# Patient Record
Sex: Female | Born: 1950
Health system: Southern US, Community
[De-identification: ages and names within clinical notes are randomized; demographics above are authoritative.]

## PROBLEM LIST (undated history)

## (undated) DIAGNOSIS — B1910 Unspecified viral hepatitis B without hepatic coma: Secondary | ICD-10-CM

## (undated) DIAGNOSIS — R011 Cardiac murmur, unspecified: Secondary | ICD-10-CM

## (undated) DIAGNOSIS — R0989 Other specified symptoms and signs involving the circulatory and respiratory systems: Secondary | ICD-10-CM

## (undated) DIAGNOSIS — E785 Hyperlipidemia, unspecified: Secondary | ICD-10-CM

## (undated) DIAGNOSIS — Z5189 Encounter for other specified aftercare: Secondary | ICD-10-CM

## (undated) DIAGNOSIS — M1990 Unspecified osteoarthritis, unspecified site: Secondary | ICD-10-CM

## (undated) DIAGNOSIS — B019 Varicella without complication: Secondary | ICD-10-CM

## (undated) DIAGNOSIS — T7840XA Allergy, unspecified, initial encounter: Secondary | ICD-10-CM

## (undated) DIAGNOSIS — Z9889 Other specified postprocedural states: Secondary | ICD-10-CM

## (undated) DIAGNOSIS — S62121A Displaced fracture of lunate [semilunar], right wrist, initial encounter for closed fracture: Secondary | ICD-10-CM

## (undated) DIAGNOSIS — A6000 Herpesviral infection of urogenital system, unspecified: Secondary | ICD-10-CM

## (undated) DIAGNOSIS — F1910 Other psychoactive substance abuse, uncomplicated: Secondary | ICD-10-CM

## (undated) DIAGNOSIS — K219 Gastro-esophageal reflux disease without esophagitis: Secondary | ICD-10-CM

## (undated) DIAGNOSIS — K859 Acute pancreatitis without necrosis or infection, unspecified: Secondary | ICD-10-CM

## (undated) DIAGNOSIS — N200 Calculus of kidney: Secondary | ICD-10-CM

## (undated) DIAGNOSIS — E039 Hypothyroidism, unspecified: Secondary | ICD-10-CM

## (undated) DIAGNOSIS — H269 Unspecified cataract: Secondary | ICD-10-CM

## (undated) DIAGNOSIS — R112 Nausea with vomiting, unspecified: Secondary | ICD-10-CM

## (undated) HISTORY — DX: Calculus of kidney: N20.0

## (undated) HISTORY — DX: Gastro-esophageal reflux disease without esophagitis: K21.9

## (undated) HISTORY — DX: Hyperlipidemia, unspecified: E78.5

## (undated) HISTORY — PX: OVARIAN CYST SURGERY: SHX726

## (undated) HISTORY — PX: OTHER SURGICAL HISTORY: SHX169

## (undated) HISTORY — DX: Hypothyroidism, unspecified: E03.9

## (undated) HISTORY — DX: Herpesviral infection of urogenital system, unspecified: A60.00

## (undated) HISTORY — DX: Encounter for other specified aftercare: Z51.89

## (undated) HISTORY — DX: Unspecified osteoarthritis, unspecified site: M19.90

## (undated) HISTORY — DX: Acute pancreatitis without necrosis or infection, unspecified: K85.90

## (undated) HISTORY — PX: FRACTURE SURGERY: SHX138

## (undated) HISTORY — DX: Cardiac murmur, unspecified: R01.1

## (undated) HISTORY — PX: LAPAROSCOPY: SHX197

## (undated) HISTORY — DX: Unspecified viral hepatitis B without hepatic coma: B19.10

## (undated) HISTORY — DX: Unspecified cataract: H26.9

## (undated) HISTORY — DX: Other specified symptoms and signs involving the circulatory and respiratory systems: R09.89

## (undated) HISTORY — PX: EYE SURGERY: SHX253

## (undated) HISTORY — DX: Other psychoactive substance abuse, uncomplicated: F19.10

## (undated) HISTORY — PX: TONSILLECTOMY: SUR1361

## (undated) HISTORY — DX: Allergy, unspecified, initial encounter: T78.40XA

## (undated) HISTORY — DX: Varicella without complication: B01.9

---

## 1970-05-15 HISTORY — PX: APPENDECTOMY: SHX54

## 2015-05-16 DIAGNOSIS — K5730 Diverticulosis of large intestine without perforation or abscess without bleeding: Secondary | ICD-10-CM

## 2015-05-16 HISTORY — DX: Diverticulosis of large intestine without perforation or abscess without bleeding: K57.30

## 2015-05-24 DIAGNOSIS — R110 Nausea: Secondary | ICD-10-CM | POA: Diagnosis not present

## 2015-05-24 DIAGNOSIS — R1011 Right upper quadrant pain: Secondary | ICD-10-CM | POA: Diagnosis not present

## 2015-06-16 DIAGNOSIS — M25511 Pain in right shoulder: Secondary | ICD-10-CM | POA: Diagnosis not present

## 2015-06-16 DIAGNOSIS — G8929 Other chronic pain: Secondary | ICD-10-CM | POA: Diagnosis not present

## 2015-07-01 DIAGNOSIS — M25511 Pain in right shoulder: Secondary | ICD-10-CM | POA: Diagnosis not present

## 2015-07-07 DIAGNOSIS — M25511 Pain in right shoulder: Secondary | ICD-10-CM | POA: Diagnosis not present

## 2015-07-12 DIAGNOSIS — M25511 Pain in right shoulder: Secondary | ICD-10-CM | POA: Diagnosis not present

## 2015-07-22 DIAGNOSIS — G8929 Other chronic pain: Secondary | ICD-10-CM | POA: Diagnosis not present

## 2015-07-22 DIAGNOSIS — M25511 Pain in right shoulder: Secondary | ICD-10-CM | POA: Diagnosis not present

## 2015-07-23 DIAGNOSIS — N390 Urinary tract infection, site not specified: Secondary | ICD-10-CM | POA: Diagnosis not present

## 2015-07-28 DIAGNOSIS — G8929 Other chronic pain: Secondary | ICD-10-CM | POA: Diagnosis not present

## 2015-07-28 DIAGNOSIS — M25511 Pain in right shoulder: Secondary | ICD-10-CM | POA: Diagnosis not present

## 2015-08-04 DIAGNOSIS — M25511 Pain in right shoulder: Secondary | ICD-10-CM | POA: Diagnosis not present

## 2015-08-04 DIAGNOSIS — G8929 Other chronic pain: Secondary | ICD-10-CM | POA: Diagnosis not present

## 2015-08-06 DIAGNOSIS — R3129 Other microscopic hematuria: Secondary | ICD-10-CM | POA: Diagnosis not present

## 2015-08-06 DIAGNOSIS — H25813 Combined forms of age-related cataract, bilateral: Secondary | ICD-10-CM | POA: Diagnosis not present

## 2015-08-06 DIAGNOSIS — N390 Urinary tract infection, site not specified: Secondary | ICD-10-CM | POA: Diagnosis not present

## 2015-08-06 DIAGNOSIS — H3540 Unspecified peripheral retinal degeneration: Secondary | ICD-10-CM | POA: Diagnosis not present

## 2015-08-11 DIAGNOSIS — M25511 Pain in right shoulder: Secondary | ICD-10-CM | POA: Diagnosis not present

## 2015-08-11 DIAGNOSIS — G8929 Other chronic pain: Secondary | ICD-10-CM | POA: Diagnosis not present

## 2015-08-12 DIAGNOSIS — N200 Calculus of kidney: Secondary | ICD-10-CM | POA: Diagnosis not present

## 2015-08-12 DIAGNOSIS — K5730 Diverticulosis of large intestine without perforation or abscess without bleeding: Secondary | ICD-10-CM | POA: Diagnosis not present

## 2015-08-12 DIAGNOSIS — I7090 Unspecified atherosclerosis: Secondary | ICD-10-CM | POA: Diagnosis not present

## 2015-08-12 DIAGNOSIS — R918 Other nonspecific abnormal finding of lung field: Secondary | ICD-10-CM | POA: Diagnosis not present

## 2015-08-12 DIAGNOSIS — R319 Hematuria, unspecified: Secondary | ICD-10-CM | POA: Diagnosis not present

## 2015-08-16 DIAGNOSIS — H25813 Combined forms of age-related cataract, bilateral: Secondary | ICD-10-CM | POA: Diagnosis not present

## 2015-08-19 DIAGNOSIS — R3129 Other microscopic hematuria: Secondary | ICD-10-CM | POA: Diagnosis not present

## 2015-08-19 DIAGNOSIS — N390 Urinary tract infection, site not specified: Secondary | ICD-10-CM | POA: Diagnosis not present

## 2015-08-26 DIAGNOSIS — H259 Unspecified age-related cataract: Secondary | ICD-10-CM | POA: Diagnosis not present

## 2015-08-26 DIAGNOSIS — Z01812 Encounter for preprocedural laboratory examination: Secondary | ICD-10-CM | POA: Diagnosis not present

## 2015-08-26 DIAGNOSIS — Z01818 Encounter for other preprocedural examination: Secondary | ICD-10-CM | POA: Diagnosis not present

## 2015-08-26 DIAGNOSIS — H25813 Combined forms of age-related cataract, bilateral: Secondary | ICD-10-CM | POA: Diagnosis not present

## 2015-09-01 DIAGNOSIS — Z91030 Bee allergy status: Secondary | ICD-10-CM | POA: Diagnosis not present

## 2015-09-01 DIAGNOSIS — N200 Calculus of kidney: Secondary | ICD-10-CM | POA: Diagnosis not present

## 2015-09-01 DIAGNOSIS — Z91041 Radiographic dye allergy status: Secondary | ICD-10-CM | POA: Diagnosis not present

## 2015-09-01 DIAGNOSIS — Z8619 Personal history of other infectious and parasitic diseases: Secondary | ICD-10-CM | POA: Diagnosis not present

## 2015-09-01 DIAGNOSIS — E785 Hyperlipidemia, unspecified: Secondary | ICD-10-CM | POA: Diagnosis not present

## 2015-09-01 DIAGNOSIS — Z79899 Other long term (current) drug therapy: Secondary | ICD-10-CM | POA: Diagnosis not present

## 2015-09-01 DIAGNOSIS — K219 Gastro-esophageal reflux disease without esophagitis: Secondary | ICD-10-CM | POA: Diagnosis not present

## 2015-09-01 DIAGNOSIS — E039 Hypothyroidism, unspecified: Secondary | ICD-10-CM | POA: Diagnosis not present

## 2015-09-01 DIAGNOSIS — H269 Unspecified cataract: Secondary | ICD-10-CM | POA: Diagnosis not present

## 2015-09-01 DIAGNOSIS — H25813 Combined forms of age-related cataract, bilateral: Secondary | ICD-10-CM | POA: Diagnosis not present

## 2015-09-02 DIAGNOSIS — Z48810 Encounter for surgical aftercare following surgery on the sense organs: Secondary | ICD-10-CM | POA: Diagnosis not present

## 2015-09-08 DIAGNOSIS — Z79899 Other long term (current) drug therapy: Secondary | ICD-10-CM | POA: Diagnosis not present

## 2015-09-08 DIAGNOSIS — N200 Calculus of kidney: Secondary | ICD-10-CM | POA: Diagnosis not present

## 2015-09-08 DIAGNOSIS — Z780 Asymptomatic menopausal state: Secondary | ICD-10-CM | POA: Diagnosis not present

## 2015-09-08 DIAGNOSIS — Z91041 Radiographic dye allergy status: Secondary | ICD-10-CM | POA: Diagnosis not present

## 2015-09-08 DIAGNOSIS — K219 Gastro-esophageal reflux disease without esophagitis: Secondary | ICD-10-CM | POA: Diagnosis not present

## 2015-09-08 DIAGNOSIS — H269 Unspecified cataract: Secondary | ICD-10-CM | POA: Diagnosis not present

## 2015-09-08 DIAGNOSIS — E785 Hyperlipidemia, unspecified: Secondary | ICD-10-CM | POA: Diagnosis not present

## 2015-09-08 DIAGNOSIS — E039 Hypothyroidism, unspecified: Secondary | ICD-10-CM | POA: Diagnosis not present

## 2015-09-08 DIAGNOSIS — H25812 Combined forms of age-related cataract, left eye: Secondary | ICD-10-CM | POA: Diagnosis not present

## 2015-09-08 DIAGNOSIS — Z91030 Bee allergy status: Secondary | ICD-10-CM | POA: Diagnosis not present

## 2015-09-09 DIAGNOSIS — Z48810 Encounter for surgical aftercare following surgery on the sense organs: Secondary | ICD-10-CM | POA: Diagnosis not present

## 2015-09-16 DIAGNOSIS — Z48810 Encounter for surgical aftercare following surgery on the sense organs: Secondary | ICD-10-CM | POA: Diagnosis not present

## 2015-10-14 DIAGNOSIS — Z48810 Encounter for surgical aftercare following surgery on the sense organs: Secondary | ICD-10-CM | POA: Diagnosis not present

## 2015-10-25 DIAGNOSIS — Z1159 Encounter for screening for other viral diseases: Secondary | ICD-10-CM | POA: Diagnosis not present

## 2015-10-25 DIAGNOSIS — Z136 Encounter for screening for cardiovascular disorders: Secondary | ICD-10-CM | POA: Diagnosis not present

## 2015-10-25 DIAGNOSIS — Z0000 Encounter for general adult medical examination without abnormal findings: Secondary | ICD-10-CM | POA: Diagnosis not present

## 2015-11-01 DIAGNOSIS — Z1159 Encounter for screening for other viral diseases: Secondary | ICD-10-CM | POA: Diagnosis not present

## 2015-11-01 DIAGNOSIS — Z136 Encounter for screening for cardiovascular disorders: Secondary | ICD-10-CM | POA: Diagnosis not present

## 2015-11-01 DIAGNOSIS — Z1211 Encounter for screening for malignant neoplasm of colon: Secondary | ICD-10-CM | POA: Diagnosis not present

## 2015-11-01 DIAGNOSIS — E039 Hypothyroidism, unspecified: Secondary | ICD-10-CM | POA: Diagnosis not present

## 2015-11-24 DIAGNOSIS — M1990 Unspecified osteoarthritis, unspecified site: Secondary | ICD-10-CM | POA: Diagnosis not present

## 2015-11-24 DIAGNOSIS — E039 Hypothyroidism, unspecified: Secondary | ICD-10-CM | POA: Diagnosis not present

## 2015-11-24 DIAGNOSIS — E785 Hyperlipidemia, unspecified: Secondary | ICD-10-CM | POA: Diagnosis not present

## 2015-11-24 DIAGNOSIS — K219 Gastro-esophageal reflux disease without esophagitis: Secondary | ICD-10-CM | POA: Diagnosis not present

## 2016-02-02 DIAGNOSIS — Z23 Encounter for immunization: Secondary | ICD-10-CM | POA: Diagnosis not present

## 2016-05-23 LAB — CBC AND DIFFERENTIAL
HEMATOCRIT: 43 % (ref 36–46)
HEMOGLOBIN: 14.2 g/dL (ref 12.0–16.0)
WBC: 4.4 10*3/mL

## 2016-05-23 LAB — BASIC METABOLIC PANEL
BUN: 9 mg/dL (ref 4–21)
Creatinine: 0.6 mg/dL (ref 0.5–1.1)
Potassium: 4.2 mmol/L (ref 3.4–5.3)
Sodium: 140 mmol/L (ref 137–147)

## 2016-05-23 LAB — TSH: TSH: 1.18 u[IU]/mL (ref 0.41–5.90)

## 2016-05-23 LAB — LIPID PANEL
CHOLESTEROL: 215 mg/dL — AB (ref 0–200)
HDL: 72 mg/dL — AB (ref 35–70)
LDL Cholesterol: 106 mg/dL
Triglycerides: 162 mg/dL — AB (ref 40–160)

## 2016-05-24 ENCOUNTER — Telehealth: Payer: Self-pay | Admitting: Family Medicine

## 2016-05-24 NOTE — Telephone Encounter (Signed)
Rec'd from [facility] forward 28 pages to Dr.Copland

## 2016-06-11 ENCOUNTER — Telehealth: Payer: Self-pay | Admitting: Family Medicine

## 2016-06-11 DIAGNOSIS — B009 Herpesviral infection, unspecified: Secondary | ICD-10-CM | POA: Insufficient documentation

## 2016-06-11 DIAGNOSIS — M1990 Unspecified osteoarthritis, unspecified site: Secondary | ICD-10-CM

## 2016-06-11 DIAGNOSIS — E785 Hyperlipidemia, unspecified: Secondary | ICD-10-CM | POA: Insufficient documentation

## 2016-06-11 DIAGNOSIS — K219 Gastro-esophageal reflux disease without esophagitis: Secondary | ICD-10-CM | POA: Insufficient documentation

## 2016-06-11 DIAGNOSIS — E039 Hypothyroidism, unspecified: Secondary | ICD-10-CM

## 2016-06-11 HISTORY — DX: Hypothyroidism, unspecified: E03.9

## 2016-06-11 HISTORY — DX: Unspecified osteoarthritis, unspecified site: M19.90

## 2016-06-11 HISTORY — DX: Herpesviral infection, unspecified: B00.9

## 2016-06-11 NOTE — Telephone Encounter (Signed)
Received her past medical records from [facility] [place] [facility]

## 2016-06-13 ENCOUNTER — Encounter: Payer: Self-pay | Admitting: Family Medicine

## 2016-06-15 ENCOUNTER — Ambulatory Visit (INDEPENDENT_AMBULATORY_CARE_PROVIDER_SITE_OTHER): Payer: Medicare Other | Admitting: Family Medicine

## 2016-06-15 ENCOUNTER — Encounter: Payer: Self-pay | Admitting: Family Medicine

## 2016-06-15 ENCOUNTER — Other Ambulatory Visit: Payer: Self-pay | Admitting: Emergency Medicine

## 2016-06-15 VITALS — BP 124/82 | HR 82 | Temp 98.1°F | Ht 60.0 in | Wt 157.2 lb

## 2016-06-15 DIAGNOSIS — R300 Dysuria: Secondary | ICD-10-CM | POA: Diagnosis not present

## 2016-06-15 DIAGNOSIS — R52 Pain, unspecified: Secondary | ICD-10-CM

## 2016-06-15 DIAGNOSIS — Z130 Encounter for screening for diseases of the blood and blood-forming organs and certain disorders involving the immune mechanism: Secondary | ICD-10-CM

## 2016-06-15 DIAGNOSIS — N814 Uterovaginal prolapse, unspecified: Secondary | ICD-10-CM

## 2016-06-15 DIAGNOSIS — Z5181 Encounter for therapeutic drug level monitoring: Secondary | ICD-10-CM

## 2016-06-15 DIAGNOSIS — Z131 Encounter for screening for diabetes mellitus: Secondary | ICD-10-CM

## 2016-06-15 DIAGNOSIS — B009 Herpesviral infection, unspecified: Secondary | ICD-10-CM

## 2016-06-15 DIAGNOSIS — K219 Gastro-esophageal reflux disease without esophagitis: Secondary | ICD-10-CM

## 2016-06-15 DIAGNOSIS — E034 Atrophy of thyroid (acquired): Secondary | ICD-10-CM

## 2016-06-15 DIAGNOSIS — E1169 Type 2 diabetes mellitus with other specified complication: Secondary | ICD-10-CM

## 2016-06-15 DIAGNOSIS — E785 Hyperlipidemia, unspecified: Secondary | ICD-10-CM

## 2016-06-15 LAB — COMPREHENSIVE METABOLIC PANEL
ALBUMIN: 4 g/dL (ref 3.5–5.2)
ALT: 18 U/L (ref 0–35)
AST: 24 U/L (ref 0–37)
Alkaline Phosphatase: 63 U/L (ref 39–117)
BILIRUBIN TOTAL: 0.3 mg/dL (ref 0.2–1.2)
BUN: 9 mg/dL (ref 6–23)
CALCIUM: 9.5 mg/dL (ref 8.4–10.5)
CO2: 29 meq/L (ref 19–32)
CREATININE: 0.68 mg/dL (ref 0.40–1.20)
Chloride: 100 mEq/L (ref 96–112)
GFR: 91.51 mL/min (ref >60.00)
Glucose, Bld: 103 mg/dL — ABNORMAL HIGH (ref 70–99)
Potassium: 3.5 mEq/L (ref 3.5–5.1)
Sodium: 138 mEq/L (ref 135–145)
Total Protein: 6.9 g/dL (ref 6.0–8.3)

## 2016-06-15 LAB — TSH: TSH: 1.69 u[IU]/mL (ref 0.35–4.50)

## 2016-06-15 LAB — POCT URINALYSIS DIP (MANUAL ENTRY)
BILIRUBIN UA: NEGATIVE
Bilirubin, UA: NEGATIVE
Glucose, UA: NEGATIVE
Nitrite, UA: NEGATIVE
PH UA: 6
PROTEIN UA: NEGATIVE
SPEC GRAV UA: 1.02
UROBILINOGEN UA: NEGATIVE

## 2016-06-15 LAB — CBC
HCT: 36.6 % (ref 36.0–46.0)
HEMOGLOBIN: 12.6 g/dL (ref 12.0–15.0)
MCHC: 34.4 g/dL (ref 30.0–36.0)
MCV: 95.7 fl (ref 78.0–100.0)
PLATELETS: 424 10*3/uL — AB (ref 150.0–400.0)
RBC: 3.82 Mil/uL — AB (ref 3.87–5.11)
RDW: 11.7 % (ref 11.5–15.5)
WBC: 6 10*3/uL (ref 4.0–10.5)

## 2016-06-15 MED ORDER — ATORVASTATIN CALCIUM 40 MG PO TABS: 40.0000 mg | ORAL_TABLET | Freq: Every day | ORAL | 3 refills | Status: DC

## 2016-06-15 MED ORDER — LEVOTHYROXINE SODIUM 88 MCG PO CAPS: 1.0000 | ORAL_CAPSULE | Freq: Every day | ORAL | 3 refills | Status: DC

## 2016-06-15 MED ORDER — IBUPROFEN 400 MG PO TABS: 400.0000 mg | ORAL_TABLET | Freq: Four times a day (QID) | ORAL | 1 refills | Status: DC | PRN

## 2016-06-15 MED ORDER — CIPROFLOXACIN HCL 500 MG PO TABS: 500.0000 mg | ORAL_TABLET | Freq: Two times a day (BID) | ORAL | 1 refills | Status: DC

## 2016-06-15 MED ORDER — ACYCLOVIR 400 MG PO TABS: 400.0000 mg | ORAL_TABLET | Freq: Two times a day (BID) | ORAL | 3 refills | Status: DC

## 2016-06-15 MED ORDER — OMEPRAZOLE 40 MG PO CPDR: 40.0000 mg | DELAYED_RELEASE_CAPSULE | Freq: Every day | ORAL | 3 refills | Status: DC

## 2016-06-15 MED ORDER — LEVOTHYROXINE SODIUM 88 MCG PO TABS: 88.0000 ug | ORAL_TABLET | Freq: Every day | ORAL | 1 refills | Status: DC

## 2016-06-15 NOTE — Progress Notes (Signed)
Pre visit review using our clinic review tool, if applicable. No additional management support is needed unless otherwise documented below in the visit note. 

## 2016-06-15 NOTE — Progress Notes (Signed)
[facility] [address], [address] [place], [place] [address] [phone]  Date:  06/15/2016   Name:  [name]   DOB:  02-14-1951   MRN:  [id number]  PCP:  Abbe Amsterdam, MD    Chief Complaint: Establish Care (Pt here to est care. )   History of Present Illness:  [name] is a 66 y.o. very pleasant female patient who presents with the following:  Here today as a new patient- recently moved here from [place], [place] She moved to this area in August.  She quit a job and "moved down here in a day" because a friend's husband got very ill.  She has been in the hospital a lot helping out. The husband is now home- he is now a double amputee from severe DM.   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  She does have a prolapsed uterus- she does not want surgery for this but she would like to consider getting a pessary. She would like to see GYN for her prolapse to discuss non- surgical treatment She has noted strong smelling urine for 4-5 days. No blood noted in her urine  She did have a TSH close to a year ago-   She had a CT of her chest in late 2016 which did recommend follow-up for 2 small nodules. However she is declining this follow-up for the time being due to financial concerns. She will let me know if and when she wishes to have follow-up done No cough or SOB noted  Patient Active Problem List   Diagnosis Date Noted  . GERD (gastroesophageal reflux disease) 06/11/2016  . Osteoarthritis 06/11/2016  . Hypothyroidism (acquired) 06/11/2016  . Dyslipidemia 06/11/2016  . HSV-2 (herpes  simplex virus 2) infection 06/11/2016    Past Medical History:  Diagnosis Date  . Arthritis   . Chicken pox   . Dyslipidemia   . Genital herpes   . GERD (gastroesophageal reflux disease)   . Hepatitis B   . Hypothyroidism   . Kidney stone   . Pancreatitis     Past Surgical History:  Procedure Laterality Date  . APPENDECTOMY  1972  . LAPAROSCOPY    . OVARIAN CYST SURGERY    . TONSILLECTOMY      Social History  Substance Use Topics  . Smoking status: Former Games developer  . Smokeless tobacco: Never Used  . Alcohol use No    Family History  Problem Relation Age of Onset  . Alcoholism Mother   . Kidney disease Mother   . Kidney Stones Mother   . Liver cancer Mother   . Alcoholism Father   . Hyperlipidemia Father   . Heart disease Father   . Heart failure Father   . Alcoholism Maternal Grandfather     Allergies  Allergen Reactions  . Contrast Media [Iodinated Diagnostic Agents]     Medication list has been reviewed and updated.  No current outpatient prescriptions on file prior to visit.   No current facility-administered medications on file prior to visit.     Review of Systems:  As  per HPI- otherwise negative.   Physical Examination: Vitals:   06/15/16 1318  BP: 124/82  Pulse: 82  Temp: 98.1 F (36.7 C)   Vitals:   06/15/16 1318  Weight: 157 lb 3.2 oz (71.3 kg)  Height: 5' (1.524 m)   Body mass index is 30.7 kg/m. Ideal Body Weight: Weight in (lb) to have BMI = 25: 127.7  GEN: WDWN, NAD, Non-toxic, A & O x 3,overweight, looks well HEENT: Atraumatic, Normocephalic. Neck supple. No masses, No LAD.  Bilateral TM wnl, oropharynx normal.  PEERL,EOMI.   Ears and Nose: No external deformity. CV: RRR, No M/G/R. No JVD. No thrill. No extra heart sounds. PULM: CTA B, no wheezes, crackles, rhonchi. No retractions. No resp. distress. No accessory muscle use. ABD: S, NT, ND, No rebound. No HSM. EXTR: No c/c/e NEURO Normal gait.  PSYCH: Normally  interactive. Conversant. Not depressed or anxious appearing.  Calm demeanor.   Results for orders placed or performed in visit on 06/15/16  CBC  Result Value Ref Range   WBC 6.0 4.0 - 10.5 K/uL   RBC 3.82 (L) 3.87 - 5.11 Mil/uL   Platelets 424.0 (H) 150.0 - 400.0 K/uL   Hemoglobin 12.6 12.0 - 15.0 g/dL   HCT 16.1 09.6 - 04.5 %   MCV 95.7 78.0 - 100.0 fl   MCHC 34.4 30.0 - 36.0 g/dL   RDW 40.9 81.1 - 91.4 %  Comprehensive metabolic panel  Result Value Ref Range   Sodium 138 135 - 145 mEq/L   Potassium 3.5 3.5 - 5.1 mEq/L   Chloride 100 96 - 112 mEq/L   CO2 29 19 - 32 mEq/L   Glucose, Bld 103 (H) 70 - 99 mg/dL   BUN 9 6 - 23 mg/dL   Creatinine, Ser 7.82 0.40 - 1.20 mg/dL   Total Bilirubin 0.3 0.2 - 1.2 mg/dL   Alkaline Phosphatase 63 39 - 117 U/L   AST 24 0 - 37 U/L   ALT 18 0 - 35 U/L   Total Protein 6.9 6.0 - 8.3 g/dL   Albumin 4.0 3.5 - 5.2 g/dL   Calcium 9.5 8.4 - 95.6 mg/dL   GFR 21.30 >86.57 mL/min  TSH  Result Value Ref Range   TSH 1.69 0.35 - 4.50 uIU/mL  POCT urinalysis dipstick  Result Value Ref Range   Color, UA yellow yellow   Clarity, UA clear clear   Glucose, UA negative negative   Bilirubin, UA negative negative   Ketones, POC UA negative negative   Spec Grav, UA 1.020    Blood, UA small (A) negative   pH, UA 6.0    Protein Ur, POC negative negative   Urobilinogen, UA negative    Nitrite, UA Negative Negative   Leukocytes, UA large (3+) (A) Negative    Assessment and Plan: Hypothyroidism due to acquired atrophy of thyroid - Plan: TSH, DISCONTINUED: Levothyroxine Sodium 88 MCG CAPS  Dysuria - Plan: POCT urinalysis dipstick, Urine culture, ciprofloxacin (CIPRO) 500 MG tablet  Uterine prolapse - Plan: Ambulatory referral to Obstetrics / Gynecology  Hyperlipidemia associated with type 2 diabetes mellitus (HCC) - Plan: atorvastatin (LIPITOR) 40 MG tablet  Screening for deficiency anemia - Plan: Comprehensive metabolic panel  Screening for diabetes  mellitus - Plan: CBC  Medication monitoring encounter - Plan: CBC  Body aches - Plan: ibuprofen (ADVIL,MOTRIN) 400 MG tablet  HSV (herpes simplex virus) infection - Plan: acyclovir (ZOVIRAX) 400 MG tablet  Gastroesophageal reflux disease, esophagitis presence not specified - Plan:  omeprazole (PRILOSEC) 40 MG capsule  Here today as a new patient Referral to OBG for her uterine prolapse Her urine today does suggest a UTI.  Per pt she always uses cipro with success- will give her 5 day course today and await culture Refilled other meds as above Labs today except for lipids and she is not fasing and reports a heavy lunch today   Signed Abbe AmsterdamJessica Copland, MD

## 2016-06-15 NOTE — Patient Instructions (Addendum)
It was very nice to meet you today!   We will be in touch with your blood work and urine culture You can take the cipro for UTI- let me know if you are not getting better in this regard We will also get you in to see OBG about your uterine prolapse

## 2016-06-17 LAB — URINE CULTURE

## 2016-06-18 ENCOUNTER — Encounter: Payer: Self-pay | Admitting: Family Medicine

## 2016-07-21 ENCOUNTER — Encounter: Payer: Medicare Other | Admitting: Obstetrics & Gynecology

## 2016-07-27 ENCOUNTER — Encounter: Payer: Self-pay | Admitting: Obstetrics & Gynecology

## 2016-07-27 ENCOUNTER — Ambulatory Visit (INDEPENDENT_AMBULATORY_CARE_PROVIDER_SITE_OTHER): Payer: Medicare Other | Admitting: Obstetrics & Gynecology

## 2016-07-27 ENCOUNTER — Other Ambulatory Visit (HOSPITAL_COMMUNITY)
Admission: RE | Admit: 2016-07-27 | Discharge: 2016-07-27 | Disposition: A | Discharge status: Home / Self Care | Payer: Medicare Other | Source: Ambulatory Visit | Attending: Obstetrics & Gynecology | Admitting: Obstetrics & Gynecology

## 2016-07-27 VITALS — BP 130/72 | HR 80 | Ht 60.0 in | Wt 159.0 lb

## 2016-07-27 DIAGNOSIS — Z1151 Encounter for screening for human papillomavirus (HPV): Secondary | ICD-10-CM | POA: Insufficient documentation

## 2016-07-27 DIAGNOSIS — N814 Uterovaginal prolapse, unspecified: Secondary | ICD-10-CM | POA: Diagnosis not present

## 2016-07-27 DIAGNOSIS — Z124 Encounter for screening for malignant neoplasm of cervix: Secondary | ICD-10-CM | POA: Diagnosis not present

## 2016-07-27 DIAGNOSIS — Z01419 Encounter for gynecological examination (general) (routine) without abnormal findings: Secondary | ICD-10-CM | POA: Diagnosis not present

## 2016-07-27 NOTE — Progress Notes (Addendum)
History:  66 y.o. G2P1011 here today for eval of prolapse.Pt is s/p SVD x 1 and EAB x 1.   Pt had noted it 3 years prev. She reports that it has gotten worse and it gets dry and keesp her form having a BM or to void.  She feels that she is getting UTIs now as a results of getting exposure or touching the anus.  She does not want surgery. Pt works and does not have time for suret. Pt is interested in a pessary. Pt ios not sexually active.   She feels discomfort with it pulling down.  She does report incontinence. She does NOT wish to have surgery and would like to get fitted for a pessary.   The following portions of the patient's history were reviewed and updated as appropriate: allergies, current medications, past family history, past medical history, past social history, past surgical history and problem list.  Review of Systems:  Pertinent items are noted in HPI.   Objective:  Physical Exam Blood pressure 130/72, pulse 80, height 5' (1.524 m), weight 159 lb (72.1 kg). BP 130/72   Pulse 80   Ht 5' (1.524 m)   Wt 159 lb (72.1 kg)   BMI 31.05 kg/m  CONSTITUTIONAL: Well-developed, well-nourished female in no acute distress.  HENT:  Normocephalic, atraumatic EYES: Conjunctivae and EOM are normal. No scleral icterus.  NECK: Normal range of motion SKIN: Skin is warm and dry. No rash noted. Not diaphoretic.No pallor. NEUROLGIC: Alert and oriented to person, place, and time. Normal coordination.  Pelvic: Normal appearing external genitalia; normal appearing vaginal mucosa and cervix.  Normal discharge. Cervix benign. PAP obtained. The uterus is small and prolapsed grade III uterine prolapse.  There is leakage of urine with valsalva.  PESSARY FITTING AND PLACEMENT During pelvic exam, patient was examined. Her vaginal introitus size, vaginal length, and prolapse stage were used to guide selection of pessary type and size. After evaluation, a size 4 ring pessary with urethral support was placed and  patient walked around room with it in place.  No prolapse was noted in standing, or in lithotomy position even after Valsalva.  The fitter was removed.    Assessment & Plan:  Uterine prolapse  Pessary fitting  Pessary ordered We will contact pt once pessary arrives f/u PAP   Vernis L. Harraway-Smith, M.D., [facility]

## 2016-08-02 LAB — CYTOLOGY - PAP
ADEQUACY: ABSENT
DIAGNOSIS: NEGATIVE
HPV (WINDOPATH): NOT DETECTED

## 2016-08-14 ENCOUNTER — Encounter: Payer: Self-pay | Admitting: Obstetrics & Gynecology

## 2016-08-21 ENCOUNTER — Encounter: Payer: Self-pay | Admitting: Obstetrics & Gynecology

## 2016-08-21 ENCOUNTER — Ambulatory Visit (INDEPENDENT_AMBULATORY_CARE_PROVIDER_SITE_OTHER): Payer: Medicare Other | Admitting: Obstetrics & Gynecology

## 2016-08-21 VITALS — BP 118/57 | HR 78 | Ht 60.0 in | Wt 157.0 lb

## 2016-08-21 DIAGNOSIS — N814 Uterovaginal prolapse, unspecified: Secondary | ICD-10-CM | POA: Diagnosis not present

## 2016-08-21 NOTE — Patient Instructions (Signed)
Pelvic Organ Prolapse Pelvic organ prolapse is the stretching, bulging, or dropping of pelvic organs into an abnormal position. It happens when the muscles and tissues that surround and support pelvic structures are stretched or weak. Pelvic organ prolapse can involve:  Vagina (vaginal prolapse).  Uterus (uterine prolapse).  Bladder (cystocele).  Rectum (rectocele).  Intestines (enterocele). When organs other than the vagina are involved, they often bulge into the vagina or protrude from the vagina, depending on how severe the prolapse is. What are the causes? Causes of this condition include:  Pregnancy, labor, and childbirth.  Long-lasting (chronic) cough.  Chronic constipation.  Obesity.  Past pelvic surgery.  Aging. During and after menopause, a decreased production of the hormone estrogen can weaken pelvic ligaments and muscles.  Consistently lifting more than 50 lb (23 kg).  Buildup of fluid in the abdomen due to certain diseases and other conditions. What are the signs or symptoms? Symptoms of this condition include:  Loss of bladder control when you cough, sneeze, strain, and exercise (stress incontinence). This may be worse immediately following childbirth, and it may gradually improve over time.  Feeling pressure in your pelvis or vagina. This pressure may increase when you cough or when you are having a bowel movement.  A bulge that protrudes from the opening of your vagina or against your vaginal wall. If your uterus protrudes through the opening of your vagina and rubs against your clothing, you may also experience soreness, ulcers, infection, pain, and bleeding.  Increased effort to have a bowel movement or urinate.  Pain in your low back.  Pain, discomfort, or disinterest in sexual intercourse.  Repeated bladder infections (urinary tract infections).  Difficulty inserting or inability to insert a tampon or applicator. In some people, this condition does  not cause any symptoms. How is this diagnosed? Your health care provider may perform an internal and external vaginal and rectal exam. During the exam, you may be asked to cough and strain while you are lying down, sitting, and standing up. Your health care provider will determine if other tests are required, such as bladder function tests. How is this treated? In most cases, this condition needs to be treated only if it produces symptoms. No treatment is guaranteed to correct the prolapse or relieve the symptoms completely. Treatment may include:  Lifestyle changes, such as:  Avoiding drinking beverages that contain caffeine.  Increasing your intake of high-fiber foods. This can help to decrease constipation and straining during bowel movements.  Emptying your bladder at scheduled times (bladder training therapy). This can help to reduce or avoid urinary incontinence.  Losing weight if you are overweight or obese.  Estrogen. Estrogen may help mild prolapse by increasing the strength and tone of pelvic floor muscles.  Kegel exercises. These may help mild cases of prolapse by strengthening and tightening the muscles of the pelvic floor.  Pessary insertion. A pessary is a soft, flexible device that is placed into your vagina by your health care provider to help support the vaginal walls and keep pelvic organs in place.  Surgery. This is often the only form of treatment for severe prolapse. Different types of surgeries are available. Follow these instructions at home:  Wear a sanitary pad or absorbent product if you have urinary incontinence.  Avoid heavy lifting and straining with exercise and work. Do not hold your breath when you perform mild to moderate lifting and exercise activities. Limit your activities as directed by your health care provider.  Take medicines   only as directed by your health care provider.  Perform Kegel exercises as directed by your health care provider.  If  you have a pessary, take care of it as directed by your health care provider. Contact a health care provider if:  Your symptoms interfere with your daily activities or sex life.  You need medicine to help with the discomfort.  You notice bleeding from the vagina that is not related to your period.  You have a fever.  You have pain or bleeding when you urinate.  You have bleeding when you have a bowel movement.  You lose urine when you have sex.  You have chronic constipation.  You have a pessary that falls out.  You have vaginal discharge that has a bad smell.  You have low abdominal pain or cramping that is unusual for you. This information is not intended to replace advice given to you by your health care provider. Make sure you discuss any questions you have with your health care provider. Document Released: 11/26/2013 Document Revised: 10/07/2015 Document Reviewed: 07/14/2013 Elsevier Interactive Patient Education  2017 [employer].  

## 2016-08-21 NOTE — Progress Notes (Signed)
Pessary insert today

## 2016-08-22 NOTE — Progress Notes (Addendum)
History:  66 y.o. G2P1011 here today for insertion of pessary.  She denies new problems.   The following portions of the patient's history were reviewed and updated as appropriate: allergies, current medications, past family history, past medical history, past social history, past surgical history and problem list.  Review of Systems:  Pertinent items noted in HPI and remainder of comprehensive ROS otherwise negative.   Objective:  Physical Exam Blood pressure (!) 118/57, pulse 78, height 5' (1.524 m), weight 157 lb (71.2 kg). BP (!) 118/57   Pulse 78   Ht 5' (1.524 m)   Wt 157 lb (71.2 kg)   BMI 30.66 kg/m  CONSTITUTIONAL: Well-developed, well-nourished female in no acute distress.  HENT:  Normocephalic, atraumatic EYES: Conjunctivae and EOM are normal. No scleral icterus.  NECK: Normal range of motion SKIN: Skin is warm and dry. No rash noted. Not diaphoretic.No pallor. NEUROLGIC: Alert and oriented to person, place, and time. Normal coordination.  GYN : Pelvic:Pt has a Grade III cystocele. There is mild uterine prolapse. No rectocele noted. PESSARY PLACEMENT Patient was examined. Her vaginal introitus size, vaginal length, and prolapse stage were used to guide selection of pessary type and size. After evaluation, a size 4 ring pessary with urethral support was placed and patient walked around room with it in place.  No prolapse was noted in standing, or in lithotomy position even after Valsalva.     Assessment & Plan:  Uterine prolapse- s/p placement of pessary.  f/u in 1 month or sooner prn to check pessary.  Bert L. Harraway-Smith, M.D., [facility]

## 2016-08-24 ENCOUNTER — Encounter: Payer: Self-pay | Admitting: Obstetrics & Gynecology

## 2016-09-18 ENCOUNTER — Encounter: Payer: Self-pay | Admitting: Obstetrics & Gynecology

## 2016-09-18 ENCOUNTER — Ambulatory Visit: Payer: Medicare Other | Admitting: Obstetrics & Gynecology

## 2016-09-18 ENCOUNTER — Ambulatory Visit (INDEPENDENT_AMBULATORY_CARE_PROVIDER_SITE_OTHER): Payer: Medicare Other | Admitting: Obstetrics & Gynecology

## 2016-09-18 VITALS — BP 125/75 | HR 78 | Ht 60.0 in | Wt 157.0 lb

## 2016-09-18 DIAGNOSIS — N8111 Cystocele, midline: Secondary | ICD-10-CM

## 2016-09-18 NOTE — Progress Notes (Signed)
History:  66 y.o. G2P1011 here today for f/u of cystocele. Pt attempted use of the pessarty. She reports that it worked well for the prolapse but, caused her to have dribbling. She reports that if she had a M with it in it came out. She does like to use it for prolonged standing as she does not like the prolapse. She is not interested in trying a different type of pessary at present and does not have time for surgery due to her care taking responsibilities.but she does want to consider surgery in the future.  Pt reports a h/o abd scar tissue.   The following portions of the patient's history were reviewed and updated as appropriate: allergies, current medications, past family history, past medical history, past social history, past surgical history and problem list.  Review of Systems:  Pertinent items are noted in HPI.   Objective:  Physical Exam Blood pressure 125/75, pulse 78, height 5' (1.524 m), weight 157 lb (71.2 kg). Gen: NAD Abd: Soft, nontender and nondistended. Well healed vertical incision  Pelvic:Pt has a Grade III cystocele. There is mild uterine prolapse. No rectocele noted.   Assessment & Plan:  Pelvic organ prolapse- prolapse improved with pessary but, incontinence noted. Pt does not want ot try another type of pessary.  Wants surgery in the future. Discussed with pt LAVH with Ant repair and TVT. This would need to be done in conjunction with Dr. Marice Potterove or urology.  f/u in 6 months or sooner prn Pt would like to use pessary for days where she has prolonged standing and will wear a pad for incontinence on those days.  Total face-to-face time with patient was 20 min.  Greater than 50% was spent in counseling and coordination of care with the patient.    Wanisha L. Harraway-Smith, M.D., [facility]

## 2016-09-18 NOTE — Patient Instructions (Signed)
Cystocele Repair Cystocele repair is surgery to fix a cystocele, which is a bulging, drooping area (hernia) of the bladder that extends into the vagina. This bulging occurs on the top front (anterior) wall of the vagina. The condition may also be called an anterior prolapse or a prolapsed bladder. Tell a health care provider about:  Any allergies you have.  All medicines you are taking, including vitamins, herbs, eye drops, creams, and over-the-counter medicines.  Any problems you or family members have had with anesthetic medicines.  Any blood disorders you have.  Any surgeries you have had.  Any medical conditions you have.  Whether you are pregnant or may be pregnant. What are the risks? Generally, this is a safe procedure. However, problems may occur, including:  Infection.  Too much bleeding.  Allergic reactions to medicines.  Damage to other structures or organs.  Problems with the urinary drainage tube (catheter), such as blockage.  Return of the cystocele.  Problems with the vaginal mesh. What happens before the procedure? Staying hydrated  Follow instructions from your health care provider about hydration, which may include:  Up to 2 hours before the procedure - you may continue to drink clear liquids, such as water, clear fruit juice, black coffee, and plain tea. Eating and drinking restrictions  Follow instructions from your health care provider about eating and drinking, which may include:  8 hours before the procedure - stop eating heavy meals or foods such as meat, fried foods, or fatty foods.  6 hours before the procedure - stop eating light meals or foods, such as toast or cereal.  6 hours before the procedure - stop drinking milk or drinks that contain milk.  2 hours before the procedure - stop drinking clear liquids. Medicines   Ask your health care provider about:  Changing or stopping your regular medicines. This is especially important if you  are taking diabetes medicines or blood thinners.  Taking medicines such as aspirin and ibuprofen. These medicines can thin your blood. Do not take these medicines before your procedure if your health care provider instructs you not to.  You may be given antibiotic medicine to help prevent infection. General instructions   Do not use any products that contain nicotine or tobacco-such as cigarettes and e-cigarettes-for at least 2 weeks before the procedure. If you need help quitting, ask your health care provider.  Do not drink any alcohol for 3 days before the surgery.  Plan to have someone take you home from the hospital or clinic.  Ask your health care provider how your surgical site will be marked or identified. What happens during the procedure?  To reduce your risk of infection:  Your health care team will wash or sanitize their hands.  Your skin will be washed with soap.  You will be given one or more of the following:  A medicine to make you fall asleep (general anesthetic).  A medicine that is injected into your spine to numb the area below and slightly above the injection site (spinal anesthetic).  A medicine that is injected into an area of your body to numb everything below the injection site (regional anesthetic).  A thin, flexible tube (indwelling urinary catheter) will be placed in your bladder to drain urine during and after the surgery.  The surgery will be performed through the vagina. An incision will be made in the front wall of the vagina.  The muscle between the bladder and vagina will be pulled up to its  normal position. Stitches (sutures) or a piece of mesh will be used to support the muscle and hold it in place. This will remove the hernia so the top of the bladder does not fall into the opening of the vagina.  The incision on the front wall of the vagina will then be closed with stitches that will dissolve safely into your body and do not need to be  removed. The procedure may vary among health care providers and hospitals. What happens after the procedure?  Your blood pressure, heart rate, breathing rate, and blood oxygen level will be monitored until the medicines you were given have worn off.  You will have a catheter in place to drain your bladder. This will stay in place for 2-7 days or until your bladder is working well on its own.  You may have gauze packing in the vagina. This will be removed 1-2 days after the surgery.  You will be given pain medicine as needed.  You may be given antibiotics to fight infection. This information is not intended to replace advice given to you by your health care provider. Make sure you discuss any questions you have with your health care provider. Document Released: 04/28/2000 Document Revised: 12/03/2015 Document Reviewed: 07/22/2015 Elsevier Interactive Patient Education  2017 [employer] About Cystocele  Overview  The pelvic organs, including the bladder, are normally supported by pelvic floor muscles and ligaments.  When these muscles and ligaments are stretched, weakened or torn, the wall between the bladder and the vagina sags or herniates causing a prolapse, sometimes called a cystocele.  This condition may cause discomfort and problems with emptying the bladder.  It can be present in various stages.  Some people are not aware of the changes.  Others may notice changes at the vaginal opening or a feeling of the bladder dropping outside the body.  Causes of a Cystocele  A cystocele is usually caused by muscle straining or stretching during childbirth.  In addition, cystocele is more common after menopause, because the hormone estrogen helps keep the elastic tissues around the pelvic organs strong.  A cystocele is more likely to occur when levels of estrogen decrease.  Other causes include: heavy lifting, chronic coughing, previous pelvic surgery and obesity.  Symptoms  A bladder that  has dropped from its normal position may cause: unwanted urine leakage (stress incontinence), frequent urination or urge to urinate, incomplete emptying of the bladder (not feeling bladder relief after emptying), pain or discomfort in the vagina, pelvis, groin, lower back or lower abdomen and frequent urinary tract infections.  Mild cases may not cause any symptoms.  Treatment Options  Pelvic floor (Kegel) exercises:  Strength training the muscles in your genital area  Behavioral changes: Treating and preventing constipation, taking time to empty your bladder properly, learning to lift properly and/or avoid heavy lifting when possible, stopping smoking, avoiding weight gain and treating a chronic cough or bronchitis.  A pessary: A vaginal support device is sometimes used to help pelvic support caused by muscle and ligament changes.  Surgery: Surgical repair may be necessary if symptoms cannot be managed with exercise, behavioral changes and a pessary.  Surgery is usually considered for severe cases.   2007, [employer]

## 2016-09-18 NOTE — Progress Notes (Signed)
Patient took out pessary three days after insertion. Patient states she "dribbles with it in and cant poop". Armandina StammerJennifer Howard RNBSN

## 2017-01-20 DIAGNOSIS — Z23 Encounter for immunization: Secondary | ICD-10-CM | POA: Diagnosis not present

## 2017-06-06 NOTE — Progress Notes (Addendum)
[facility] [address], [address] [place], [place] [address] [phone] [phone]  Date:  06/07/2017   Name:  Kimberly LutesCarolyn Barnes   DOB:  1950-11-18   MRN:  [id number]  PCP:  Pearline Cablesopland, Desiray Orchard C, MD    Chief Complaint: Annual Exam (Pt here for CPE. ) and Medication Refill (Pt states that she needs refills on all meds. )   History of Present Illness:  Kimberly LutesCarolyn Barnes is a 67 y.o. very pleasant female patient who presents with the following:  Here today for a CPE History of GERD, Hypothyroidism, dyslipidemia I last saw her about a year ago:  Here today as a new patient- [place] She moved to this area in August.  She quit a job and "moved down here in a day" because a friend's husband got very ill.  She has been in the hospital a lot helping out. The husband is now home- he is now a double amputee from severe DM.   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  She does have a prolapsed uterus- she does not want surgery for this but she would like to consider getting a pessary. She would like to see GYN for her prolapse to discuss non- surgical treatment  She is working [facility] at [facility] center [facility] [employer]- her BP has been running a bit high there when she checks it Her BP is running 140s/ 80s and she was not sure if this was ok  She is living with the friend who she came here to help- her husband did pass away.  This is really working out well for them both  She is fasting today No CP or SOB She had a pap last March per [facility], and it was negative, her HPV  was also negative She is not SA and does not want to continue pap screening   She did get a pessary but it does not fit that well. She is thinking about a hysterectomy, but she is not sure if she is able to take this time out just yet   No family history of colon cancer -would like to do cologuard for screening   BP Readings from Last 3 Encounters:  06/07/17 (!) 144/84  09/18/16 125/75  08/21/16 (!) 118/57     Patient Active Problem List   Diagnosis Date Noted  . GERD (gastroesophageal reflux disease) 06/11/2016  . Osteoarthritis 06/11/2016  . Hypothyroidism (acquired) 06/11/2016  . Dyslipidemia 06/11/2016  . HSV-2 (herpes simplex virus 2) infection 06/11/2016    Past Medical History:  Diagnosis Date  . Arthritis   . Chicken pox   . Dyslipidemia   . Genital herpes   . GERD (gastroesophageal reflux disease)   . Hepatitis B   . Hypothyroidism   . Kidney stone   . Pancreatitis     Past Surgical History:  Procedure Laterality Date  . APPENDECTOMY  1972  . LAPAROSCOPY    . OVARIAN CYST SURGERY    . TONSILLECTOMY      Social History  Tobacco Use  . Smoking status: Former Games developer  . Smokeless tobacco: Never Used  Substance Use Topics  . Alcohol use: No  . Drug use: No    Family History  Problem Relation Age of Onset  . Alcoholism Mother   . Kidney disease Mother   . Kidney Stones Mother   . Liver cancer Mother   . Alcoholism Father   . Hyperlipidemia Father   . Heart disease Father   . Heart failure Father   . Alcoholism Maternal Grandfather     Allergies  Allergen Reactions  . Dye Fdc Blue  [Brilliant Blue Fcf] Anaphylaxis    Dye used for CAT scans  . Contrast Media [Iodinated Diagnostic Agents]     Medication list has been reviewed and updated.  Current Outpatient Medications on File Prior to Visit  Medication Sig Dispense Refill  . ciprofloxacin (CIPRO) 500 MG tablet Take 1 tablet (500 mg total) by mouth 2 (two) times daily. Take for 5 days as  needed for UTI 10 tablet 1  . docusate sodium (COLACE) 100 MG capsule Take 100 mg by mouth daily as needed for mild constipation.    Marland Kitchen ibuprofen (ADVIL,MOTRIN) 200 MG tablet Take 200 mg by mouth every 6 (six) hours as needed.     No current facility-administered medications on file prior to visit.     Review of Systems:  As per HPI- otherwise negative.   Physical Examination: Vitals:   06/07/17 1050  BP: (!) 144/84  Pulse: 72  Temp: 98.2 F (36.8 C)  SpO2: 98%   Vitals:   06/07/17 1050  Weight: 158 lb 12.8 oz (72 kg)  Height: 5' (1.524 m)   Body mass index is 31.01 kg/m. Ideal Body Weight: Weight in (lb) to have BMI = 25: 127.7  GEN: WDWN, NAD, Non-toxic, A & O x 3, overweight, looks well  HEENT: Atraumatic, Normocephalic. Neck supple. No masses, No LAD.  Bilateral TM wnl, oropharynx normal.  PEERL,EOMI.   Ears and Nose: No external deformity. CV: RRR, No M/G/R. No JVD. No thrill. No extra heart sounds. PULM: CTA B, no wheezes, crackles, rhonchi. No retractions. No resp. distress. No accessory muscle use. ABD: S, NT, ND, +BS. No rebound. No HSM. EXTR: No c/c/e NEURO Normal gait.  PSYCH: Normally interactive. Conversant. Not depressed or anxious appearing.  Calm demeanor.    Assessment and Plan: Hypothyroidism due to acquired atrophy of thyroid - Plan: TSH  Hyperlipidemia associated with type 2 diabetes mellitus (HCC) - Plan: Lipid panel, atorvastatin (LIPITOR) 40 MG tablet  Screening for deficiency anemia - Plan: CBC  Screening for diabetes mellitus - Plan: Comprehensive metabolic panel, Hemoglobin A1c  Hyperglycemia - Plan: Hemoglobin A1c  Screening for colon cancer  Borderline blood pressure  HSV (herpes simplex virus) infection - Plan: acyclovir (ZOVIRAX) 400 MG tablet  Gastroesophageal reflux disease, esophagitis presence not specified - Plan: omeprazole (PRILOSEC) 40 MG capsule  Labs pending as above She is exercising, encourged her to continue  this She declines a bone density or mammogram She has tried to come off omeprazole but sx forced her to continue this  Will plan further follow- up pending labs.   Signed Abbe Amsterdam, MD  Received her labs- message to pt  Blood count is normal Metabolic profile is normal Cholesterol is overall favorable Thyroid is normal Your A1c (average blood sugar over the previous 3 months) is just barely at the cut off for pre-diabetes.  This means you may be at higher risk  for developing diabetes later on.  Continue to be active and we will keep an eye on this.  Please see me in about one year Results for orders placed or performed in visit on 06/07/17  CBC  Result Value Ref Range   WBC 5.3 4.0 - 10.5 K/uL   RBC 4.20 3.87 - 5.11 Mil/uL   Platelets 342.0 150.0 - 400.0 K/uL   Hemoglobin 14.1 12.0 - 15.0 g/dL   HCT 78.2 95.6 - 21.3 %   MCV 98.4 78.0 - 100.0 fl   MCHC 34.2 30.0 - 36.0 g/dL   RDW 08.6 57.8 - 46.9 %  Comprehensive metabolic panel  Result Value Ref Range   Sodium 136 135 - 145 mEq/L   Potassium 4.2 3.5 - 5.1 mEq/L   Chloride 100 96 - 112 mEq/L   CO2 28 19 - 32 mEq/L   Glucose, Bld 94 70 - 99 mg/dL   BUN 12 6 - 23 mg/dL   Creatinine, Ser 6.29 0.40 - 1.20 mg/dL   Total Bilirubin 0.5 0.2 - 1.2 mg/dL   Alkaline Phosphatase 81 39 - 117 U/L   AST 23 0 - 37 U/L   ALT 20 0 - 35 U/L   Total Protein 7.3 6.0 - 8.3 g/dL   Albumin 4.2 3.5 - 5.2 g/dL   Calcium 9.4 8.4 - 52.8 mg/dL   GFR 41.32 >44.01 mL/min  Lipid panel  Result Value Ref Range   Cholesterol 193 0 - 200 mg/dL   Triglycerides 027.2 0.0 - 149.0 mg/dL   HDL 53.66 >44.03 mg/dL   VLDL 47.4 0.0 - 25.9 mg/dL   LDL Cholesterol 563 (H) 0 - 99 mg/dL   Total CHOL/HDL Ratio 3    NonHDL 132.39   TSH  Result Value Ref Range   TSH 1.37 0.35 - 4.50 uIU/mL  Hemoglobin A1c  Result Value Ref Range   Hgb A1c MFr Bld 5.8 4.6 - 6.5 %

## 2017-06-07 ENCOUNTER — Encounter: Payer: Self-pay | Admitting: Family Medicine

## 2017-06-07 ENCOUNTER — Ambulatory Visit (INDEPENDENT_AMBULATORY_CARE_PROVIDER_SITE_OTHER): Payer: Medicare Other | Admitting: Family Medicine

## 2017-06-07 VITALS — BP 144/84 | HR 72 | Temp 98.2°F | Ht 60.0 in | Wt 158.8 lb

## 2017-06-07 DIAGNOSIS — K219 Gastro-esophageal reflux disease without esophagitis: Secondary | ICD-10-CM

## 2017-06-07 DIAGNOSIS — R030 Elevated blood-pressure reading, without diagnosis of hypertension: Secondary | ICD-10-CM

## 2017-06-07 DIAGNOSIS — E1169 Type 2 diabetes mellitus with other specified complication: Secondary | ICD-10-CM

## 2017-06-07 DIAGNOSIS — R739 Hyperglycemia, unspecified: Secondary | ICD-10-CM

## 2017-06-07 DIAGNOSIS — E785 Hyperlipidemia, unspecified: Secondary | ICD-10-CM | POA: Diagnosis not present

## 2017-06-07 DIAGNOSIS — Z131 Encounter for screening for diabetes mellitus: Secondary | ICD-10-CM

## 2017-06-07 DIAGNOSIS — Z1211 Encounter for screening for malignant neoplasm of colon: Secondary | ICD-10-CM

## 2017-06-07 DIAGNOSIS — E034 Atrophy of thyroid (acquired): Secondary | ICD-10-CM | POA: Diagnosis not present

## 2017-06-07 DIAGNOSIS — Z130 Encounter for screening for diseases of the blood and blood-forming organs and certain disorders involving the immune mechanism: Secondary | ICD-10-CM | POA: Diagnosis not present

## 2017-06-07 DIAGNOSIS — B009 Herpesviral infection, unspecified: Secondary | ICD-10-CM

## 2017-06-07 LAB — COMPREHENSIVE METABOLIC PANEL
ALK PHOS: 81 U/L (ref 39–117)
ALT: 20 U/L (ref 0–35)
AST: 23 U/L (ref 0–37)
Albumin: 4.2 g/dL (ref 3.5–5.2)
BUN: 12 mg/dL (ref 6–23)
CALCIUM: 9.4 mg/dL (ref 8.4–10.5)
CO2: 28 mEq/L (ref 19–32)
Chloride: 100 mEq/L (ref 96–112)
Creatinine, Ser: 0.67 mg/dL (ref 0.40–1.20)
GFR: 92.82 mL/min (ref >60.00)
Glucose, Bld: 94 mg/dL (ref 70–99)
POTASSIUM: 4.2 meq/L (ref 3.5–5.1)
Sodium: 136 mEq/L (ref 135–145)
TOTAL PROTEIN: 7.3 g/dL (ref 6.0–8.3)
Total Bilirubin: 0.5 mg/dL (ref 0.2–1.2)

## 2017-06-07 LAB — CBC
HEMATOCRIT: 41.3 % (ref 36.0–46.0)
HEMOGLOBIN: 14.1 g/dL (ref 12.0–15.0)
MCHC: 34.2 g/dL (ref 30.0–36.0)
MCV: 98.4 fl (ref 78.0–100.0)
PLATELETS: 342 10*3/uL (ref 150.0–400.0)
RBC: 4.2 Mil/uL (ref 3.87–5.11)
RDW: 12.3 % (ref 11.5–15.5)
WBC: 5.3 10*3/uL (ref 4.0–10.5)

## 2017-06-07 LAB — LIPID PANEL
CHOL/HDL RATIO: 3
Cholesterol: 193 mg/dL (ref 0–200)
HDL: 60.3 mg/dL (ref >39.00)
LDL Cholesterol: 108 mg/dL — ABNORMAL HIGH (ref 0–99)
NONHDL: 132.39
Triglycerides: 123 mg/dL (ref 0.0–149.0)
VLDL: 24.6 mg/dL (ref 0.0–40.0)

## 2017-06-07 LAB — TSH: TSH: 1.37 u[IU]/mL (ref 0.35–4.50)

## 2017-06-07 LAB — HEMOGLOBIN A1C: HEMOGLOBIN A1C: 5.8 % (ref 4.6–6.5)

## 2017-06-07 MED ORDER — ATORVASTATIN CALCIUM 40 MG PO TABS: 40.0000 mg | ORAL_TABLET | Freq: Every day | ORAL | 3 refills | Status: DC

## 2017-06-07 MED ORDER — LEVOTHYROXINE SODIUM 88 MCG PO TABS: 88.0000 ug | ORAL_TABLET | Freq: Every day | ORAL | 3 refills | Status: DC

## 2017-06-07 MED ORDER — OMEPRAZOLE 40 MG PO CPDR: 40.0000 mg | DELAYED_RELEASE_CAPSULE | Freq: Every day | ORAL | 3 refills | Status: DC

## 2017-06-07 MED ORDER — ACYCLOVIR 400 MG PO TABS: 400.0000 mg | ORAL_TABLET | Freq: Two times a day (BID) | ORAL | 3 refills | Status: DC

## 2017-06-07 NOTE — Patient Instructions (Signed)
It was good to see you today- take care and I will be in touch with your labs asap Continue your good work with exercise, and let me know if your blood pressure if going higher You should receive the Cologuard test at home- let me know if you do not    Health Maintenance for Postmenopausal Women Menopause is a normal process in which your reproductive ability comes to an end. This process happens gradually over a span of months to years, usually between the ages of 82 and 23. Menopause is complete when you have missed 12 consecutive menstrual periods. It is important to talk with your health care provider about some of the most common conditions that affect postmenopausal women, such as heart disease, cancer, and bone loss (osteoporosis). Adopting a healthy lifestyle and getting preventive care can help to promote your health and wellness. Those actions can also lower your chances of developing some of these common conditions. What should I know about menopause? During menopause, you may experience a number of symptoms, such as:  Moderate-to-severe hot flashes.  Night sweats.  Decrease in sex drive.  Mood swings.  Headaches.  Tiredness.  Irritability.  Memory problems.  Insomnia.  Choosing to treat or not to treat menopausal changes is an individual decision that you make with your health care provider. What should I know about hormone replacement therapy and supplements? Hormone therapy products are effective for treating symptoms that are associated with menopause, such as hot flashes and night sweats. Hormone replacement carries certain risks, especially as you become older. If you are thinking about using estrogen or estrogen with progestin treatments, discuss the benefits and risks with your health care provider. What should I know about heart disease and stroke? Heart disease, heart attack, and stroke become more likely as you age. This may be due, in part, to the hormonal  changes that your body experiences during menopause. These can affect how your body processes dietary fats, triglycerides, and cholesterol. Heart attack and stroke are both medical emergencies. There are many things that you can do to help prevent heart disease and stroke:  Have your blood pressure checked at least every 1-2 years. High blood pressure causes heart disease and increases the risk of stroke.  If you are 54-68 years old, ask your health care provider if you should take aspirin to prevent a heart attack or a stroke.  Do not use any tobacco products, including cigarettes, chewing tobacco, or electronic cigarettes. If you need help quitting, ask your health care provider.  It is important to eat a healthy diet and maintain a healthy weight. ? Be sure to include plenty of vegetables, fruits, low-fat dairy products, and lean protein. ? Avoid eating foods that are high in solid fats, added sugars, or salt (sodium).  Get regular exercise. This is one of the most important things that you can do for your health. ? Try to exercise for at least 150 minutes each week. The type of exercise that you do should increase your heart rate and make you sweat. This is known as moderate-intensity exercise. ? Try to do strengthening exercises at least twice each week. Do these in addition to the moderate-intensity exercise.  Know your numbers.Ask your health care provider to check your cholesterol and your blood glucose. Continue to have your blood tested as directed by your health care provider.  What should I know about cancer screening? There are several types of cancer. Take the following steps to reduce your  risk and to catch any cancer development as early as possible. Breast Cancer  Practice breast self-awareness. ? This means understanding how your breasts normally appear and feel. ? It also means doing regular breast self-exams. Let your health care provider know about any changes, no  matter how small.  If you are 74 or older, have a clinician do a breast exam (clinical breast exam or CBE) every year. Depending on your age, family history, and medical history, it may be recommended that you also have a yearly breast X-ray (mammogram).  If you have a family history of breast cancer, talk with your health care provider about genetic screening.  If you are at high risk for breast cancer, talk with your health care provider about having an MRI and a mammogram every year.  Breast cancer (BRCA) gene test is recommended for women who have family members with BRCA-related cancers. Results of the assessment will determine the need for genetic counseling and BRCA1 and for BRCA2 testing. BRCA-related cancers include these types: ? Breast. This occurs in males or females. ? Ovarian. ? Tubal. This may also be called fallopian tube cancer. ? Cancer of the abdominal or pelvic lining (peritoneal cancer). ? Prostate. ? Pancreatic.  Cervical, Uterine, and Ovarian Cancer Your health care provider may recommend that you be screened regularly for cancer of the pelvic organs. These include your ovaries, uterus, and vagina. This screening involves a pelvic exam, which includes checking for microscopic changes to the surface of your cervix (Pap test).  For women ages 21-65, health care providers may recommend a pelvic exam and a Pap test every three years. For women ages 75-65, they may recommend the Pap test and pelvic exam, combined with testing for human papilloma virus (HPV), every five years. Some types of HPV increase your risk of cervical cancer. Testing for HPV may also be done on women of any age who have unclear Pap test results.  Other health care providers may not recommend any screening for nonpregnant women who are considered low risk for pelvic cancer and have no symptoms. Ask your health care provider if a screening pelvic exam is right for you.  If you have had past treatment for  cervical cancer or a condition that could lead to cancer, you need Pap tests and screening for cancer for at least 20 years after your treatment. If Pap tests have been discontinued for you, your risk factors (such as having a new sexual partner) need to be reassessed to determine if you should start having screenings again. Some women have medical problems that increase the chance of getting cervical cancer. In these cases, your health care provider may recommend that you have screening and Pap tests more often.  If you have a family history of uterine cancer or ovarian cancer, talk with your health care provider about genetic screening.  If you have vaginal bleeding after reaching menopause, tell your health care provider.  There are currently no reliable tests available to screen for ovarian cancer.  Lung Cancer Lung cancer screening is recommended for adults 66-44 years old who are at high risk for lung cancer because of a history of smoking. A yearly low-dose CT scan of the lungs is recommended if you:  Currently smoke.  Have a history of at least 30 pack-years of smoking and you currently smoke or have quit within the past 15 years. A pack-year is smoking an average of one pack of cigarettes per day for one year.  Yearly  screening should:  Continue until it has been 15 years since you quit.  Stop if you develop a health problem that would prevent you from having lung cancer treatment.  Colorectal Cancer  This type of cancer can be detected and can often be prevented.  Routine colorectal cancer screening usually begins at age 4 and continues through age 33.  If you have risk factors for colon cancer, your health care provider may recommend that you be screened at an earlier age.  If you have a family history of colorectal cancer, talk with your health care provider about genetic screening.  Your health care provider may also recommend using home test kits to check for hidden  blood in your stool.  A small camera at the end of a tube can be used to examine your colon directly (sigmoidoscopy or colonoscopy). This is done to check for the earliest forms of colorectal cancer.  Direct examination of the colon should be repeated every 5-10 years until age 42. However, if early forms of precancerous polyps or small growths are found or if you have a family history or genetic risk for colorectal cancer, you may need to be screened more often.  Skin Cancer  Check your skin from head to toe regularly.  Monitor any moles. Be sure to tell your health care provider: ? About any new moles or changes in moles, especially if there is a change in a mole's shape or color. ? If you have a mole that is larger than the size of a pencil eraser.  If any of your family members has a history of skin cancer, especially at a young age, talk with your health care provider about genetic screening.  Always use sunscreen. Apply sunscreen liberally and repeatedly throughout the day.  Whenever you are outside, protect yourself by wearing long sleeves, pants, a wide-brimmed hat, and sunglasses.  What should I know about osteoporosis? Osteoporosis is a condition in which bone destruction happens more quickly than new bone creation. After menopause, you may be at an increased risk for osteoporosis. To help prevent osteoporosis or the bone fractures that can happen because of osteoporosis, the following is recommended:  If you are 71-49 years old, get at least 1,000 mg of calcium and at least 600 mg of vitamin D per day.  If you are older than age 39 but younger than age 56, get at least 1,200 mg of calcium and at least 600 mg of vitamin D per day.  If you are older than age 57, get at least 1,200 mg of calcium and at least 800 mg of vitamin D per day.  Smoking and excessive alcohol intake increase the risk of osteoporosis. Eat foods that are rich in calcium and vitamin D, and do weight-bearing  exercises several times each week as directed by your health care provider. What should I know about how menopause affects my mental health? Depression may occur at any age, but it is more common as you become older. Common symptoms of depression include:  Low or sad mood.  Changes in sleep patterns.  Changes in appetite or eating patterns.  Feeling an overall lack of motivation or enjoyment of activities that you previously enjoyed.  Frequent crying spells.  Talk with your health care provider if you think that you are experiencing depression. What should I know about immunizations? It is important that you get and maintain your immunizations. These include:  Tetanus, diphtheria, and pertussis (Tdap) booster vaccine.  Influenza every  year before the flu season begins.  Pneumonia vaccine.  Shingles vaccine.  Your health care provider may also recommend other immunizations. This information is not intended to replace advice given to you by your health care provider. Make sure you discuss any questions you have with your health care provider. Document Released: 06/23/2005 Document Revised: 11/19/2015 Document Reviewed: 02/02/2015 Elsevier Interactive Patient Education  2018 [employer].

## 2017-06-20 DIAGNOSIS — Z1212 Encounter for screening for malignant neoplasm of rectum: Secondary | ICD-10-CM | POA: Diagnosis not present

## 2017-06-20 DIAGNOSIS — Z1211 Encounter for screening for malignant neoplasm of colon: Secondary | ICD-10-CM | POA: Diagnosis not present

## 2017-06-20 LAB — COLOGUARD

## 2017-07-02 ENCOUNTER — Encounter: Payer: Self-pay | Admitting: Family Medicine

## 2017-07-16 ENCOUNTER — Other Ambulatory Visit: Payer: Self-pay | Admitting: Family Medicine

## 2017-07-25 ENCOUNTER — Encounter: Payer: Medicare Other | Admitting: Obstetrics & Gynecology

## 2017-07-31 ENCOUNTER — Encounter: Payer: Medicare Other | Admitting: Obstetrics & Gynecology

## 2017-09-21 ENCOUNTER — Ambulatory Visit: Payer: Self-pay

## 2017-09-21 NOTE — Telephone Encounter (Signed)
Patient called in with c/o "loss of vision." She says "on Wednesday I noticed some changes in my vision to my left eye. I was seeing floaters. Then I lost vision to the bottom of the eye. When I looked straight ahead, I couldn't see the bottom portion of the visual field, then everything went blurry. I never had a total loss of vision. Today, my eye is blurry and I can barely see out of it. My right eye is clear, but I can't read out of that eye due to some past surgery. I use the left eye to see up close. I can still drive, because I can use my right eye." I asked was there injury or pain to the eye, she says "no. This just happened and I can't say if it was gradual or sudden." According to protocol, go to ED. I advised the patient that [facility] is the best option because they have an ophthalmology physician, unless she has an opthamologist that she sees. She says "I don't have one because I just moved here not long ago. Do they have one at the ER at near the office?" I called the [facility] and was told there is not one there. I advised the patient and she says she will go to [facility]. Care advice given, patient verbalized understanding.  Reason for Disposition . [1] Blurred vision or visual changes AND [2] present now AND [3] sudden onset or new (e.g., minutes, hours, days)  (Exception: seeing floaters / black specks OR previously diagnosed migraine headaches with same symptoms)  Answer Assessment - Initial Assessment Questions 1. DESCRIPTION: "What is the vision loss like? Describe it for me." (e.g., complete vision loss, blurred vision, double vision, floaters, etc.)     Loss of vision at the bottom of the left eye, blurry 2. LOCATION: "One or both eyes?" If one, ask: "Which eye?"     Left eye 3. SEVERITY: "Can you see anything?" If so, ask: "What can you see?" (e.g., fine print)     Yes most things, but blurry and spots darker than others 4. ONSET: "When did this begin?" "Did it start suddenly or  has this been gradual?"     2 days ago; unsure if it was sudden or gradual 5. PATTERN: "Does this come and go, or has it been constant since it started?"     Contstant 6. PAIN: "Is there any pain in your eye(s)?"  (Scale 1-10; or mild, moderate, severe)     No 7. CONTACTS-GLASSES: "Do you wear contacts or glasses?"     Glasses for reading 8. CAUSE: "What do you think is causing this visual problem?"     I don't know 9. OTHER SYMPTOMS: "Do you have any other symptoms?" (e.g., confusion, headache, arm or leg weakness, speech problems)     No 10. PREGNANCY: "Is there any chance you are pregnant?" "When was your last menstrual period?"       No  Protocols used: VISION LOSS OR CHANGE-A-AH

## 2017-09-21 NOTE — Telephone Encounter (Signed)
Agreed with triage nurse to send patient to ED. pcp out of office today.

## 2017-09-24 DIAGNOSIS — Z961 Presence of intraocular lens: Secondary | ICD-10-CM | POA: Diagnosis not present

## 2017-09-24 DIAGNOSIS — H538 Other visual disturbances: Secondary | ICD-10-CM | POA: Diagnosis not present

## 2017-09-24 DIAGNOSIS — H348322 Tributary (branch) retinal vein occlusion, left eye, stable: Secondary | ICD-10-CM | POA: Diagnosis not present

## 2017-10-09 DIAGNOSIS — H35352 Cystoid macular degeneration, left eye: Secondary | ICD-10-CM | POA: Diagnosis not present

## 2017-10-09 DIAGNOSIS — H348322 Tributary (branch) retinal vein occlusion, left eye, stable: Secondary | ICD-10-CM | POA: Diagnosis not present

## 2017-10-11 DIAGNOSIS — H02833 Dermatochalasis of right eye, unspecified eyelid: Secondary | ICD-10-CM | POA: Diagnosis not present

## 2017-10-11 DIAGNOSIS — H02836 Dermatochalasis of left eye, unspecified eyelid: Secondary | ICD-10-CM | POA: Diagnosis not present

## 2017-10-11 DIAGNOSIS — H35352 Cystoid macular degeneration, left eye: Secondary | ICD-10-CM | POA: Diagnosis not present

## 2017-10-11 DIAGNOSIS — Z961 Presence of intraocular lens: Secondary | ICD-10-CM | POA: Diagnosis not present

## 2017-10-11 DIAGNOSIS — H348322 Tributary (branch) retinal vein occlusion, left eye, stable: Secondary | ICD-10-CM | POA: Diagnosis not present

## 2017-10-11 DIAGNOSIS — H43813 Vitreous degeneration, bilateral: Secondary | ICD-10-CM | POA: Diagnosis not present

## 2017-10-19 ENCOUNTER — Encounter: Payer: Self-pay | Admitting: Family Medicine

## 2017-10-19 ENCOUNTER — Other Ambulatory Visit: Payer: Self-pay | Admitting: Family Medicine

## 2017-10-19 MED ORDER — FLUCONAZOLE 150 MG PO TABS: 150.0000 mg | ORAL_TABLET | Freq: Once | ORAL | 0 refills | Status: AC

## 2017-10-19 NOTE — Progress Notes (Signed)
Replied to pt and will rx diflucan

## 2017-10-19 NOTE — Telephone Encounter (Signed)
Please send in if appropriate. If patient needs appointment I will call to schedule.

## 2017-11-08 DIAGNOSIS — Z961 Presence of intraocular lens: Secondary | ICD-10-CM | POA: Diagnosis not present

## 2017-11-08 DIAGNOSIS — H43813 Vitreous degeneration, bilateral: Secondary | ICD-10-CM | POA: Diagnosis not present

## 2017-11-08 DIAGNOSIS — H348322 Tributary (branch) retinal vein occlusion, left eye, stable: Secondary | ICD-10-CM | POA: Diagnosis not present

## 2017-11-08 DIAGNOSIS — H35352 Cystoid macular degeneration, left eye: Secondary | ICD-10-CM | POA: Diagnosis not present

## 2017-11-23 ENCOUNTER — Encounter: Payer: Self-pay | Admitting: Family Medicine

## 2017-11-23 DIAGNOSIS — E2839 Other primary ovarian failure: Secondary | ICD-10-CM

## 2017-11-23 DIAGNOSIS — Z13820 Encounter for screening for osteoporosis: Secondary | ICD-10-CM

## 2017-11-28 DIAGNOSIS — H43813 Vitreous degeneration, bilateral: Secondary | ICD-10-CM | POA: Diagnosis not present

## 2017-11-28 DIAGNOSIS — Z961 Presence of intraocular lens: Secondary | ICD-10-CM | POA: Diagnosis not present

## 2017-11-28 DIAGNOSIS — H348322 Tributary (branch) retinal vein occlusion, left eye, stable: Secondary | ICD-10-CM | POA: Diagnosis not present

## 2017-11-29 ENCOUNTER — Ambulatory Visit (HOSPITAL_BASED_OUTPATIENT_CLINIC_OR_DEPARTMENT_OTHER)
Admission: RE | Admit: 2017-11-29 | Discharge: 2017-11-29 | Disposition: A | Discharge status: Home / Self Care | Payer: Medicare Other | Source: Ambulatory Visit | Attending: Family Medicine | Admitting: Family Medicine

## 2017-11-29 DIAGNOSIS — E2839 Other primary ovarian failure: Secondary | ICD-10-CM | POA: Diagnosis not present

## 2017-11-29 DIAGNOSIS — Z780 Asymptomatic menopausal state: Secondary | ICD-10-CM | POA: Diagnosis not present

## 2017-11-29 DIAGNOSIS — Z13820 Encounter for screening for osteoporosis: Secondary | ICD-10-CM | POA: Diagnosis not present

## 2017-11-29 DIAGNOSIS — M8589 Other specified disorders of bone density and structure, multiple sites: Secondary | ICD-10-CM | POA: Insufficient documentation

## 2017-11-30 ENCOUNTER — Encounter: Payer: Self-pay | Admitting: Family Medicine

## 2017-11-30 DIAGNOSIS — M8580 Other specified disorders of bone density and structure, unspecified site: Secondary | ICD-10-CM | POA: Insufficient documentation

## 2017-11-30 HISTORY — DX: Other specified disorders of bone density and structure, unspecified site: M85.80

## 2018-01-09 DIAGNOSIS — H43813 Vitreous degeneration, bilateral: Secondary | ICD-10-CM | POA: Diagnosis not present

## 2018-01-09 DIAGNOSIS — Z961 Presence of intraocular lens: Secondary | ICD-10-CM | POA: Diagnosis not present

## 2018-01-09 DIAGNOSIS — H34832 Tributary (branch) retinal vein occlusion, left eye, with macular edema: Secondary | ICD-10-CM

## 2018-01-09 HISTORY — DX: Tributary (branch) retinal vein occlusion, left eye, with macular edema: H34.8320

## 2018-01-24 DIAGNOSIS — Z23 Encounter for immunization: Secondary | ICD-10-CM | POA: Diagnosis not present

## 2018-02-18 DIAGNOSIS — H43813 Vitreous degeneration, bilateral: Secondary | ICD-10-CM

## 2018-02-18 DIAGNOSIS — Z961 Presence of intraocular lens: Secondary | ICD-10-CM

## 2018-02-18 HISTORY — DX: Presence of intraocular lens: Z96.1

## 2018-02-18 HISTORY — DX: Vitreous degeneration, bilateral: H43.813

## 2018-02-20 DIAGNOSIS — Z961 Presence of intraocular lens: Secondary | ICD-10-CM | POA: Diagnosis not present

## 2018-02-20 DIAGNOSIS — H43813 Vitreous degeneration, bilateral: Secondary | ICD-10-CM | POA: Diagnosis not present

## 2018-02-20 DIAGNOSIS — H34832 Tributary (branch) retinal vein occlusion, left eye, with macular edema: Secondary | ICD-10-CM | POA: Diagnosis not present

## 2018-02-26 ENCOUNTER — Other Ambulatory Visit: Payer: Self-pay | Admitting: Family Medicine

## 2018-02-26 DIAGNOSIS — R300 Dysuria: Secondary | ICD-10-CM

## 2018-02-27 ENCOUNTER — Encounter: Payer: Self-pay | Admitting: Family Medicine

## 2018-02-27 DIAGNOSIS — R300 Dysuria: Secondary | ICD-10-CM

## 2018-02-28 MED ORDER — CIPROFLOXACIN HCL 500 MG PO TABS: 500.0000 mg | ORAL_TABLET | Freq: Two times a day (BID) | ORAL | 0 refills | Status: DC

## 2018-03-08 ENCOUNTER — Encounter: Payer: Self-pay | Admitting: Family Medicine

## 2018-03-12 NOTE — Telephone Encounter (Signed)
No answer

## 2018-03-27 DIAGNOSIS — H43813 Vitreous degeneration, bilateral: Secondary | ICD-10-CM | POA: Diagnosis not present

## 2018-03-27 DIAGNOSIS — H34832 Tributary (branch) retinal vein occlusion, left eye, with macular edema: Secondary | ICD-10-CM | POA: Diagnosis not present

## 2018-03-27 DIAGNOSIS — Z961 Presence of intraocular lens: Secondary | ICD-10-CM | POA: Diagnosis not present

## 2018-04-11 ENCOUNTER — Encounter: Payer: Self-pay | Admitting: Family Medicine

## 2018-04-11 DIAGNOSIS — E034 Atrophy of thyroid (acquired): Secondary | ICD-10-CM

## 2018-04-12 NOTE — Addendum Note (Signed)
Addended by: Abbe AmsterdamOPLAND,  C on: 04/12/2018 04:43 PM   Modules accepted: Orders

## 2018-04-15 ENCOUNTER — Other Ambulatory Visit: Payer: Self-pay

## 2018-04-15 ENCOUNTER — Ambulatory Visit (INDEPENDENT_AMBULATORY_CARE_PROVIDER_SITE_OTHER): Payer: Medicare Other | Admitting: Family Medicine

## 2018-04-15 ENCOUNTER — Telehealth: Payer: Self-pay | Admitting: Emergency Medicine

## 2018-04-15 ENCOUNTER — Ambulatory Visit: Payer: Self-pay

## 2018-04-15 ENCOUNTER — Encounter: Payer: Self-pay | Admitting: Family Medicine

## 2018-04-15 ENCOUNTER — Encounter (HOSPITAL_COMMUNITY): Payer: Self-pay | Admitting: Emergency Medicine

## 2018-04-15 ENCOUNTER — Inpatient Hospital Stay (HOSPITAL_COMMUNITY)
Admission: EM | Admit: 2018-04-15 | Discharge: 2018-04-23 | DRG: 234 | Disposition: A | Discharge status: Home / Self Care | Payer: Medicare Other | Attending: Cardiothoracic Surgery | Admitting: Cardiothoracic Surgery

## 2018-04-15 ENCOUNTER — Ambulatory Visit (HOSPITAL_BASED_OUTPATIENT_CLINIC_OR_DEPARTMENT_OTHER)
Admission: RE | Admit: 2018-04-15 | Discharge: 2018-04-15 | Disposition: A | Discharge status: Home / Self Care | Payer: Medicare Other | Source: Ambulatory Visit | Attending: Family Medicine | Admitting: Family Medicine

## 2018-04-15 VITALS — BP 120/82 | HR 86 | Temp 97.6°F | Resp 16 | Ht 60.0 in | Wt 162.0 lb

## 2018-04-15 DIAGNOSIS — I2510 Atherosclerotic heart disease of native coronary artery without angina pectoris: Secondary | ICD-10-CM

## 2018-04-15 DIAGNOSIS — Z87891 Personal history of nicotine dependence: Secondary | ICD-10-CM

## 2018-04-15 DIAGNOSIS — R0789 Other chest pain: Secondary | ICD-10-CM

## 2018-04-15 DIAGNOSIS — R079 Chest pain, unspecified: Secondary | ICD-10-CM | POA: Diagnosis not present

## 2018-04-15 DIAGNOSIS — R1011 Right upper quadrant pain: Secondary | ICD-10-CM | POA: Diagnosis not present

## 2018-04-15 DIAGNOSIS — E7800 Pure hypercholesterolemia, unspecified: Secondary | ICD-10-CM | POA: Diagnosis present

## 2018-04-15 DIAGNOSIS — J9811 Atelectasis: Secondary | ICD-10-CM | POA: Diagnosis not present

## 2018-04-15 DIAGNOSIS — Z4682 Encounter for fitting and adjustment of non-vascular catheter: Secondary | ICD-10-CM | POA: Diagnosis not present

## 2018-04-15 DIAGNOSIS — E034 Atrophy of thyroid (acquired): Secondary | ICD-10-CM

## 2018-04-15 DIAGNOSIS — Z09 Encounter for follow-up examination after completed treatment for conditions other than malignant neoplasm: Secondary | ICD-10-CM

## 2018-04-15 DIAGNOSIS — I214 Non-ST elevation (NSTEMI) myocardial infarction: Secondary | ICD-10-CM | POA: Diagnosis not present

## 2018-04-15 DIAGNOSIS — I350 Nonrheumatic aortic (valve) stenosis: Secondary | ICD-10-CM | POA: Diagnosis present

## 2018-04-15 DIAGNOSIS — Z951 Presence of aortocoronary bypass graft: Secondary | ICD-10-CM

## 2018-04-15 DIAGNOSIS — Z91041 Radiographic dye allergy status: Secondary | ICD-10-CM | POA: Diagnosis not present

## 2018-04-15 DIAGNOSIS — I25110 Atherosclerotic heart disease of native coronary artery with unstable angina pectoris: Secondary | ICD-10-CM | POA: Diagnosis not present

## 2018-04-15 DIAGNOSIS — K219 Gastro-esophageal reflux disease without esophagitis: Secondary | ICD-10-CM | POA: Diagnosis present

## 2018-04-15 DIAGNOSIS — Z01810 Encounter for preprocedural cardiovascular examination: Secondary | ICD-10-CM | POA: Diagnosis not present

## 2018-04-15 DIAGNOSIS — D62 Acute posthemorrhagic anemia: Secondary | ICD-10-CM | POA: Diagnosis not present

## 2018-04-15 DIAGNOSIS — Z6831 Body mass index (BMI) 31.0-31.9, adult: Secondary | ICD-10-CM

## 2018-04-15 DIAGNOSIS — Z79899 Other long term (current) drug therapy: Secondary | ICD-10-CM

## 2018-04-15 DIAGNOSIS — E669 Obesity, unspecified: Secondary | ICD-10-CM | POA: Diagnosis present

## 2018-04-15 DIAGNOSIS — Z9689 Presence of other specified functional implants: Secondary | ICD-10-CM

## 2018-04-15 DIAGNOSIS — R0989 Other specified symptoms and signs involving the circulatory and respiratory systems: Secondary | ICD-10-CM | POA: Diagnosis not present

## 2018-04-15 DIAGNOSIS — B1910 Unspecified viral hepatitis B without hepatic coma: Secondary | ICD-10-CM | POA: Diagnosis present

## 2018-04-15 DIAGNOSIS — F1021 Alcohol dependence, in remission: Secondary | ICD-10-CM | POA: Diagnosis present

## 2018-04-15 DIAGNOSIS — J90 Pleural effusion, not elsewhere classified: Secondary | ICD-10-CM | POA: Diagnosis not present

## 2018-04-15 DIAGNOSIS — E876 Hypokalemia: Secondary | ICD-10-CM | POA: Diagnosis not present

## 2018-04-15 DIAGNOSIS — I25810 Atherosclerosis of coronary artery bypass graft(s) without angina pectoris: Secondary | ICD-10-CM | POA: Diagnosis not present

## 2018-04-15 DIAGNOSIS — M8580 Other specified disorders of bone density and structure, unspecified site: Secondary | ICD-10-CM | POA: Diagnosis not present

## 2018-04-15 DIAGNOSIS — E039 Hypothyroidism, unspecified: Secondary | ICD-10-CM | POA: Diagnosis present

## 2018-04-15 DIAGNOSIS — E782 Mixed hyperlipidemia: Secondary | ICD-10-CM | POA: Diagnosis not present

## 2018-04-15 DIAGNOSIS — R011 Cardiac murmur, unspecified: Secondary | ICD-10-CM | POA: Diagnosis not present

## 2018-04-15 DIAGNOSIS — I340 Nonrheumatic mitral (valve) insufficiency: Secondary | ICD-10-CM | POA: Diagnosis not present

## 2018-04-15 HISTORY — DX: Non-ST elevation (NSTEMI) myocardial infarction: I21.4

## 2018-04-15 LAB — COMPREHENSIVE METABOLIC PANEL
ALT: 15 U/L (ref 0–35)
AST: 21 U/L (ref 0–37)
Albumin: 4.5 g/dL (ref 3.5–5.2)
Alkaline Phosphatase: 61 U/L (ref 39–117)
BUN: 8 mg/dL (ref 6–23)
CHLORIDE: 101 meq/L (ref 96–112)
CO2: 28 meq/L (ref 19–32)
CREATININE: 0.73 mg/dL (ref 0.40–1.20)
Calcium: 9.8 mg/dL (ref 8.4–10.5)
GFR: 83.86 mL/min (ref >60.00)
Glucose, Bld: 85 mg/dL (ref 70–99)
POTASSIUM: 4.3 meq/L (ref 3.5–5.1)
Sodium: 138 mEq/L (ref 135–145)
TOTAL PROTEIN: 7.4 g/dL (ref 6.0–8.3)
Total Bilirubin: 0.5 mg/dL (ref 0.2–1.2)

## 2018-04-15 LAB — CBC
HCT: 41 % (ref 36.0–46.0)
HEMATOCRIT: 41.7 % (ref 36.0–46.0)
Hemoglobin: 14.1 g/dL (ref 12.0–15.0)
Hemoglobin: 13.8 g/dL (ref 12.0–15.0)
MCH: 32.4 pg (ref 26.0–34.0)
MCHC: 33.8 g/dL (ref 30.0–36.0)
MCHC: 33.7 g/dL (ref 30.0–36.0)
MCV: 98 fl (ref 78.0–100.0)
MCV: 96.2 fL (ref 80.0–100.0)
Platelets: 319 10*3/uL (ref 150.0–400.0)
Platelets: 280 10*3/uL (ref 150–400)
RBC: 4.25 Mil/uL (ref 3.87–5.11)
RBC: 4.26 MIL/uL (ref 3.87–5.11)
RDW: 12 % (ref 11.5–15.5)
RDW: 11.7 % (ref 11.5–15.5)
WBC: 6 10*3/uL (ref 4.0–10.5)
WBC: 6.5 10*3/uL (ref 4.0–10.5)
nRBC: 0 % (ref 0.0–0.2)

## 2018-04-15 LAB — I-STAT TROPONIN, ED
TROPONIN I, POC: 0.48 ng/mL — AB (ref 0.00–0.08)
Troponin i, poc: 0.46 ng/mL (ref 0.00–0.08)

## 2018-04-15 LAB — BASIC METABOLIC PANEL
Anion gap: 13 (ref 5–15)
BUN: <5 mg/dL — ABNORMAL LOW (ref 8–23)
CALCIUM: 9.5 mg/dL (ref 8.9–10.3)
CO2: 25 mmol/L (ref 22–32)
Chloride: 98 mmol/L (ref 98–111)
Creatinine, Ser: 0.77 mg/dL (ref 0.44–1.00)
GFR calc Af Amer: >60 mL/min (ref >60)
GFR calc non Af Amer: >60 mL/min (ref >60)
Glucose, Bld: 88 mg/dL (ref 70–99)
Potassium: 3.6 mmol/L (ref 3.5–5.1)
Sodium: 136 mmol/L (ref 135–145)

## 2018-04-15 LAB — TSH: TSH: 1.43 u[IU]/mL (ref 0.35–4.50)

## 2018-04-15 LAB — TROPONIN I
TNIDX: 0.54 ug/l — ABNORMAL HIGH (ref 0.00–0.06)
Troponin I: 0.58 ng/mL (ref <0.03)

## 2018-04-15 MED ORDER — ASPIRIN 81 MG PO CHEW
324.0000 mg | CHEWABLE_TABLET | Freq: Once | ORAL | Status: AC
  Administered 2018-04-15: 324 mg via ORAL
  Filled 2018-04-15: qty 4

## 2018-04-15 MED ORDER — SODIUM CHLORIDE 0.9 % IV SOLN: 250.0000 mL | INTRAVENOUS | Status: DC | PRN

## 2018-04-15 MED ORDER — DIPHENHYDRAMINE HCL 25 MG PO CAPS: 50.0000 mg | ORAL_CAPSULE | Freq: Once | ORAL | Status: AC

## 2018-04-15 MED ORDER — ACYCLOVIR 400 MG PO TABS
400.0000 mg | ORAL_TABLET | Freq: Two times a day (BID) | ORAL | Status: DC
  Administered 2018-04-16 – 2018-04-23 (×14): 400 mg via ORAL
  Filled 2018-04-15 (×16): qty 1

## 2018-04-15 MED ORDER — SODIUM CHLORIDE 0.9 % WEIGHT BASED INFUSION
3.0000 mL/kg/h | INTRAVENOUS | Status: DC
  Administered 2018-04-16: 3 mL/kg/h via INTRAVENOUS

## 2018-04-15 MED ORDER — NITROGLYCERIN 0.4 MG SL SUBL: 0.4000 mg | SUBLINGUAL_TABLET | SUBLINGUAL | Status: DC | PRN

## 2018-04-15 MED ORDER — SODIUM CHLORIDE 0.9 % WEIGHT BASED INFUSION: 1.0000 mL/kg/h | INTRAVENOUS | Status: DC

## 2018-04-15 MED ORDER — ASPIRIN 81 MG PO CHEW
81.0000 mg | CHEWABLE_TABLET | ORAL | Status: AC
  Administered 2018-04-16: 81 mg via ORAL
  Filled 2018-04-15: qty 1

## 2018-04-15 MED ORDER — ONDANSETRON HCL 4 MG/2ML IJ SOLN: 4.0000 mg | Freq: Four times a day (QID) | INTRAMUSCULAR | Status: DC | PRN

## 2018-04-15 MED ORDER — HEPARIN (PORCINE) 25000 UT/250ML-% IV SOLN
750.0000 [IU]/h | INTRAVENOUS | Status: DC
  Administered 2018-04-15: 750 [IU]/h via INTRAVENOUS
  Filled 2018-04-15: qty 250

## 2018-04-15 MED ORDER — NITROGLYCERIN 0.4 MG SL SUBL
0.4000 mg | SUBLINGUAL_TABLET | SUBLINGUAL | Status: DC | PRN
  Administered 2018-04-16 – 2018-04-17 (×2): 0.4 mg via SUBLINGUAL
  Filled 2018-04-15 (×2): qty 1

## 2018-04-15 MED ORDER — ACETAMINOPHEN 325 MG PO TABS
650.0000 mg | ORAL_TABLET | ORAL | Status: DC | PRN
  Administered 2018-04-16 (×3): 650 mg via ORAL
  Filled 2018-04-15 (×3): qty 2

## 2018-04-15 MED ORDER — PREDNISONE 50 MG PO TABS
50.0000 mg | ORAL_TABLET | Freq: Four times a day (QID) | ORAL | Status: AC
  Administered 2018-04-15 – 2018-04-16 (×3): 50 mg via ORAL
  Filled 2018-04-15 (×3): qty 1

## 2018-04-15 MED ORDER — SODIUM CHLORIDE 0.9% FLUSH: 3.0000 mL | INTRAVENOUS | Status: DC | PRN

## 2018-04-15 MED ORDER — HEPARIN BOLUS VIA INFUSION
3500.0000 [IU] | Freq: Once | INTRAVENOUS | Status: AC
  Administered 2018-04-15: 3500 [IU] via INTRAVENOUS
  Filled 2018-04-15: qty 3500

## 2018-04-15 MED ORDER — ASPIRIN EC 81 MG PO TBEC
81.0000 mg | DELAYED_RELEASE_TABLET | Freq: Every day | ORAL | Status: DC
  Administered 2018-04-16: 81 mg via ORAL
  Filled 2018-04-15: qty 1

## 2018-04-15 MED ORDER — PANTOPRAZOLE SODIUM 40 MG PO TBEC
40.0000 mg | DELAYED_RELEASE_TABLET | Freq: Every day | ORAL | Status: DC
  Administered 2018-04-16: 40 mg via ORAL
  Filled 2018-04-15: qty 1

## 2018-04-15 MED ORDER — LEVOTHYROXINE SODIUM 88 MCG PO TABS
88.0000 ug | ORAL_TABLET | Freq: Every day | ORAL | Status: DC
  Administered 2018-04-16 – 2018-04-23 (×8): 88 ug via ORAL
  Filled 2018-04-15 (×8): qty 1

## 2018-04-15 MED ORDER — SODIUM CHLORIDE 0.9% FLUSH
3.0000 mL | Freq: Two times a day (BID) | INTRAVENOUS | Status: DC
  Administered 2018-04-15: 3 mL via INTRAVENOUS

## 2018-04-15 MED ORDER — DIPHENHYDRAMINE HCL 50 MG/ML IJ SOLN
50.0000 mg | Freq: Once | INTRAMUSCULAR | Status: AC
  Administered 2018-04-16: 50 mg via INTRAVENOUS
  Filled 2018-04-15: qty 1

## 2018-04-15 MED ORDER — METOPROLOL TARTRATE 12.5 MG HALF TABLET
12.5000 mg | ORAL_TABLET | Freq: Two times a day (BID) | ORAL | Status: DC
  Administered 2018-04-15 – 2018-04-16 (×3): 12.5 mg via ORAL
  Filled 2018-04-15 (×3): qty 1

## 2018-04-15 MED ORDER — ATORVASTATIN CALCIUM 80 MG PO TABS
80.0000 mg | ORAL_TABLET | Freq: Every day | ORAL | Status: DC
  Administered 2018-04-16 – 2018-04-22 (×6): 80 mg via ORAL
  Filled 2018-04-15 (×6): qty 1

## 2018-04-15 NOTE — Patient Instructions (Signed)
We are going to check on your heart with a troponin,and also get an ultrasound of your gallbladder asap Please go to lab and then go to the imaging dept on the ground floor to have your chest film and schedule your ultrasound.

## 2018-04-15 NOTE — Progress Notes (Signed)
Patient admitted to the unit on admission gives verbal consent to discuss medical information with her friend Cindi CarbonClaire McDougal and gives permission for Alan RipperClaire to make medical decision if the patient is unable to do so for herself.

## 2018-04-15 NOTE — Progress Notes (Signed)
ANTICOAGULATION CONSULT NOTE - Initial Consult  Pharmacy Consult for heparin Indication: chest pain/ACS  Allergies  Allergen Reactions  . Dye Fdc Blue  [Brilliant Blue Fcf] Anaphylaxis    Dye used for CAT scans  . Contrast Media [Iodinated Diagnostic Agents]     Patient Measurements: Height: 5' (152.4 cm) Weight: 162 lb (73.5 kg) IBW/kg (Calculated) : 45.5 Heparin Dosing Weight: 62 kg  Vital Signs: Temp: 98.4 F (36.9 C) (12/02 1626) Temp Source: Oral (12/02 1626) BP: 139/95 (12/02 1626) Pulse Rate: 101 (12/02 1626)  Labs: Recent Labs    04/15/18 1401 04/15/18 1625  HGB 14.1 13.8  HCT 41.7 41.0  PLT 319.0 280  CREATININE 0.73 0.77    Estimated Creatinine Clearance: 59.4 mL/min (by C-G formula based on SCr of 0.77 mg/dL).   Medical History: Past Medical History:  Diagnosis Date  . Arthritis   . Chicken pox   . Dyslipidemia   . Genital herpes   . GERD (gastroesophageal reflux disease)   . Hepatitis B   . Hypothyroidism   . Kidney stone   . Pancreatitis     Medications:   (Not in a hospital admission)  Assessment: [age] YOF who presented with chest pain. Initial troponin is elevated. Pharmacy consulted to start IV heparin for ACS. H/H and Plt wnl. Patient states she is not on any anticoagulants at home.   Goal of Therapy:  Heparin level 0.3-0.7 units/ml Monitor platelets by anticoagulation protocol: Yes   Plan:  -Heparin 3500 units IV bolus, then start IV heparin infusion at 750 units/hr -F/u 6 hr HL -Monitor daily HL, CBC and s/s of bleeding  Vinnie LevelBenjamin Sloka Volante, PharmD., BCPS Clinical Pharmacist Clinical phone for 04/15/18 until 11pm: [phone]: [phone] If after 3:30pm, please refer to [facility] for unit-specific pharmacist

## 2018-04-15 NOTE — Telephone Encounter (Signed)
FYI

## 2018-04-15 NOTE — Progress Notes (Addendum)
[facility] [address] [place], [place] [address] [phone]  Date:  04/15/2018   Name:  [name]   DOB:  1950/11/25   MRN:  [id number]  PCP:  Pearline Cables, MD    Chief Complaint: Chest Pain (off an dont 6 weeks, thinks its gallbladder, pain in back radiating right arm, feels like there is a lot of pressure on chest, no other symptoms)   History of Present Illness:  [name] is a 67 y.o. very pleasant female patient who presents with the following:  History of hypothyroidism and dyslipidemia Last seen here in January, TSH normal at that time  Lab Results  Component Value Date   TSH 1.37 06/07/2017    Here today with concern of shortness of breath and needing her TSH checked She had sent me a mychart message recently: I have cold hands, dry hair and am more short of breath than usual. Could I need to check the TSH again?? Or do I need to see you? I am still going to the gym 5 days/week and doing well otherwise than the annoying plantar fasciitis that still plagues me when I walk or stand too much. Hopefully I will get new sneakers for Christmas and that should help!   Today she has complaint of chest pain/ pressure for about a month She is getting the CP every 2-3 days Most often occurs at night when supine Did occur once when she was doing her aerobics Last night it lasted for about 2 hours; however she had just eaten a fatty meal, which made her think about her gallbladder She has noted more burping and indigestion which is worse at night  Taking an additional prilosec does seem to maybe help  She is on 40 mg of prilosec  No SOB noted No sweating or nausea associated.    She has hyperlipidemia- this is treated with lipitor Her dad did die with CHF but he was in his 90s  She did have pancreatitis and hepatitis in her younger years due to alcohol, but she has been abstinent since 1991  No  bowel changes noted   No chest pain or pressure now   BP Readings from Last 3 Encounters:  04/15/18 120/82  06/07/17 (!) 144/84  09/18/16 125/75   Pulse Readings from Last 3 Encounters:  04/15/18 86  06/07/17 72  09/18/16 78    Patient Active Problem List   Diagnosis Date Noted  . Osteopenia 11/30/2017  . GERD (gastroesophageal reflux disease) 06/11/2016  . Osteoarthritis 06/11/2016  . Hypothyroidism (acquired) 06/11/2016  . Dyslipidemia 06/11/2016  . HSV-2 (herpes simplex virus 2) infection 06/11/2016    Past Medical History:  Diagnosis Date  . Arthritis   . Chicken pox   . Dyslipidemia   . Genital herpes   . GERD (gastroesophageal reflux disease)   . Hepatitis B   . Hypothyroidism   . Kidney stone   . Pancreatitis     Past Surgical History:  Procedure Laterality Date  . APPENDECTOMY  1972  . LAPAROSCOPY    . OVARIAN CYST SURGERY    . TONSILLECTOMY      Social History   Tobacco Use  . Smoking status: Former Games developer  . Smokeless tobacco: Never Used  Substance Use Topics  . Alcohol use: No  . Drug use: No    Family History  Problem Relation Age of Onset  . Alcoholism Mother   .  Kidney disease Mother   . Kidney Stones Mother   . Liver cancer Mother   . Alcoholism Father   . Hyperlipidemia Father   . Heart disease Father   . Heart failure Father   . Alcoholism Maternal Grandfather     Allergies  Allergen Reactions  . Dye Fdc Blue  [Brilliant Blue Fcf] Anaphylaxis    Dye used for CAT scans  . Contrast Media [Iodinated Diagnostic Agents]     Medication list has been reviewed and updated.  Current Outpatient Medications on File Prior to Visit  Medication Sig Dispense Refill  . acyclovir (ZOVIRAX) 400 MG tablet Take 1 tablet (400 mg total) by mouth 2 (two) times daily. 180 tablet 3  . atorvastatin (LIPITOR) 40 MG tablet Take 1 tablet (40 mg total) by mouth daily. 90 tablet 3  . docusate sodium (COLACE) 100 MG capsule Take 100 mg by mouth daily  as needed for mild constipation.    Marland Kitchen. ibuprofen (ADVIL,MOTRIN) 200 MG tablet Take 200 mg by mouth every 6 (six) hours as needed.    Marland Kitchen. levothyroxine (SYNTHROID) 88 MCG tablet Take 1 tablet (88 mcg total) by mouth daily before breakfast. 90 tablet 3  . omeprazole (PRILOSEC) 40 MG capsule Take 1 capsule (40 mg total) by mouth daily. 90 capsule 3   No current facility-administered medications on file prior to visit.     Review of Systems:  As per HPI- otherwise negative.   Physical Examination: Vitals:   04/15/18 1321  BP: 120/82  Pulse: 86  Resp: 16  Temp: 97.6 F (36.4 C)  SpO2: 98%   Vitals:   04/15/18 1321  Weight: 162 lb (73.5 kg)  Height: 5' (1.524 m)   Body mass index is 31.64 kg/m. Ideal Body Weight: Weight in (lb) to have BMI = 25: 127.7  GEN: WDWN, NAD, Non-toxic, A & O x 3, overweight, looks well  HEENT: Atraumatic, Normocephalic. Neck supple. No masses, No LAD. Ears and Nose: No external deformity. CV: RRR, No M/G/R. No JVD. No thrill. No extra heart sounds. PULM: CTA B, no wheezes, crackles, rhonchi. No retractions. No resp. distress. No accessory muscle use. ABD: S, ND, +BS. No rebound. No HSM. She has mild positive murphys' sign  EXTR: No c/c/e NEURO Normal gait.  PSYCH: Normally interactive. Conversant. Not depressed or anxious appearing.  Calm demeanor.   EKG:  NSR Compared with tracing from 2017- no change noted    Assessment and Plan: Chest discomfort - Plan: EKG 12-Lead, Troponin I -, DG Chest 2 View  RUQ pain - Plan: US Abdomen Limited RUQ, CBC, Comprehensive metabolic panel  Hypothyroidism due to acquired atrophy of thyroid - Plan: TSH  Here today with intermittent chest discomfort -atypical,suspect that this may be gallbladder Had an episode of pain last night. Now better   Will do a CXR and also run a troponin,labs. Will get a RUQ US for her asap   Signed Abbe AmsterdamJessica , MD  Received her troponin at 3:13 -it is high [facility]  [facility] please [facility] to [facility] ER Do not drive- have a friend take her or call 911 Called back and reached her- she is with her friend Alan RipperClaire who is driving They are going to [place] [facility] is [facility] to call and alert them    Results for orders placed or performed in visit on 04/15/18  Troponin I -  Result Value Ref Range   TNIDX 0.54 (H) 0.00 - 0.06  ug/l   Received her other labs - she is at the ER.  These labs look ok, message sent to pt  Results for orders placed or performed in visit on 04/15/18  TSH  Result Value Ref Range   TSH 1.43 0.35 - 4.50 uIU/mL  CBC  Result Value Ref Range   WBC 6.0 4.0 - 10.5 K/uL   RBC 4.25 3.87 - 5.11 Mil/uL   Platelets 319.0 150.0 - 400.0 K/uL   Hemoglobin 14.1 12.0 - 15.0 g/dL   HCT 16.1 09.6 - 04.5 %   MCV 98.0 78.0 - 100.0 fl   MCHC 33.8 30.0 - 36.0 g/dL   RDW 40.9 81.1 - 91.4 %  Comprehensive metabolic panel  Result Value Ref Range   Sodium 138 135 - 145 mEq/L   Potassium 4.3 3.5 - 5.1 mEq/L   Chloride 101 96 - 112 mEq/L   CO2 28 19 - 32 mEq/L   Glucose, Bld 85 70 - 99 mg/dL   BUN 8 6 - 23 mg/dL   Creatinine, Ser 7.82 0.40 - 1.20 mg/dL   Total Bilirubin 0.5 0.2 - 1.2 mg/dL   Alkaline Phosphatase 61 39 - 117 U/L   AST 21 0 - 37 U/L   ALT 15 0 - 35 U/L   Total Protein 7.4 6.0 - 8.3 g/dL   Albumin 4.5 3.5 - 5.2 g/dL   Calcium 9.8 8.4 - 95.6 mg/dL   GFR 21.30 >86.57 mL/min  Troponin I -  Result Value Ref Range   TNIDX 0.54 (H) 0.00 - 0.06 ug/l

## 2018-04-15 NOTE — Telephone Encounter (Signed)
"  CRITICAL VALUE STICKER  CRITICAL VALUE:Trop 0.54  RECEIVER (on-site recipient of call)Kristy P. DATE & TIME NOTIFIED: 04/15/18  MESSENGER (representative from lab):[email or web address]  MD NOTIFIED: Copland  TIME OF NOTIFICATION:1512  RESPONSE: Gave to Fredric MareBailey she will notify Dr. Patsy Lageropland

## 2018-04-15 NOTE — ED Notes (Signed)
Triage RN and Charge RN notified of I-stat Troponin result reading .46

## 2018-04-15 NOTE — Telephone Encounter (Signed)
Pt. Reports she started having chest pain 6 weeks ago. Hurts in the middle of chest and pain goes down right arm. Comes and goes and can last up to 45 minutes. No nausea,shortness of breath, sweating with theses episodes. States if she takes a Pepcid, the pain stops in 30 minutes.No heart history or family history.Has noticed increased shortness of breath with exertion and at exercise class. Checked BP last week - 132/68. Appointment made for today. Instructed if chest pain returns with above symptoms, to go to ED. Verbalizes understanding.   Reason for Disposition . [1] Chest pain lasts > 5 minutes AND [2] occurred > 3 days ago (72 hours) AND [3] NO chest pain or cardiac symptoms now  Answer Assessment - Initial Assessment Questions 1. LOCATION: "Where does it hurt?"        Center and goes down right arm 2. RADIATION: "Does the pain go anywhere else?" (e.g., into neck, jaw, arms, back)     Right arm 3. ONSET: "When did the chest pain begin?" (Minutes, hours or days)      6 weeks ago 4. PATTERN "Does the pain come and go, or has it been constant since it started?"  "Does it get worse with exertion?"      Comes and goes 5. DURATION: "How long does it last" (e.g., seconds, minutes, hours)     Sometimes 45 minutes 6. SEVERITY: "How bad is the pain?"  (e.g., Scale 1-10; mild, moderate, or severe)    - MILD (1-3): doesn't interfere with normal activities     - MODERATE (4-7): interferes with normal activities or awakens from sleep    - SEVERE (8-10): excruciating pain, unable to do any normal activities       9 last night 7. CARDIAC RISK FACTORS: "Do you have any history of heart problems or risk factors for heart disease?" (e.g., prior heart attack, angina; high blood pressure, diabetes, being overweight, high cholesterol, smoking, or strong family history of heart disease)     Father had CHF. 8. PULMONARY RISK FACTORS: "Do you have any history of lung disease?"  (e.g., blood clots in lung, asthma,  emphysema, birth control pills)     No 9. CAUSE: "What do you think is causing the chest pain?"     Maybe reflux 10. OTHER SYMPTOMS: "Do you have any other symptoms?" (e.g., dizziness, nausea, vomiting, sweating, fever, difficulty breathing, cough)       Shortness of breath with exertion 11. PREGNANCY: "Is there any chance you are pregnant?" "When was your last menstrual period?"       n/a  Protocols used: CHEST PAIN-A-AH

## 2018-04-15 NOTE — ED Provider Notes (Signed)
I saw and evaluated the patient, reviewed the resident's note and I agree with the findings and plan.  EKG: EKG Interpretation  Date/Time:  Monday April 15 2018 18:32:07 EST Ventricular Rate:  77 PR Interval:    QRS Duration: 88 QT Interval:  416 QTC Calculation: 471 R Axis:   7 Text Interpretation:  Sinus rhythm Left ventricular hypertrophy Confirmed by Lorre NickAllen, [place]endale Youngblood ([id number]) on 04/15/2018 7:[id number] PM   67 year old female presents with intermittent chest pressure times several days.  Went saw her doctor and outpatient blood work shows elevated troponin.  Confirmed here.  Patient has an NSTEMI and will be admitted to the cardiology service   Lorre NickAllen, Verniece Encarnacion, MD 04/15/18 1949

## 2018-04-15 NOTE — H&P (Signed)
Cardiology Admission History and Physical:   Patient ID: [name] MRN: [id number]; DOB: [birth year]   Admission date: 04/15/2018  Primary Care Provider: Pearline Cables, MD Primary Cardiologist: Chilton Si, MD (new)   Chief Complaint:  Chest pain  Patient Profile:   [name] is a 67 y.o. female with hypothyroidism, hyperlipidemia and alcoholism here with NSTEMI.  History of Present Illness:   [name] reports intermittent chest pain.  She typically works out at [employer]  She has had 2 episodes of exertional chest pressure that caused her to stop exercising.  It lasted for approximately 30 minutes.  There is no associated shortness of breath, nausea, or diaphoresis.  The discomfort radiated into her right arm.  She also felt pressure in between her shoulder blades.  Since that time she is also noted that she gets more short of breath.  Her most severe episodes have occurred when lying in bed last night she had an episode that lasted 2 hours and was 9 out of 10 in severity.  Prior to this episode she was never experienced chest pain.  She presented to her PCP, Dr. Patsy Lager today and reported the symptoms.  There was initially concerned that it may be gallbladder related.  However troponin was checked and was elevated to 0.54.  She was called and referred to the emergency department.  In the ED her i-STAT troponin was also elevated to 0.48.  EKG revealed sinus rhythm and no ischemia.  She currently reports very mild chest discomfort..  She has not experienced any lower extremity edema, orthopnea, or PND.  Past Medical History:  Diagnosis Date  . Arthritis   . Chicken pox   . Dyslipidemia   . Genital herpes   . GERD (gastroesophageal reflux disease)   . Hepatitis B   . Hypothyroidism   . Kidney stone   . Pancreatitis     Past Surgical History:  Procedure Laterality Date  . APPENDECTOMY  1972  . LAPAROSCOPY    . OVARIAN CYST SURGERY    . TONSILLECTOMY        Medications Prior to Admission: Prior to Admission medications   Medication Sig Start Date End Date Taking? Authorizing Provider  acyclovir (ZOVIRAX) 400 MG tablet Take 1 tablet (400 mg total) by mouth 2 (two) times daily. 06/07/17   Copland, Gwenlyn Found, MD  atorvastatin (LIPITOR) 40 MG tablet Take 1 tablet (40 mg total) by mouth daily. 06/07/17   Copland, Gwenlyn Found, MD  docusate sodium (COLACE) 100 MG capsule Take 100 mg by mouth daily as needed for mild constipation.    [provider]  ibuprofen (ADVIL,MOTRIN) 200 MG tablet Take 200 mg by mouth every 6 (six) hours as needed.    [provider]  levothyroxine (SYNTHROID) 88 MCG tablet Take 1 tablet (88 mcg total) by mouth daily before breakfast. 06/07/17   Copland, Gwenlyn Found, MD  omeprazole (PRILOSEC) 40 MG capsule Take 1 capsule (40 mg total) by mouth daily. 06/07/17   Copland, Gwenlyn Found, MD     Allergies:    Allergies  Allergen Reactions  . Dye Fdc Blue  [Brilliant Blue Fcf] Anaphylaxis    Dye used for CAT scans  . Contrast Media [Iodinated Diagnostic Agents]     Social History:   Social History   Socioeconomic History  . Marital status: Unknown    Spouse name: Not on file  . Number of children: Not on file  . Years of education: Not on file  .  Highest education level: Not on file  Occupational History  . Not on file  Social Needs  . Financial resource strain: Not on file  . Food insecurity:    Worry: Not on file    Inability: Not on file  . Transportation needs:    Medical: Not on file    Non-medical: Not on file  Tobacco Use  . Smoking status: Former Games developermoker  . Smokeless tobacco: Never Used  Substance and Sexual Activity  . Alcohol use: No  . Drug use: No  . Sexual activity: Never  Lifestyle  . Physical activity:    Days per week: Not on file    Minutes per session: Not on file  . Stress: Not on file  Relationships  . Social connections:    Talks on phone: Not on file    Gets together: Not on  file    Attends religious service: Not on file    Active member of club or organization: Not on file    Attends meetings of clubs or organizations: Not on file    Relationship status: Not on file  . Intimate partner violence:    Fear of current or ex partner: Not on file    Emotionally abused: Not on file    Physically abused: Not on file    Forced sexual activity: Not on file  Other Topics Concern  . Not on file  Social History Narrative  . Not on file    Family History:   The patient's family history includes Alcoholism in her father, maternal grandfather, and mother; Heart disease in her father; Heart failure in her father; Hyperlipidemia in her father; Kidney Stones in her mother; Kidney disease in her mother; Liver cancer in her mother.    ROS:  Please see the history of present illness.  All other ROS reviewed and negative.     Physical Exam/Data:   Vitals:   04/15/18 1623 04/15/18 1626 04/15/18 1900  BP:  (!) 139/95 121/86  Pulse:  (!) 101 76  Resp:  18 13  Temp:  98.4 F (36.9 C)   TempSrc:  Oral   SpO2:  100% 98%  Weight: 73.5 kg    Height: 5' (1.524 m)     No intake or output data in the 24 hours ending 04/15/18 1910 Filed Weights   04/15/18 1623  Weight: 73.5 kg   Body mass index is 31.64 kg/m.  VS:  BP 121/86   Pulse 76   Temp 98.4 F (36.9 C) (Oral)   Resp 13   Ht 5' (1.524 m)   Wt 73.5 kg   SpO2 98%   BMI 31.64 kg/m  , BMI Body mass index is 31.64 kg/m. GENERAL:  Well appearing HEENT: Pupils equal round and reactive, fundi not visualized, oral mucosa unremarkable NECK:  No jugular venous distention, waveform within normal limits, carotid upstroke brisk and symmetric, bilateral carotid bruits LUNGS:  Clear to auscultation bilaterally HEART:  RRR.  PMI not displaced or sustained,S1 and S2 within normal limits, no S3, no S4, no clicks, no rubs, II/VI systolic murmur at the LUSB ABD:  Flat, positive bowel sounds normal in frequency in pitch, no  bruits, no rebound, no guarding, no midline pulsatile mass, no hepatomegaly, no splenomegaly EXT:  2 plus pulses throughout, no edema, no cyanosis no clubbing SKIN:  No rashes no nodules NEURO:  Cranial nerves II through XII grossly intact, motor grossly intact throughout PSYCH:  Cognitively intact, oriented to person place and  time   EKG:  The ECG that was done 04/15/2018 was personally reviewed and demonstrates sinus rhythm.  Rate 77 bpm.  Relevant CV Studies: n/a  Laboratory Data:  Chemistry Recent Labs  Lab 04/15/18 1401 04/15/18 1625  NA 138 136  K 4.3 3.6  CL 101 98  CO2 28 25  GLUCOSE 85 88  BUN 8 <5*  CREATININE 0.73 0.77  CALCIUM 9.8 9.5  GFRNONAA  --  >60  GFRAA  --  >60  ANIONGAP  --  13    Recent Labs  Lab 04/15/18 1401  PROT 7.4  ALBUMIN 4.5  AST 21  ALT 15  ALKPHOS 61  BILITOT 0.5   Hematology Recent Labs  Lab 04/15/18 1401 04/15/18 1625  WBC 6.0 6.5  RBC 4.25 4.26  HGB 14.1 13.8  HCT 41.7 41.0  MCV 98.0 96.2  MCH  --  32.4  MCHC 33.8 33.7  RDW 12.0 11.7  PLT 319.0 280   Cardiac EnzymesNo results for input(s): TROPONINI in the last 168 hours.  Recent Labs  Lab 04/15/18 1644 04/15/18 1836  TROPIPOC 0.46* 0.48*    BNPNo results for input(s): BNP, PROBNP in the last 168 hours.  DDimer No results for input(s): DDIMER in the last 168 hours.  Radiology/Studies:  Dg Chest 2 View  Result Date: 04/15/2018 CLINICAL DATA:  Chest pressure. EXAM: CHEST - 2 VIEW COMPARISON:  None. FINDINGS: The heart size and mediastinal contours are within normal limits. Both lungs are clear. The visualized skeletal structures are unremarkable. IMPRESSION: No active cardiopulmonary disease. Electronically Signed   By: Gerome Sam III M.D   On: 04/15/2018 14:22    Assessment and Plan:   # NSTEMI:  # Hyperlipidemia:  Ms. Copper has several weeks of symptoms at rest and with exertion.  Troponin is mildly elevated.  EKG is without acute ischemic changes.  We  will plan for left heart catheterization tomorrow.  We will start metoprolol tartrate 12.5 mg twice daily.  Is her home atorvastatin to 80 mg daily.  Start aspirin 81 daily and a heparin drip.  She has a history of anaphylaxis with contrast dye.  She will require premedication.  Risks and benefits of cardiac catheterization have been discussed with the patient.  The patient understands that risks included but are not limited to stroke (1 in 1000), death (1 in 1000), kidney failure [usually temporary] (1 in 500), bleeding (1 in 200), allergic reaction [possibly serious] (1 in 200). The patient understands and agrees to proceed.   # Carotid bruits: Bilateral bruits noted.  Will check carotid Dopplers.  Aspirin and statin as above.  # Murmur:   Systolic murmur noted on exam.  Concern for mild aortic stenosis.  We will check an echocardiogram this admission.  She has no evidence of heart failure on    Severity of Illness: The appropriate patient status for this patient is INPATIENT. Inpatient status is judged to be reasonable and necessary in order to provide the required intensity of service to ensure the patient's safety. The patient's presenting symptoms, physical exam findings, and initial radiographic and laboratory data in the context of their chronic comorbidities is felt to place them at high risk for further clinical deterioration. Furthermore, it is not anticipated that the patient will be medically stable for discharge from the hospital within 2 midnights of admission. The following factors support the patient status of inpatient.   " The patient's presenting symptoms include chest pain. " The worrisome physical exam  findings include n/a. " The initial radiographic and laboratory data are worrisome because of elevated troponin. " The chronic co-morbidities include hyperlipidemia.   * I certify that at the point of admission it is my clinical judgment that the patient will require inpatient  hospital care spanning beyond 2 midnights from the point of admission due to high intensity of service, high risk for further deterioration and high frequency of surveillance required.*    For questions or updates, please contact [facility] Please consult [email or web address] for contact info under        Signed, Chilton Si, MD  04/15/2018 7:10 PM

## 2018-04-15 NOTE — ED Provider Notes (Signed)
[facility] Provider Note   CSN: [id number] Arrival date & time: 04/15/18  1616     History   Chief Complaint Chief Complaint  Patient presents with  . Chest Pain    HPI [name] is a 67 y.o. female.  The history is provided by the patient. No language interpreter was used.  Chest Pain   This is a new problem. The current episode started more than 1 week ago. The problem occurs every several days. The problem has been gradually worsening. The pain is present in the substernal region. The pain is moderate. The pain radiates to the upper back. Associated symptoms include exertional chest pressure. Pertinent negatives include no abdominal pain, no back pain, no claudication, no cough, no fever, no headaches, no nausea, no numbness, no orthopnea, no palpitations, no shortness of breath, no syncope, no vomiting and no weakness. Treatments tried: tylenol. The treatment provided no relief. Risk factors: dyslipidemia.  Pertinent negatives for past medical history include no CAD and no seizures.    Past Medical History:  Diagnosis Date  . Arthritis   . Chicken pox   . Dyslipidemia   . Genital herpes   . GERD (gastroesophageal reflux disease)   . Hepatitis B   . Hypothyroidism   . Kidney stone   . Pancreatitis     Patient Active Problem List   Diagnosis Date Noted  . NSTEMI (non-ST elevated myocardial infarction) (HCC) 04/15/2018  . Pure hypercholesterolemia   . Bilateral carotid bruits   . Systolic murmur   . Osteopenia 11/30/2017  . GERD (gastroesophageal reflux disease) 06/11/2016  . Osteoarthritis 06/11/2016  . Hypothyroidism (acquired) 06/11/2016  . Dyslipidemia 06/11/2016  . HSV-2 (herpes simplex virus 2) infection 06/11/2016    Past Surgical History:  Procedure Laterality Date  . APPENDECTOMY  1972  . LAPAROSCOPY    . OVARIAN CYST SURGERY    . TONSILLECTOMY       OB History    Gravida  2   Para  1   Term  1   Preterm  0   AB  1   Living  1     SAB  0   TAB  1   Ectopic  0   Multiple  0   Live Births  1            Home Medications    Prior to Admission medications   Medication Sig Start Date End Date Taking? Authorizing Provider  acyclovir (ZOVIRAX) 400 MG tablet Take 1 tablet (400 mg total) by mouth 2 (two) times daily. 06/07/17   Copland, Gwenlyn FoundJessica C, MD  atorvastatin (LIPITOR) 40 MG tablet Take 1 tablet (40 mg total) by mouth daily. 06/07/17   Copland, Gwenlyn FoundJessica C, MD  docusate sodium (COLACE) 100 MG capsule Take 100 mg by mouth daily as needed for mild constipation.    [provider]  ibuprofen (ADVIL,MOTRIN) 200 MG tablet Take 200 mg by mouth every 6 (six) hours as needed.    [provider]  levothyroxine (SYNTHROID) 88 MCG tablet Take 1 tablet (88 mcg total) by mouth daily before breakfast. 06/07/17   Copland, Gwenlyn FoundJessica C, MD  omeprazole (PRILOSEC) 40 MG capsule Take 1 capsule (40 mg total) by mouth daily. 06/07/17   Copland, Gwenlyn FoundJessica C, MD    Family History Family History  Problem Relation Age of Onset  . Alcoholism Mother   . Kidney disease Mother   . Kidney Stones Mother   .  Liver cancer Mother   . Alcoholism Father   . Hyperlipidemia Father   . Heart disease Father   . Heart failure Father   . Alcoholism Maternal Grandfather     Social History Social History   Tobacco Use  . Smoking status: Former Games developer  . Smokeless tobacco: Never Used  Substance Use Topics  . Alcohol use: No  . Drug use: No     Allergies   Dye fdc blue  [brilliant blue fcf] and Contrast media [iodinated diagnostic agents]   Review of Systems Review of Systems  Constitutional: Positive for activity change. Negative for chills and fever.  HENT: Negative for ear pain and sore throat.   Eyes: Negative for pain and visual disturbance.  Respiratory: Negative for cough and shortness of breath.   Cardiovascular: Positive for chest pain. Negative for palpitations,  orthopnea, claudication and syncope.  Gastrointestinal: Negative for abdominal pain, nausea and vomiting.  Genitourinary: Negative for dysuria and hematuria.  Musculoskeletal: Negative for arthralgias and back pain.  Skin: Negative for color change and rash.  Neurological: Negative for seizures, syncope, weakness, numbness and headaches.  All other systems reviewed and are negative.    Physical Exam Updated Vital Signs BP (!) 148/72   Pulse 80   Temp 98.4 F (36.9 C) (Oral)   Resp (!) 21   Ht 5' (1.524 m)   Wt 72.5 kg   SpO2 94%   BMI 31.22 kg/m   Physical Exam  Constitutional: She appears well-developed and well-nourished. No distress.  HENT:  Head: Normocephalic and atraumatic.  Eyes: Conjunctivae are normal.  Neck: Neck supple.  Cardiovascular: Normal rate and regular rhythm.  No murmur heard. Pulmonary/Chest: Effort normal and breath sounds normal. No respiratory distress.  Abdominal: Soft. There is no tenderness.  Musculoskeletal: She exhibits no edema.  Neurological: She is alert.  Skin: Skin is warm and dry.  Psychiatric: She has a normal mood and affect.  Nursing note and vitals reviewed.    ED Treatments / Results  Labs (all labs ordered are listed, but only abnormal results are displayed) Labs Reviewed  BASIC METABOLIC PANEL - Abnormal; Notable for the following components:      Result Value   BUN <5 (*)    All other components within normal limits  TROPONIN I - Abnormal; Notable for the following components:   Troponin I 0.58 (*)    All other components within normal limits  I-STAT TROPONIN, ED - Abnormal; Notable for the following components:   Troponin i, poc 0.46 (*)    All other components within normal limits  I-STAT TROPONIN, ED - Abnormal; Notable for the following components:   Troponin i, poc 0.48 (*)    All other components within normal limits  CBC  TROPONIN I  HEPARIN LEVEL (UNFRACTIONATED)  CBC  HIV ANTIBODY (ROUTINE TESTING W  REFLEX)  TROPONIN I  TROPONIN I  BASIC METABOLIC PANEL  LIPID PANEL  CBG MONITORING, ED    EKG EKG Interpretation  Date/Time:  Monday April 15 2018 18:32:07 EST Ventricular Rate:  77 PR Interval:    QRS Duration: 88 QT Interval:  416 QTC Calculation: 471 R Axis:   7 Text Interpretation:  Sinus rhythm Left ventricular hypertrophy Confirmed by Lorre Nick ([id number]) on 04/15/2018 7:49:19 PM   Radiology Dg Chest 2 View  Result Date: 04/15/2018 CLINICAL DATA:  Chest pressure. EXAM: CHEST - 2 VIEW COMPARISON:  None. FINDINGS: The heart size and mediastinal contours are within normal limits. Both lungs  are clear. The visualized skeletal structures are unremarkable. IMPRESSION: No active cardiopulmonary disease. Electronically Signed   By: Gerome Sam III M.D   On: 04/15/2018 14:22    Procedures Procedures (including critical care time)  Medications Ordered in ED Medications  nitroGLYCERIN (NITROSTAT) SL tablet 0.4 mg (has no administration in time range)  heparin ADULT infusion 100 units/mL (25000 units/259mL sodium chloride 0.45%) (750 Units/hr Intravenous New Bag/Given 04/15/18 2021)  atorvastatin (LIPITOR) tablet 80 mg (has no administration in time range)  levothyroxine (SYNTHROID, LEVOTHROID) tablet 88 mcg (has no administration in time range)  pantoprazole (PROTONIX) EC tablet 40 mg (has no administration in time range)  acyclovir (ZOVIRAX) tablet 400 mg (has no administration in time range)  aspirin EC tablet 81 mg (has no administration in time range)  acetaminophen (TYLENOL) tablet 650 mg (has no administration in time range)  ondansetron (ZOFRAN) injection 4 mg (has no administration in time range)  metoprolol tartrate (LOPRESSOR) tablet 12.5 mg (12.5 mg Oral Given 04/15/18 2155)  sodium chloride flush (NS) 0.9 % injection 3 mL (3 mLs Intravenous Given 04/15/18 2156)  sodium chloride flush (NS) 0.9 % injection 3 mL (has no administration in time range)  0.9 %   sodium chloride infusion (has no administration in time range)  aspirin chewable tablet 81 mg (has no administration in time range)  0.9% sodium chloride infusion (has no administration in time range)    Followed by  0.9% sodium chloride infusion (has no administration in time range)  predniSONE (DELTASONE) tablet 50 mg (50 mg Oral Given 04/15/18 2156)  diphenhydrAMINE (BENADRYL) capsule 50 mg (has no administration in time range)    Or  diphenhydrAMINE (BENADRYL) injection 50 mg (has no administration in time range)  aspirin chewable tablet 324 mg (324 mg Oral Given 04/15/18 1845)  heparin bolus via infusion 3,500 Units (3,500 Units Intravenous Bolus from Bag 04/15/18 2027)     Initial Impression / Assessment and Plan / ED Course  I have reviewed the triage vital signs and the nursing notes.  Pertinent labs & imaging results that were available during my care of the patient were reviewed by me and considered in my medical decision making (see chart for details).    Patient is a 67 year old female with past medical history significant for dyslipidemia presenting for positive troponin with primary care doctors.  Patient states that she was seen at her primary care doctor today for chest pain radiating to her back that was unresolved with p.o. Tylenol at home. EKG shows sinus tachycardia, no new ST segment elevations or T wave inversions.  Patient given ASA 324mg , nitroglycerin for pain. Patient is pain free at this time. Heparin bolus and gtt started. Cardiology was consulted and patient will be admitted for further observation and management.    Final Clinical Impressions(s) / ED Diagnoses   Final diagnoses:  NSTEMI (non-ST elevated myocardial infarction) [facility])    ED Discharge Orders    None       Joaquin Courts, MD 04/15/18 2350    Lorre Nick, MD 04/16/18 1133

## 2018-04-15 NOTE — Telephone Encounter (Signed)
Noted and handled

## 2018-04-15 NOTE — ED Triage Notes (Signed)
Per pt she has been having chest pain in the right side that radiated to the shoulder and right arm. Pt stated this has been going on for a month. No nausea, no vomiting, no fevers. Alert oriented x 4

## 2018-04-16 ENCOUNTER — Ambulatory Visit (HOSPITAL_BASED_OUTPATIENT_CLINIC_OR_DEPARTMENT_OTHER): Payer: Medicare Other

## 2018-04-16 ENCOUNTER — Inpatient Hospital Stay (HOSPITAL_COMMUNITY): Payer: Medicare Other

## 2018-04-16 ENCOUNTER — Encounter (HOSPITAL_COMMUNITY): Payer: Self-pay | Admitting: Cardiovascular Disease

## 2018-04-16 ENCOUNTER — Other Ambulatory Visit: Payer: Self-pay | Admitting: *Deleted

## 2018-04-16 ENCOUNTER — Encounter (HOSPITAL_COMMUNITY)
Admission: EM | Disposition: A | Discharge status: Home / Self Care | Payer: Self-pay | Source: Home / Self Care | Attending: Cardiothoracic Surgery

## 2018-04-16 DIAGNOSIS — R079 Chest pain, unspecified: Secondary | ICD-10-CM

## 2018-04-16 DIAGNOSIS — I2510 Atherosclerotic heart disease of native coronary artery without angina pectoris: Secondary | ICD-10-CM

## 2018-04-16 DIAGNOSIS — Z01810 Encounter for preprocedural cardiovascular examination: Secondary | ICD-10-CM

## 2018-04-16 DIAGNOSIS — I214 Non-ST elevation (NSTEMI) myocardial infarction: Secondary | ICD-10-CM

## 2018-04-16 DIAGNOSIS — I25110 Atherosclerotic heart disease of native coronary artery with unstable angina pectoris: Secondary | ICD-10-CM

## 2018-04-16 DIAGNOSIS — E782 Mixed hyperlipidemia: Secondary | ICD-10-CM

## 2018-04-16 HISTORY — PX: LEFT HEART CATH AND CORONARY ANGIOGRAPHY: CATH118249

## 2018-04-16 LAB — PULMONARY FUNCTION TEST
FEF 25-75 Post: 2.26 L/sec
FEF 25-75 Pre: 1.41 L/sec
FEF2575-%Change-Post: 59 %
FEF2575-%Pred-Post: 130 %
FEF2575-%Pred-Pre: 82 %
FEV1-%Change-Post: 11 %
FEV1-%PRED-PRE: 91 %
FEV1-%Pred-Post: 102 %
FEV1-Post: 1.98 L
FEV1-Pre: 1.77 L
FEV1FVC-%Change-Post: 5 %
FEV1FVC-%Pred-Pre: 100 %
FEV6-%Change-Post: 7 %
FEV6-%Pred-Post: 101 %
FEV6-%Pred-Pre: 94 %
FEV6-POST: 2.48 L
FEV6-Pre: 2.31 L
FEV6FVC-%Change-Post: 0 %
FEV6FVC-%PRED-POST: 104 %
FEV6FVC-%Pred-Pre: 104 %
FVC-%Change-Post: 6 %
FVC-%Pred-Post: 96 %
FVC-%Pred-Pre: 90 %
FVC-Post: 2.48 L
FVC-Pre: 2.32 L
Post FEV1/FVC ratio: 80 %
Post FEV6/FVC ratio: 100 %
Pre FEV1/FVC ratio: 76 %
Pre FEV6/FVC Ratio: 99 %

## 2018-04-16 LAB — LIPID PANEL
Cholesterol: 215 mg/dL — ABNORMAL HIGH (ref 0–200)
HDL: 72 mg/dL (ref >40)
LDL Cholesterol: 129 mg/dL — ABNORMAL HIGH (ref 0–99)
Total CHOL/HDL Ratio: 3 RATIO
Triglycerides: 69 mg/dL (ref <150)
VLDL: 14 mg/dL (ref 0–40)

## 2018-04-16 LAB — URINALYSIS, ROUTINE W REFLEX MICROSCOPIC
Bacteria, UA: NONE SEEN
Bilirubin Urine: NEGATIVE
Glucose, UA: NEGATIVE mg/dL
Ketones, ur: NEGATIVE mg/dL
Nitrite: NEGATIVE
Protein, ur: NEGATIVE mg/dL
Specific Gravity, Urine: 1.024 (ref 1.005–1.030)
pH: 7 (ref 5.0–8.0)

## 2018-04-16 LAB — COMPREHENSIVE METABOLIC PANEL
ALT: 17 U/L (ref 0–44)
AST: 29 U/L (ref 15–41)
Albumin: 3.8 g/dL (ref 3.5–5.0)
Alkaline Phosphatase: 49 U/L (ref 38–126)
Anion gap: 10 (ref 5–15)
BUN: 12 mg/dL (ref 8–23)
CO2: 22 mmol/L (ref 22–32)
Calcium: 9.5 mg/dL (ref 8.9–10.3)
Chloride: 105 mmol/L (ref 98–111)
Creatinine, Ser: 0.92 mg/dL (ref 0.44–1.00)
GFR calc Af Amer: >60 mL/min (ref >60)
GFR calc non Af Amer: >60 mL/min (ref >60)
Glucose, Bld: 175 mg/dL — ABNORMAL HIGH (ref 70–99)
Potassium: 4 mmol/L (ref 3.5–5.1)
Sodium: 137 mmol/L (ref 135–145)
Total Bilirubin: 0.5 mg/dL (ref 0.3–1.2)
Total Protein: 6.6 g/dL (ref 6.5–8.1)

## 2018-04-16 LAB — HEMOGLOBIN A1C
Hgb A1c MFr Bld: 5.3 % (ref 4.8–5.6)
Mean Plasma Glucose: 105.41 mg/dL

## 2018-04-16 LAB — ECHOCARDIOGRAM COMPLETE
Height: 60 in
Weight: 2557.34 oz

## 2018-04-16 LAB — HEPARIN LEVEL (UNFRACTIONATED)
Heparin Unfractionated: 0.54 IU/mL (ref 0.30–0.70)
Heparin Unfractionated: 0.51 IU/mL (ref 0.30–0.70)
Heparin Unfractionated: 0.19 IU/mL — ABNORMAL LOW (ref 0.30–0.70)

## 2018-04-16 LAB — BASIC METABOLIC PANEL
Anion gap: 9 (ref 5–15)
BUN: 8 mg/dL (ref 8–23)
CHLORIDE: 103 mmol/L (ref 98–111)
CO2: 25 mmol/L (ref 22–32)
Calcium: 9.2 mg/dL (ref 8.9–10.3)
Creatinine, Ser: 0.92 mg/dL (ref 0.44–1.00)
GFR calc Af Amer: >60 mL/min (ref >60)
GFR calc non Af Amer: >60 mL/min (ref >60)
Glucose, Bld: 145 mg/dL — ABNORMAL HIGH (ref 70–99)
Potassium: 3.3 mmol/L — ABNORMAL LOW (ref 3.5–5.1)
SODIUM: 137 mmol/L (ref 135–145)

## 2018-04-16 LAB — BLOOD GAS, ARTERIAL
Acid-base deficit: 3.5 mmol/L — ABNORMAL HIGH (ref 0.0–2.0)
Bicarbonate: 20.3 mmol/L (ref 20.0–28.0)
Drawn by: 51133
FIO2: 21
O2 Saturation: 92.8 %
Patient temperature: 98.6
pCO2 arterial: 32.5 mmHg (ref 32.0–48.0)
pH, Arterial: 7.413 (ref 7.350–7.450)
pO2, Arterial: 66.4 mmHg — ABNORMAL LOW (ref 83.0–108.0)

## 2018-04-16 LAB — TROPONIN I
Troponin I: 0.34 ng/mL (ref <0.03)
Troponin I: 0.19 ng/mL (ref <0.03)

## 2018-04-16 LAB — MRSA PCR SCREENING: MRSA by PCR: NEGATIVE

## 2018-04-16 LAB — CBC
HCT: 42.7 % (ref 36.0–46.0)
Hemoglobin: 13.8 g/dL (ref 12.0–15.0)
MCH: 31.3 pg (ref 26.0–34.0)
MCHC: 32.3 g/dL (ref 30.0–36.0)
MCV: 96.8 fL (ref 80.0–100.0)
PLATELETS: 311 10*3/uL (ref 150–400)
RBC: 4.41 MIL/uL (ref 3.87–5.11)
RDW: 11.7 % (ref 11.5–15.5)
WBC: 4.5 10*3/uL (ref 4.0–10.5)
nRBC: 0 % (ref 0.0–0.2)

## 2018-04-16 LAB — TYPE AND SCREEN
ABO/RH(D): A POS
Antibody Screen: NEGATIVE

## 2018-04-16 LAB — ABO/RH: ABO/RH(D): A POS

## 2018-04-16 LAB — APTT: aPTT: 46 seconds — ABNORMAL HIGH (ref 24–36)

## 2018-04-16 LAB — HIV ANTIBODY (ROUTINE TESTING W REFLEX): HIV Screen 4th Generation wRfx: NONREACTIVE

## 2018-04-16 LAB — PROTIME-INR
INR: 0.96
Prothrombin Time: 12.7 seconds (ref 11.4–15.2)

## 2018-04-16 SURGERY — LEFT HEART CATH AND CORONARY ANGIOGRAPHY
Anesthesia: LOCAL

## 2018-04-16 MED ORDER — CHLORHEXIDINE GLUCONATE CLOTH 2 % EX PADS: 6.0000 | MEDICATED_PAD | Freq: Once | CUTANEOUS | Status: AC

## 2018-04-16 MED ORDER — CHLORHEXIDINE GLUCONATE 0.12 % MT SOLN
15.0000 mL | Freq: Once | OROMUCOSAL | Status: AC
  Administered 2018-04-17: 15 mL via OROMUCOSAL
  Filled 2018-04-16: qty 15

## 2018-04-16 MED ORDER — ALBUTEROL SULFATE (2.5 MG/3ML) 0.083% IN NEBU
2.5000 mg | INHALATION_SOLUTION | Freq: Once | RESPIRATORY_TRACT | Status: AC
  Administered 2018-04-16: 2.5 mg via RESPIRATORY_TRACT

## 2018-04-16 MED ORDER — PLASMA-LYTE 148 IV SOLN
INTRAVENOUS | Status: AC
  Administered 2018-04-17: 500 mL
  Filled 2018-04-16: qty 2.5

## 2018-04-16 MED ORDER — TRANEXAMIC ACID 1000 MG/10ML IV SOLN
1.5000 mg/kg/h | INTRAVENOUS | Status: AC
  Administered 2018-04-17: 1.5 mg/kg/h via INTRAVENOUS
  Filled 2018-04-16: qty 25

## 2018-04-16 MED ORDER — HEPARIN (PORCINE) IN NACL 1000-0.9 UT/500ML-% IV SOLN
INTRAVENOUS | Status: AC
  Filled 2018-04-16: qty 1000

## 2018-04-16 MED ORDER — MIDAZOLAM HCL 2 MG/2ML IJ SOLN
INTRAMUSCULAR | Status: DC | PRN
  Administered 2018-04-16: 1 mg via INTRAVENOUS

## 2018-04-16 MED ORDER — SODIUM CHLORIDE 0.9 % IV SOLN
INTRAVENOUS | Status: DC
  Filled 2018-04-16: qty 30

## 2018-04-16 MED ORDER — SODIUM CHLORIDE 0.9 % IV SOLN
INTRAVENOUS | Status: AC
  Administered 2018-04-16: 12:00:00 via INTRAVENOUS

## 2018-04-16 MED ORDER — MILRINONE LACTATE IN DEXTROSE 20-5 MG/100ML-% IV SOLN
0.3000 ug/kg/min | INTRAVENOUS | Status: DC
  Filled 2018-04-16: qty 100

## 2018-04-16 MED ORDER — CHLORHEXIDINE GLUCONATE CLOTH 2 % EX PADS
6.0000 | MEDICATED_PAD | Freq: Once | CUTANEOUS | Status: AC
  Administered 2018-04-16: 6 via TOPICAL

## 2018-04-16 MED ORDER — POTASSIUM CHLORIDE 2 MEQ/ML IV SOLN
80.0000 meq | INTRAVENOUS | Status: DC
  Filled 2018-04-16: qty 40

## 2018-04-16 MED ORDER — BISACODYL 5 MG PO TBEC
5.0000 mg | DELAYED_RELEASE_TABLET | Freq: Once | ORAL | Status: AC
  Administered 2018-04-16: 5 mg via ORAL
  Filled 2018-04-16: qty 1

## 2018-04-16 MED ORDER — EPINEPHRINE PF 1 MG/ML IJ SOLN
0.0000 ug/min | INTRAVENOUS | Status: DC
  Filled 2018-04-16: qty 4

## 2018-04-16 MED ORDER — LIDOCAINE HCL (PF) 1 % IJ SOLN
INTRAMUSCULAR | Status: AC
  Filled 2018-04-16: qty 30

## 2018-04-16 MED ORDER — SODIUM CHLORIDE 0.9% FLUSH
3.0000 mL | Freq: Two times a day (BID) | INTRAVENOUS | Status: DC
  Administered 2018-04-16: 3 mL via INTRAVENOUS

## 2018-04-16 MED ORDER — FENTANYL CITRATE (PF) 100 MCG/2ML IJ SOLN
INTRAMUSCULAR | Status: DC | PRN
  Administered 2018-04-16: 25 ug via INTRAVENOUS

## 2018-04-16 MED ORDER — MIDAZOLAM HCL 2 MG/2ML IJ SOLN
INTRAMUSCULAR | Status: AC
  Filled 2018-04-16: qty 2

## 2018-04-16 MED ORDER — INSULIN REGULAR(HUMAN) IN NACL 100-0.9 UT/100ML-% IV SOLN
INTRAVENOUS | Status: AC
  Administered 2018-04-17: .7 [IU]/h via INTRAVENOUS
  Filled 2018-04-16: qty 100

## 2018-04-16 MED ORDER — POTASSIUM CHLORIDE CRYS ER 20 MEQ PO TBCR
40.0000 meq | EXTENDED_RELEASE_TABLET | ORAL | Status: DC
  Administered 2018-04-16 – 2018-04-17 (×6): 40 meq via ORAL
  Filled 2018-04-16 (×5): qty 2

## 2018-04-16 MED ORDER — IOHEXOL 350 MG/ML SOLN
INTRAVENOUS | Status: DC | PRN
  Administered 2018-04-16: 85 mL via INTRAVENOUS

## 2018-04-16 MED ORDER — PHENYLEPHRINE HCL-NACL 20-0.9 MG/250ML-% IV SOLN
30.0000 ug/min | INTRAVENOUS | Status: AC
  Administered 2018-04-17: 20 ug/min via INTRAVENOUS
  Filled 2018-04-16: qty 250

## 2018-04-16 MED ORDER — TRANEXAMIC ACID (OHS) BOLUS VIA INFUSION
15.0000 mg/kg | INTRAVENOUS | Status: AC
  Administered 2018-04-17: 1087.5 mg via INTRAVENOUS
  Filled 2018-04-16: qty 1088

## 2018-04-16 MED ORDER — HEPARIN (PORCINE) 25000 UT/250ML-% IV SOLN
850.0000 [IU]/h | INTRAVENOUS | Status: DC
  Administered 2018-04-16: 750 [IU]/h via INTRAVENOUS

## 2018-04-16 MED ORDER — VERAPAMIL HCL 2.5 MG/ML IV SOLN
INTRAVENOUS | Status: DC | PRN
  Administered 2018-04-16: 10 mL via INTRA_ARTERIAL

## 2018-04-16 MED ORDER — TRANEXAMIC ACID (OHS) PUMP PRIME SOLUTION
2.0000 mg/kg | INTRAVENOUS | Status: DC
  Filled 2018-04-16: qty 1.45

## 2018-04-16 MED ORDER — METOPROLOL TARTRATE 12.5 MG HALF TABLET
12.5000 mg | ORAL_TABLET | Freq: Once | ORAL | Status: AC
  Administered 2018-04-17: 12.5 mg via ORAL
  Filled 2018-04-16: qty 1

## 2018-04-16 MED ORDER — HEPARIN (PORCINE) IN NACL 1000-0.9 UT/500ML-% IV SOLN
INTRAVENOUS | Status: DC | PRN
  Administered 2018-04-16 (×2): 500 mL

## 2018-04-16 MED ORDER — VANCOMYCIN HCL 10 G IV SOLR
1250.0000 mg | INTRAVENOUS | Status: AC
  Administered 2018-04-17: 1250 mg via INTRAVENOUS
  Filled 2018-04-16: qty 1250

## 2018-04-16 MED ORDER — MAGNESIUM SULFATE 50 % IJ SOLN
40.0000 meq | INTRAMUSCULAR | Status: DC
  Filled 2018-04-16: qty 9.85

## 2018-04-16 MED ORDER — FENTANYL CITRATE (PF) 100 MCG/2ML IJ SOLN
INTRAMUSCULAR | Status: AC
  Filled 2018-04-16: qty 2

## 2018-04-16 MED ORDER — SODIUM CHLORIDE 0.9 % IV SOLN
1.5000 g | INTRAVENOUS | Status: AC
  Administered 2018-04-17: 1.5 g via INTRAVENOUS
  Filled 2018-04-16: qty 1.5

## 2018-04-16 MED ORDER — SODIUM CHLORIDE 0.9 % IV SOLN: 250.0000 mL | INTRAVENOUS | Status: DC | PRN

## 2018-04-16 MED ORDER — DEXMEDETOMIDINE HCL IN NACL 400 MCG/100ML IV SOLN
0.1000 ug/kg/h | INTRAVENOUS | Status: AC
  Administered 2018-04-17: .3 ug/kg/h via INTRAVENOUS
  Filled 2018-04-16: qty 100

## 2018-04-16 MED ORDER — HEPARIN SODIUM (PORCINE) 1000 UNIT/ML IJ SOLN
INTRAMUSCULAR | Status: DC | PRN
  Administered 2018-04-16: 4000 [IU] via INTRAVENOUS

## 2018-04-16 MED ORDER — LIDOCAINE HCL (PF) 1 % IJ SOLN
INTRAMUSCULAR | Status: DC | PRN
  Administered 2018-04-16: 2 mL

## 2018-04-16 MED ORDER — SODIUM CHLORIDE 0.9% FLUSH: 3.0000 mL | INTRAVENOUS | Status: DC | PRN

## 2018-04-16 MED ORDER — DOPAMINE-DEXTROSE 3.2-5 MG/ML-% IV SOLN
0.0000 ug/kg/min | INTRAVENOUS | Status: DC
  Filled 2018-04-16: qty 250

## 2018-04-16 MED ORDER — SODIUM CHLORIDE 0.9 % IV SOLN
750.0000 mg | INTRAVENOUS | Status: DC
  Filled 2018-04-16: qty 750

## 2018-04-16 MED ORDER — NITROGLYCERIN IN D5W 200-5 MCG/ML-% IV SOLN
2.0000 ug/min | INTRAVENOUS | Status: DC
  Filled 2018-04-16: qty 250

## 2018-04-16 MED ORDER — VERAPAMIL HCL 2.5 MG/ML IV SOLN
INTRAVENOUS | Status: AC
  Filled 2018-04-16: qty 2

## 2018-04-16 SURGICAL SUPPLY — 11 items
CATH 5FR JL3.5 JR4 ANG PIG MP (CATHETERS) ×2 IMPLANT
DEVICE RAD COMP TR BAND LRG (VASCULAR PRODUCTS) ×2 IMPLANT
GLIDESHEATH SLEND SS 6F .021 (SHEATH) ×2 IMPLANT
GUIDEWIRE INQWIRE 1.5J.035X260 (WIRE) ×1 IMPLANT
INQWIRE 1.5J .035X260CM (WIRE) ×2
KIT HEART LEFT (KITS) ×2 IMPLANT
PACK CARDIAC CATHETERIZATION (CUSTOM PROCEDURE TRAY) ×2 IMPLANT
SYR MEDRAD MARK 7 150ML (SYRINGE) ×2 IMPLANT
TRANSDUCER W/STOPCOCK (MISCELLANEOUS) ×2 IMPLANT
TUBING CIL FLEX 10 FLL-RA (TUBING) ×2 IMPLANT
WIRE HI TORQ VERSACORE-J 145CM (WIRE) ×2 IMPLANT

## 2018-04-16 NOTE — Progress Notes (Signed)
ANTICOAGULATION CONSULT NOTE - Follow Up Consult  Pharmacy Consult for heparin Indication: NSTEMI  Labs: Recent Labs    04/15/18 1401 04/15/18 1625 04/15/18 2115 04/16/18 0229  HGB 14.1 13.8  --  13.8  HCT 41.7 41.0  --  42.7  PLT 319.0 280  --  311  HEPARINUNFRC  --   --   --  0.54  CREATININE 0.73 0.77  --   --   TROPONINI  --   --  0.58*  --     Assessment/Plan:  67yo female therapeutic on heparin with initial dosing for NSTEMI. Will continue gtt at current rate and confirm stable with additional level.   [name], PharmD, BCPS  04/16/2018,3:33 AM

## 2018-04-16 NOTE — Progress Notes (Signed)
 Progress Note  Patient Name: [name] Date of Encounter: 04/16/2018  Primary Cardiologist: Tiffany Raritan, MD   Subjective   [name] was admitted to the ER by Dr.  last night with non-STEMI.  She is currently on IV heparin.  Her troponins are low and flat although she does have anterior T wave inversion on her twelve-lead EKG.  She is currently asymptomatic and scheduled for cardiac catheterization later today.  Inpatient Medications    Scheduled Meds: . acyclovir  400 mg Oral BID  . aspirin EC  81 mg Oral Daily  . atorvastatin  80 mg Oral q1800  . diphenhydrAMINE  50 mg Oral Once   Or  . diphenhydrAMINE  50 mg Intravenous Once  . levothyroxine  88 mcg Oral QAC breakfast  . metoprolol tartrate  12.5 mg Oral BID  . pantoprazole  40 mg Oral Daily  . potassium chloride  40 mEq Oral Q4H  . predniSONE  50 mg Oral Q6H  . sodium chloride flush  3 mL Intravenous Q12H   Continuous Infusions: . sodium chloride    . sodium chloride 1 mL/kg/hr (04/16/18 0500)  . heparin 750 Units/hr (04/15/18 2021)   PRN Meds: sodium chloride, acetaminophen, nitroGLYCERIN, ondansetron (ZOFRAN) IV, sodium chloride flush   Vital Signs    Vitals:   04/15/18 2015 04/15/18 2155 04/16/18 0013 04/16/18 0805  BP: 100/82 (!) 148/72 (!) 116/55 127/74  Pulse: 84 80 75   Resp: (!) 21     Temp:    97.7 F (36.5 C)  TempSrc:    Oral  SpO2: 94%  96%   Weight:  72.5 kg    Height:        Intake/Output Summary (Last 24 hours) at 04/16/2018 0915 Last data filed at 04/15/2018 2200 Gross per 24 hour  Intake 46.28 ml  Output -  Net 46.28 ml   Filed Weights   04/15/18 1623 04/15/18 2155  Weight: 73.5 kg 72.5 kg    Telemetry    Sinus rhythm- Personally Reviewed  ECG    Sinus rhythm at 71 with anterior T wave inversion- Personally Reviewed  Physical Exam   GEN: No acute distress.   Neck: No JVD Cardiac: RRR, no murmurs, rubs, or gallops.  Respiratory: Clear to auscultation  bilaterally. GI: Soft, nontender, non-distended  MS: No edema; No deformity. Neuro:  Nonfocal  Psych: Normal affect   Labs    Chemistry Recent Labs  Lab 04/15/18 1401 04/15/18 1625 04/16/18 0229  NA 138 136 137  K 4.3 3.6 3.3*  CL 101 98 103  CO2 28 25 25  GLUCOSE 85 88 145*  BUN 8 <5* 8  CREATININE 0.73 0.77 0.92  CALCIUM 9.8 9.5 9.2  PROT 7.4  --   --   ALBUMIN 4.5  --   --   AST 21  --   --   ALT 15  --   --   ALKPHOS 61  --   --   BILITOT 0.5  --   --   GFRNONAA  --  >60 >60  GFRAA  --  >60 >60  ANIONGAP  --  13 9     Hematology Recent Labs  Lab 04/15/18 1401 04/15/18 1625 04/16/18 0229  WBC 6.0 6.5 4.5  RBC 4.25 4.26 4.41  HGB 14.1 13.8 13.8  HCT 41.7 41.0 42.7  MCV 98.0 96.2 96.8  MCH  --  32.4 31.3  MCHC 33.8 33.7 32.3  RDW 12.0 11.7 11.7    PLT 319.0 280 311    Cardiac Enzymes Recent Labs  Lab 04/15/18 2115 04/16/18 0229  TROPONINI 0.58* 0.34*    Recent Labs  Lab 04/15/18 1644 04/15/18 1836  TROPIPOC 0.46* 0.48*     BNPNo results for input(s): BNP, PROBNP in the last 168 hours.   DDimer No results for input(s): DDIMER in the last 168 hours.   Radiology    Dg Chest 2 View  Result Date: 04/15/2018 CLINICAL DATA:  Chest pressure. EXAM: CHEST - 2 VIEW COMPARISON:  None. FINDINGS: The heart size and mediastinal contours are within normal limits. Both lungs are clear. The visualized skeletal structures are unremarkable. IMPRESSION: No active cardiopulmonary disease. Electronically Signed   By: David  Williams III M.D   On: 04/15/2018 14:22    Cardiac Studies   None yet  Patient Profile     67 y.o. female without prior cardiac history on statin drugs and thyroid replacement therapy who is been having back pain and some atypical chest pain.  She was admitted to the ER by Dr. [place] yesterday evening with low-grade positive troponins and anterior T wave inversion on her twelve-lead EKG.  She is currently asymptomatic on IV  heparin.  Assessment & Plan    1: Non-STEMI- troponin II 0.58, low and flat.  Positive risk factors.  Currently on IV heparin and asymptomatic with anterior T wave inversion on her twelve-lead EKG.  Plans for diagnostic coronary angiography later today.  2: Hyperlipidemia- LDL of 129 on atorvastatin 40 mg at home.  She will most likely need to be on Repatha or another PCSK9  3: Carotid bruits- carotid Dopplers ordered  4: Hypokalemia- potassium 3.3.  Repleted  For questions or updates, please contact [facility] Please consult [email or web address] for contact info under        Signed, Kijana Cromie, MD  04/16/2018, 9:15 AM    

## 2018-04-16 NOTE — Progress Notes (Signed)
ANTICOAGULATION CONSULT NOTE   Pharmacy Consult for heparin Indication: chest pain/ACS  Allergies  Allergen Reactions  . Dye Fdc Blue  [Brilliant Blue Fcf] Anaphylaxis    Dye used for CAT scans  . Contrast Media [Iodinated Diagnostic Agents]     Patient Measurements: Height: 5' (152.4 cm) Weight: 159 lb 13.3 oz (72.5 kg) IBW/kg (Calculated) : 45.5 Heparin Dosing Weight: 62 kg  Vital Signs: Temp: 98.2 F (36.8 C) (12/03 1200) Temp Source: Oral (12/03 1200) BP: 130/73 (12/03 1200) Pulse Rate: 73 (12/03 1200)  Labs: Recent Labs    04/15/18 1401 04/15/18 1625 04/15/18 2115 04/16/18 0229 04/16/18 0839  HGB 14.1 13.8  --  13.8  --   HCT 41.7 41.0  --  42.7  --   PLT 319.0 280  --  311  --   HEPARINUNFRC  --   --   --  0.54 0.51  CREATININE 0.73 0.77  --  0.92  --   TROPONINI  --   --  0.58* 0.34* 0.19*    Estimated Creatinine Clearance: 51.3 mL/min (by C-G formula based on SCr of 0.92 mg/dL).   Medical History: Past Medical History:  Diagnosis Date  . Arthritis   . Chicken pox   . Dyslipidemia   . Genital herpes   . GERD (gastroesophageal reflux disease)   . Hepatitis B   . Hypothyroidism   . Kidney stone   . Pancreatitis     Medications:  Medications Prior to Admission  Medication Sig Dispense Refill Last Dose  . acyclovir (ZOVIRAX) 400 MG tablet Take 1 tablet (400 mg total) by mouth 2 (two) times daily. 180 tablet 3 Taking  . atorvastatin (LIPITOR) 40 MG tablet Take 1 tablet (40 mg total) by mouth daily. 90 tablet 3 Taking  . docusate sodium (COLACE) 100 MG capsule Take 100 mg by mouth daily as needed for mild constipation.   Taking  . ibuprofen (ADVIL,MOTRIN) 200 MG tablet Take 200 mg by mouth every 6 (six) hours as needed.   Taking  . levothyroxine (SYNTHROID) 88 MCG tablet Take 1 tablet (88 mcg total) by mouth daily before breakfast. 90 tablet 3 Taking  . omeprazole (PRILOSEC) 40 MG capsule Take 1 capsule (40 mg total) by mouth daily. 90 capsule 3  Taking    Assessment: [age] YOF who presented with chest pain. Initial troponin is elevated. Pharmacy consulted to start IV heparin for ACS. H/H and Plt wnl. Patient states she is not on any anticoagulants at home.   Heparin level at goal x 2, no issues noted. Patient is s/p cath with multivessel CAD and plans for CABG.  Will restart heaprin 8hr after radial sheath removed - out at 130 restart at 1630.    Goal of Therapy:  Heparin level 0.3-0.7 units/ml Monitor platelets by anticoagulation protocol: Yes   Plan:  Heparin drip 750 uts/hr HL in 6hr after restart  Daily HL, CBC  Leota SauersLisa Teneil Shiller Pharm.D. CPP, BCPS Clinical Pharmacist [phone] 04/16/2018 1:55 PM

## 2018-04-16 NOTE — Interval H&P Note (Signed)
History and Physical Interval Note:  04/16/2018 9:51 AM  [name]  has presented today for cardiac cath with the diagnosis of unstable angina/NSTEMI. The various methods of treatment have been discussed with the patient and family. After consideration of risks, benefits and other options for treatment, the patient has consented to  Procedure(s): LEFT HEART CATH AND CORONARY ANGIOGRAPHY (N/A) as a surgical intervention .  The patient's history has been reviewed, patient examined, no change in status, stable for surgery.  I have reviewed the patient's chart and labs.  Questions were answered to the patient's satisfaction.    Cath Lab Visit (complete for each Cath Lab visit)  Clinical Evaluation Leading to the Procedure:   ACS: Yes.     Non-ACS:    Anginal Classification: CCS III  Anti-ischemic medical therapy: No Therapy  Non-Invasive Test Results: No non-invasive testing performed  Prior CABG: No previous CABG         Verne Carrowhristopher McAlhany

## 2018-04-16 NOTE — Progress Notes (Signed)
ANTICOAGULATION CONSULT NOTE   Pharmacy Consult for Heparin Indication: chest pain/ACS  Allergies  Allergen Reactions  . Dye Fdc Blue  [Brilliant Blue Fcf] Anaphylaxis    Dye used for CAT scans  . Contrast Media [Iodinated Diagnostic Agents]     Patient Measurements: Height: 5' (152.4 cm) Weight: 159 lb 13.3 oz (72.5 kg) IBW/kg (Calculated) : 45.5 Heparin Dosing Weight: 62 kg  Vital Signs: Temp: 98.7 F (37.1 C) (12/03 1947) Temp Source: Oral (12/03 1947) BP: 94/55 (12/03 1950) Pulse Rate: 94 (12/03 1950)  Labs: Recent Labs    04/15/18 1401 04/15/18 1625 04/15/18 2115 04/16/18 0229 04/16/18 0839 04/16/18 1832 04/16/18 2047 04/16/18 2059  HGB 14.1 13.8  --  13.8  --   --   --   --   HCT 41.7 41.0  --  42.7  --   --   --   --   PLT 319.0 280  --  311  --   --   --   --   APTT  --   --   --   --   --   --  46*  --   LABPROT  --   --   --   --   --  12.7  --   --   INR  --   --   --   --   --  0.96  --   --   HEPARINUNFRC  --   --   --  0.54 0.51  --   --  0.19*  CREATININE 0.73 0.77  --  0.92  --  0.92  --   --   TROPONINI  --   --  0.58* 0.34* 0.19*  --   --   --     Estimated Creatinine Clearance: 51.3 mL/min (by C-G formula based on SCr of 0.92 mg/dL).   Medical History: Past Medical History:  Diagnosis Date  . Arthritis   . Chicken pox   . Dyslipidemia   . Genital herpes   . GERD (gastroesophageal reflux disease)   . Hepatitis B   . Hypothyroidism   . Kidney stone   . Pancreatitis     Medications:  Medications Prior to Admission  Medication Sig Dispense Refill Last Dose  . acyclovir (ZOVIRAX) 400 MG tablet Take 1 tablet (400 mg total) by mouth 2 (two) times daily. 180 tablet 3 Taking  . atorvastatin (LIPITOR) 40 MG tablet Take 1 tablet (40 mg total) by mouth daily. 90 tablet 3 Taking  . docusate sodium (COLACE) 100 MG capsule Take 100 mg by mouth daily as needed for mild constipation.   Taking  . ibuprofen (ADVIL,MOTRIN) 200 MG tablet Take 200 mg  by mouth every 6 (six) hours as needed.   Taking  . levothyroxine (SYNTHROID) 88 MCG tablet Take 1 tablet (88 mcg total) by mouth daily before breakfast. 90 tablet 3 Taking  . omeprazole (PRILOSEC) 40 MG capsule Take 1 capsule (40 mg total) by mouth daily. 90 capsule 3 Taking    Assessment: [age] YOF who presented with chest pain. Initial troponin is elevated. Pharmacy consulted to start IV heparin for ACS. H/H and Plt wnl. Patient states she is not on any anticoagulants at home.   Heparin level at goal x 2, no issues noted. Patient is s/p cath with multivessel CAD and plans for CABG.  Will restart heaprin 8hr after radial sheath removed - out at 130 restart at 1630.    12/3 PM  update: heparin level low after re-start s/p cath, plans for CABG, no issues per RN.   Goal of Therapy:  Heparin level 0.3-0.7 units/ml Monitor platelets by anticoagulation protocol: Yes   Plan:  Inc heparin to 850 units/hr Re-check heparin level with AM labs  Abran Duke, PharmD, BCPS Clinical Pharmacist Phone: [phone]

## 2018-04-16 NOTE — Consult Note (Signed)
[address]       [place] [address]             [phone]        [name] [facility] Medical Record #[id number] Date of Birth: 06-03-1950  Referring:Dr. Clifton James Primary Care: Copland, Kimberly Found, MD Primary Cardiologist:Kimberly Duke Salvia, MD  Chief Complaint:    Chief Complaint  Patient presents with  . Chest Pain   History of Present Illness:      [name] is a 67 yo white female with known history of Aortic Stenosis, Hypothyroidism, Hyperlipidemia, Hepatitis B, H/O Alcoholism quite drinking in 1991, H/o Pancreatitis, Nicotine abuse of 3-4 ppd for 27 years quitting in 1992, and Genital Herpes.  She presented to her PCP with a 1 month complaint of right sided chest pain with radiation to her right arm and back.  She denied shortness of breath, palpitations, nausea and vomiting.  The PCP obtained troponin level which came back positive and she was called to present to the ED.  Upon arrival EKG showed no acute ST changes.  Her repeat Troponin level was 0.58.  She was ruled in for NSTEMI and admitted for further care.  She underwent cardiac catheterization which showed multivessel CAD which was felt to be amenable to CABG and Cardiothoracic surgery consult was requested.  The patient currently is chest pain free.  She states her chest pain has been occurring at rest and activity.  She admits to some musculoskeletal pain which has been present for quite a while but hasn't improved.  She is physically active participating in silver sneakers or chair yoga 5x per week.  She denies any recent alcohol intake and states she has not had pancreatitis since she quit drinking.  She also denies any further nicotine abuse since quitting in 1992.  She has not seen a dentist in a long time, stating they told her there is nothing they can do for her.  She has a few remaining teeth that are her own.   She is agreeable to proceed with coronary bypass procedure.  Current Activity/  Functional Status: Patient is independent with mobility/ambulation, transfers, ADL's, IADL's.   Zubrod Score: At the time of surgery this patient's most appropriate activity status/level should be described as: []     0    Normal activity, no symptoms [x]     1    Restricted in physical strenuous activity but ambulatory, able to do out light work []     2    Ambulatory and capable of self care, unable to do work activities, up and about                 more than 50%  Of the time                            []     3    Only limited self care, in bed greater than 50% of waking hours []     4    Completely disabled, no self care, confined to bed or chair []     5    Moribund  Past Medical History:  Diagnosis Date  . Arthritis   . Chicken pox   . Dyslipidemia   . Genital herpes   . GERD (gastroesophageal reflux disease)   . Hepatitis B   . Hypothyroidism   . Kidney stone   . Pancreatitis     Past  Surgical History:  Procedure Laterality Date  . APPENDECTOMY  1972  . LAPAROSCOPY    . LEFT HEART CATH AND CORONARY ANGIOGRAPHY N/A 04/16/2018   Procedure: LEFT HEART CATH AND CORONARY ANGIOGRAPHY;  Surgeon: Kathleene Hazel, MD;  Location: [facility] INVASIVE CV [facility];  Service: Cardiovascular;  Laterality: N/A;  . OVARIAN CYST SURGERY    . TONSILLECTOMY      Social History   Tobacco Use  Smoking Status Former Smoker  Smokeless Tobacco Never Used    Social History   Substance and Sexual Activity  Alcohol Use No     Allergies  Allergen Reactions  . Dye Fdc Blue  [Brilliant Blue Fcf] Anaphylaxis    Dye used for CAT scans  . Contrast Media [Iodinated Diagnostic Agents]     Current Facility-Administered Medications  Medication Dose Route Frequency Provider Last Rate Last Dose  . 0.9 %  sodium chloride infusion   Intravenous Continuous Verne Carrow D, MD      . 0.9 %  sodium chloride infusion  250 mL Intravenous PRN Kathleene Hazel, MD      . acetaminophen (TYLENOL)  tablet 650 mg  650 mg Oral Q4H PRN Kathleene Hazel, MD   650 mg at 04/16/18 0919  . acyclovir (ZOVIRAX) tablet 400 mg  400 mg Oral BID Kathleene Hazel, MD   400 mg at 04/16/18 0916  . aspirin EC tablet 81 mg  81 mg Oral Daily Kathleene Hazel, MD   81 mg at 04/16/18 0914  . atorvastatin (LIPITOR) tablet 80 mg  80 mg Oral q1800 Kathleene Hazel, MD      . levothyroxine (SYNTHROID, LEVOTHROID) tablet 88 mcg  88 mcg Oral QAC breakfast Kathleene Hazel, MD   88 mcg at 04/16/18 1610  . metoprolol tartrate (LOPRESSOR) tablet 12.5 mg  12.5 mg Oral BID Kathleene Hazel, MD   12.5 mg at 04/16/18 0914  . nitroGLYCERIN (NITROSTAT) SL tablet 0.4 mg  0.4 mg Sublingual Q5 min PRN Kathleene Hazel, MD      . ondansetron [facility]) injection 4 mg  4 mg Intravenous Q6H PRN Kathleene Hazel, MD      . pantoprazole (PROTONIX) EC tablet 40 mg  40 mg Oral Daily Kathleene Hazel, MD   40 mg at 04/16/18 0914  . potassium chloride SA (K-DUR,KLOR-CON) CR tablet 40 mEq  40 mEq Oral Q4H Kathleene Hazel, MD   40 mEq at 04/16/18 0914  . sodium chloride flush (NS) 0.9 % injection 3 mL  3 mL Intravenous Q12H Verne Carrow D, MD      . sodium chloride flush (NS) 0.9 % injection 3 mL  3 mL Intravenous PRN Kathleene Hazel, MD        Medications Prior to Admission  Medication Sig Dispense Refill Last Dose  . acyclovir (ZOVIRAX) 400 MG tablet Take 1 tablet (400 mg total) by mouth 2 (two) times daily. 180 tablet 3 Taking  . atorvastatin (LIPITOR) 40 MG tablet Take 1 tablet (40 mg total) by mouth daily. 90 tablet 3 Taking  . docusate sodium (COLACE) 100 MG capsule Take 100 mg by mouth daily as needed for mild constipation.   Taking  . ibuprofen (ADVIL,MOTRIN) 200 MG tablet Take 200 mg by mouth every 6 (six) hours as needed.   Taking  . levothyroxine (SYNTHROID) 88 MCG tablet Take 1 tablet (88 mcg total) by mouth daily before breakfast. 90 tablet 3  Taking  . omeprazole (PRILOSEC) 40  MG capsule Take 1 capsule (40 mg total) by mouth daily. 90 capsule 3 Taking    Family History  Problem Relation Age of Onset  . Alcoholism Mother   . Kidney disease Mother   . Kidney Stones Mother   . Liver cancer Mother   . Alcoholism Father   . Hyperlipidemia Father   . Heart disease Father   . Heart failure Father   . Alcoholism Maternal Grandfather    Review of Systems:   ROS Constitutional: negative for chills, fevers and night sweats Ears, nose, mouth, throat, and face: negative Respiratory: negative Cardiovascular: positive for chest pressure/discomfort and exertional chest pressure/discomfort Gastrointestinal: negative for abdominal pain, diarrhea, nausea and vomiting Musculoskeletal:negative Neurological: negative for coordination problems and dizziness     Cardiac Review of Systems: Y or  [    ]= no  Chest Pain [ Y   ]  Resting SOB [ N  ] Exertional SOB  [N ]  Orthopnea [  ]   Pedal Edema [   ]    Palpitations [  ] Syncope  [  ]   Presyncope [   ]  General Review of Systems: [Y] = yes [  ]=no Constitional: recent weight change [  ]; anorexia [  ]; fatigue [  ]; nausea [  ]; night sweats [  ]; fever [  ]; or chills [  ]                                                               Dental: Last Dentist visit:   Eye : blurred vision [  ]; diplopia [   ]; vision changes [  ];  Amaurosis fugax[  ]; Resp: cough [  ];  wheezing[  ];  hemoptysis[  ]; shortness of breath[  ]; paroxysmal nocturnal dyspnea[  ]; dyspnea on exertion[  ]; or orthopnea[  ];  GI:  gallstones[  ], vomiting[  ];  dysphagia[  ]; melena[  ];  hematochezia [  ]; heartburn[  ];   Hx of  Colonoscopy[  ]; GU: kidney stones [  ]; hematuria[  ];   dysuria [  ];  nocturia[  ];  history of     obstruction [  ]; urinary frequency [  ]             Skin: rash, swelling[  ];, hair loss[  ];  peripheral edema[N  ];  or itching[  ]; Musculosketetal: myalgias[  ];  joint swelling[  ];   joint erythema[  ];  joint pain[  ];  back pain[  ];  Heme/Lymph: bruising[  ];  bleeding[  ];  anemia[  ];  Neuro: TIA[  ];  headaches[ N ];  stroke[ N ];  vertigo[  ];  seizures[  ];   paresthesias[  ];  difficulty walking[  ];  Psych:depression[  ]; anxiety[  ];  Endocrine: diabetes[  ];  thyroid dysfunction[  Y];     Physical Exam: BP 130/73   Pulse 73   Temp 98.2 F (36.8 C) (Oral)   Resp 16   Ht 5' (1.524 m)   Wt 72.5 kg   SpO2 92%   BMI 31.22 kg/m   General appearance: alert, cooperative and no distress  Head: Normocephalic, without obvious abnormality, atraumatic Neck: no adenopathy, no JVD, supple, symmetrical, trachea midline, thyroid not enlarged, symmetric, no tenderness/mass/nodules and + carotid bruit Resp: clear to auscultation bilaterally Cardio: regular rate and rhythm and systolic murmur: early systolic 3/6, blowing at 2nd right intercostal space, radiates to carotids GI: soft, non-tender; bowel sounds normal; no masses,  no organomegaly Extremities: extremities normal, atraumatic, no cyanosis or edema Neurologic: Grossly normal  Diagnostic Studies & Laboratory data:     Recent Radiology Findings:   Dg Chest 2 View  Result Date: 04/15/2018 CLINICAL DATA:  Chest pressure. EXAM: CHEST - 2 VIEW COMPARISON:  None. FINDINGS: The heart size and mediastinal contours are within normal limits. Both lungs are clear. The visualized skeletal structures are unremarkable. IMPRESSION: No active cardiopulmonary disease. Electronically Signed   By: Gerome Sam III M.D   On: 04/15/2018 14:22     I have independently reviewed the above radiologic studies and discussed with the patient   Recent Lab Findings: Lab Results  Component Value Date   WBC 4.5 04/16/2018   HGB 13.8 04/16/2018   HCT 42.7 04/16/2018   PLT 311 04/16/2018   GLUCOSE 145 (H) 04/16/2018   CHOL 215 (H) 04/16/2018   TRIG 69 04/16/2018   HDL 72 04/16/2018   LDLCALC 129 (H) 04/16/2018   ALT 15  04/15/2018   AST 21 04/15/2018   NA 137 04/16/2018   K 3.3 (L) 04/16/2018   CL 103 04/16/2018   CREATININE 0.92 04/16/2018   BUN 8 04/16/2018   CO2 25 04/16/2018   TSH 1.43 04/15/2018   HGBA1C 5.8 06/07/2017   Cath findings  Prox RCA to Mid RCA lesion is 60% stenosed.  Mid RCA to Dist RCA lesion is 20% stenosed.  Mid LM to Dist LM lesion is 40% stenosed.  Ost Cx to Prox Cx lesion is 99% stenosed.  Prox LAD lesion is 99% stenosed.  Prox LAD to Mid LAD lesion is 90% stenosed.   1. Mild to moderate eccentric distal left main stenosis.  2. Severe ostial Circumflex stenosis. 3. Severe, heavily calcified serial mid LAD stenoses 4. Moderate, heavily calcified mid RCA stenosis in the large dominant RCA  Recommendations: She will need a CT surgery consult for CABG. The LAD could be treated with orbital atherectomy and stenting but treatment of the ostial Circumflex with PCI/stenting could potentially compromise flow into the LAD as the ostial lesion is complicated by distal left main stenosis. I think CABG is the best method for revascularization. Echo is pending later today to assess LVEF and exclude valvular disease. AO Systolic Pressure 123 mmHg  AO Diastolic Pressure 68 mmHg  AO Mean 92 mmHg  LV Systolic Pressure 121 mmHg  LV Diastolic Pressure 7 mmHg  LV EDP 9 mmHg  AOp Systolic Pressure 127 mmHg  AOp Diastolic Pressure 64 mmHg  AOp Mean Pressure 91 mmHg  LVp Systolic Pressure 125 mmHg  LVp Diastolic Pressure 8 mmHg  LVp EDP Pressure 10 mmHg       I have independently reviewed the above  cath films and reviewed the findings with the  patient .  Transthoracic Echocardiography  Patient:    [name], [name] MR #:       [id number] Study Date: 04/16/2018 Gender:     F Age:        47 Height:     152.4 cm Weight:     72.5 kg BSA:        1.78 m^2 Pt. Status: Room:  2C01C   Louretta ShortenDERING     Simmons, Brittainy M  REFERRING    Sharol HarnessSimmons, Brittainy M  PERFORMING   [facility],  Inpatient  ADMITTING    Chilton Siiffany Bethel, MD  ATTENDING    Chilton Siiffany Clare, MD  SONOGRAPHER  Celene SkeenVijay Shankar, RDCS  cc:  ------------------------------------------------------------------- LV EF: 65% -   70%  ------------------------------------------------------------------- History:   PMH:  Chest Pain 786.50. Murmur 785.2.  Risk factors: Dyslipidemia.  ------------------------------------------------------------------- Study Conclusions  - Left ventricle: The cavity size was normal. There was mild focal   basal hypertrophy of the septum. Systolic function was vigorous.   The estimated ejection fraction was in the range of 65% to 70%.   Wall motion was normal; there were no regional wall motion   abnormalities. Doppler parameters are consistent with abnormal   left ventricular relaxation (grade 1 diastolic dysfunction). - Pericardium, extracardiac: A trivial pericardial effusion was   identified.  Impressions:  - Vigorous LV systolic function; mild diastolic dysfunction;   sclerotic aortic valve; mildly elevated LVOT gradient likely   related to vigorous LV function.  ------------------------------------------------------------------- Study data:  No prior study was available for comparison.  Study status:  Routine.  Procedure:  The patient reported no pain pre or post test. Transthoracic echocardiography. Image quality was adequate.  Study completion:  There were no complications. Transthoracic echocardiography.  M-mode, complete 2D, spectral Doppler, and color Doppler.  Birthdate:  Patient birthdate: August 21, 1950.  Age:  Patient is 67 yr old.  Sex:  Gender: female. BMI: 31.2 kg/m^2.  Blood pressure:     130/73  Patient status: Inpatient.  Study date:  Study date: 04/16/2018. Study time: 02:04 PM.  Location:  Bedside.  -------------------------------------------------------------------  ------------------------------------------------------------------- Left  ventricle:  The cavity size was normal. There was mild focal basal hypertrophy of the septum. Systolic function was vigorous. The estimated ejection fraction was in the range of 65% to 70%. Wall motion was normal; there were no regional wall motion abnormalities. Doppler parameters are consistent with abnormal left ventricular relaxation (grade 1 diastolic dysfunction).  ------------------------------------------------------------------- Aortic valve:   Trileaflet; mildly calcified leaflets. Mobility was not restricted.  Doppler:  Transvalvular velocity was minimally increased. There was no stenosis. There was no regurgitation. VTI ratio of LVOT to aortic valve: 0.55. Indexed valve area (VTI): 0.79 cm^2/m^2. Peak velocity ratio of LVOT to aortic valve: 0.53. Indexed valve area (Vmax): 0.76 cm^2/m^2. Mean velocity ratio of LVOT to aortic valve: 0.5. Indexed valve area (Vmean): 0.71 cm^2/m^2.    Mean gradient (S): 11 mm Hg. Peak gradient (S): 18 mm Hg.  ------------------------------------------------------------------- Aorta:  Aortic root: The aortic root was normal in size.  ------------------------------------------------------------------- Mitral valve:   Structurally normal valve.   Mobility was not restricted.  Doppler:  Transvalvular velocity was within the normal range. There was no evidence for stenosis. There was trivial regurgitation.    Peak gradient (D): 2 mm Hg.  ------------------------------------------------------------------- Left atrium:  The atrium was normal in size.  ------------------------------------------------------------------- Right ventricle:  The cavity size was normal. Systolic function was normal.  ------------------------------------------------------------------- Pulmonic valve:    Doppler:  Transvalvular velocity was within the normal range. There was no evidence for  stenosis.  ------------------------------------------------------------------- Tricuspid valve:   Structurally normal valve.    Doppler: Transvalvular velocity was within the normal range. There was trivial regurgitation.  ------------------------------------------------------------------- Pulmonary artery:   Systolic pressure was within the normal range.   ------------------------------------------------------------------- Right atrium:  The atrium was normal in size.  ------------------------------------------------------------------- Pericardium:  A trivial  pericardial effusion was identified.  ------------------------------------------------------------------- Systemic veins: Inferior vena cava: The vessel was normal in size.  ------------------------------------------------------------------- Measurements   Left ventricle                           Value          Reference  LV ID, ED, PLAX chordal          (L)     25    mm       43 - 52  LV ID, ES, PLAX chordal          (L)     19.2  mm       23 - 38  LV fx shortening, PLAX chordal   (L)     23    %        >=29  LV PW thickness, ED                      10.3  mm       ----------  IVS/LV PW ratio, ED                      1.19           <=1.3  Stroke volume, 2D                        58    ml       ----------  Stroke volume/bsa, 2D                    33    ml/m^2   ----------  LV e&', lateral                           7.51  cm/s     ----------  LV E/e&', lateral                         9.56           ----------  LV e&', medial                            6.53  cm/s     ----------  LV E/e&', medial                          11             ----------  LV e&', average                           7.02  cm/s     ----------  LV E/e&', average                         10.23          ----------    Ventricular septum                       Value          Reference  IVS thickness, ED                        12.23  mm       ----------     LVOT                                     Value          Reference  LVOT ID, S                               18    mm       ----------  LVOT area                                2.54  cm^2     ----------  LVOT peak velocity, S                    112   cm/s     ----------  LVOT mean velocity, S                    79    cm/s     ----------  LVOT VTI, S                              22.7  cm       ----------  LVOT peak gradient, S                    5     mm Hg    ----------    Aortic valve                             Value          Reference  Aortic valve peak velocity, S            210   cm/s     ----------  Aortic valve mean velocity, S            159   cm/s     ----------  Aortic valve VTI, S                      41.3  cm       ----------  Aortic mean gradient, S                  11    mm Hg    ----------  Aortic peak gradient, S                  18    mm Hg    ----------  VTI ratio, LVOT/AV                       0.55           ----------  Aortic valve area/bsa, VTI               0.79  cm^2/m^2 ----------  Velocity ratio, peak, LVOT/AV            0.53           ----------  Aortic valve area/bsa, peak              0.76  cm^2/m^2 ----------  velocity  Velocity ratio, mean, LVOT/AV            0.5            ----------  Aortic valve area/bsa, mean              0.71  cm^2/m^2 ----------  velocity    Aorta                                    Value          Reference  Aortic root ID, ED                       31    mm       ----------    Left atrium                              Value          Reference  LA ID, A-P, ES                           32    mm       ----------  LA ID/bsa, A-P                           1.8   cm/m^2   <=2.2  LA volume, S                             34.8  ml       ----------  LA volume/bsa, S                         19.5  ml/m^2   ----------  LA volume, ES, 1-p A4C                   38.8  ml       ----------  LA volume/bsa, ES, 1-p A4C               21.8  ml/m^2    ----------  LA volume, ES, 1-p A2C                   27.4  ml       ----------  LA volume/bsa, ES, 1-p A2C               15.4  ml/m^2   ----------    Mitral valve                             Value          Reference  Mitral E-wave peak velocity              71.8  cm/s     ----------  Mitral A-wave peak velocity              106   cm/s     ----------  Mitral deceleration time         (H)     275   ms       150 - 230  Mitral peak gradient, D  2     mm Hg    ----------  Mitral E/A ratio, peak                   0.7            ----------    Pulmonary arteries                       Value          Reference  PA pressure, S, DP                       30    mm Hg    <=30    Tricuspid valve                          Value          Reference  Tricuspid regurg peak velocity           258   cm/s     ----------  Tricuspid peak RV-RA gradient            27    mm Hg    ----------    Right atrium                             Value          Reference  RA ID, S-I, ES, A4C                      46    mm       34 - 49  RA area, ES, A4C                         13.9  cm^2     8.3 - 19.5  RA volume, ES, A/L                       34.1  ml       ----------  RA volume/bsa, ES, A/L                   19.1  ml/m^2   ----------    Systemic veins                           Value          Reference  Estimated CVP                            3     mm Hg    ----------    Right ventricle                          Value          Reference  RV ID, minor axis, ED, A4C base          41.6  mm       ----------  RV ID, minor axis, ED, A4C mid           30.8  mm       ----------  RV ID, major axis, ED, A4C  68.5  mm       55 - 91  TAPSE                                    21.2  mm       ----------  RV pressure, S, DP                       30    mm Hg    <=30  RV s&', lateral, S                        15.8  cm/s     ----------  Legend: (L)  and  (H)  mark values outside specified reference  range.  ------------------------------------------------------------------- Prepared and Electronically Authenticated by  Olga Millers 2019-12-03T15:30:56  Assessment / Plan:      1. CV- NSTEMI, 3V CAD- currently chest pain free, request for CABG 2. Aortic ejection murmur- Echocardiogram has been done, no significant aortic stenosis 3. H/O Alcoholism, Pancreatitis- resolved hasn't drank since 1991 4. Heavy Nicotine abuse 3-4 ppd x 27 years, quit in 1992, will need PFTs 5. Hyperlipidemia-on Lipitor  I reviewed the cath films and discussed the findings with the patient.  I agree with Dr. Clifton James that with the complex coronary disease especially involving the distal left main proximal circumflex and LAD coronary artery bypass grafting offers her the best long-term solution to her coronary artery disease.  Risks and options have been discussed with her in detail.  The goals risks and alternatives of the planned surgical procedure coronary artery bypass graft have been discussed with the patient in detail. The risks of the procedure including death, infection, stroke, myocardial infarction, bleeding, blood transfusion have all been discussed specifically.  I have quoted [name] a 40% of perioperative mortality and a complication rate as high as 2 %. The patient's questions have been answered.[name] is willing  to proceed with the planned procedure.  Delight Ovens MD      [address] [place].[address] [place] [address] Office [phone]   Beeper [phone]

## 2018-04-16 NOTE — Progress Notes (Signed)
ANTICOAGULATION CONSULT NOTE   Pharmacy Consult for heparin Indication: chest pain/ACS  Allergies  Allergen Reactions  . Dye Fdc Blue  [Brilliant Blue Fcf] Anaphylaxis    Dye used for CAT scans  . Contrast Media [Iodinated Diagnostic Agents]     Patient Measurements: Height: 5' (152.4 cm) Weight: 159 lb 13.3 oz (72.5 kg) IBW/kg (Calculated) : 45.5 Heparin Dosing Weight: 62 kg  Vital Signs: Temp: 97.7 F (36.5 C) (12/03 0805) Temp Source: Oral (12/03 0805) BP: 127/74 (12/03 0805) Pulse Rate: 75 (12/03 0013)  Labs: Recent Labs    04/15/18 1401 04/15/18 1625 04/15/18 2115 04/16/18 0229 04/16/18 0839  HGB 14.1 13.8  --  13.8  --   HCT 41.7 41.0  --  42.7  --   PLT 319.0 280  --  311  --   HEPARINUNFRC  --   --   --  0.54 0.51  CREATININE 0.73 0.77  --  0.92  --   TROPONINI  --   --  0.58* 0.34*  --     Estimated Creatinine Clearance: 51.3 mL/min (by C-G formula based on SCr of 0.92 mg/dL).   Medical History: Past Medical History:  Diagnosis Date  . Arthritis   . Chicken pox   . Dyslipidemia   . Genital herpes   . GERD (gastroesophageal reflux disease)   . Hepatitis B   . Hypothyroidism   . Kidney stone   . Pancreatitis     Medications:  Medications Prior to Admission  Medication Sig Dispense Refill Last Dose  . acyclovir (ZOVIRAX) 400 MG tablet Take 1 tablet (400 mg total) by mouth 2 (two) times daily. 180 tablet 3 Taking  . atorvastatin (LIPITOR) 40 MG tablet Take 1 tablet (40 mg total) by mouth daily. 90 tablet 3 Taking  . docusate sodium (COLACE) 100 MG capsule Take 100 mg by mouth daily as needed for mild constipation.   Taking  . ibuprofen (ADVIL,MOTRIN) 200 MG tablet Take 200 mg by mouth every 6 (six) hours as needed.   Taking  . levothyroxine (SYNTHROID) 88 MCG tablet Take 1 tablet (88 mcg total) by mouth daily before breakfast. 90 tablet 3 Taking  . omeprazole (PRILOSEC) 40 MG capsule Take 1 capsule (40 mg total) by mouth daily. 90 capsule 3  Taking    Assessment: [age] YOF who presented with chest pain. Initial troponin is elevated. Pharmacy consulted to start IV heparin for ACS. H/H and Plt wnl. Patient states she is not on any anticoagulants at home.   Heparin level at goal x 2, no issues noted. Patient now leaving for cath.   Goal of Therapy:  Heparin level 0.3-0.7 units/ml Monitor platelets by anticoagulation protocol: Yes   Plan:  Follow up plan post cath  Sheppard CoilFrank  PharmD., BCPS Clinical Pharmacist 04/16/2018 9:06 AM

## 2018-04-16 NOTE — Plan of Care (Signed)
  Problem: Health Behavior/Discharge Planning: Goal: Ability to manage health-related needs will improve Outcome: Progressing   Problem: Clinical Measurements: Goal: Ability to maintain clinical measurements within normal limits will improve Outcome: Progressing Goal: Will remain free from infection Outcome: Progressing Goal: Diagnostic test results will improve Outcome: Progressing Goal: Respiratory complications will improve Outcome: Progressing Goal: Cardiovascular complication will be avoided Outcome: Progressing   Problem: Activity: Goal: Risk for activity intolerance will decrease Outcome: Progressing   Problem: Nutrition: Goal: Adequate nutrition will be maintained Outcome: Progressing   Problem: Pain Managment: Goal: General experience of comfort will improve Outcome: Progressing   Problem: Safety: Goal: Ability to remain free from injury will improve Outcome: Progressing   Problem: Skin Integrity: Goal: Risk for impaired skin integrity will decrease Outcome: Progressing   Problem: Education: Goal: Understanding of CV disease, CV risk reduction, and recovery process will improve Outcome: Progressing   Problem: Activity: Goal: Ability to return to baseline activity level will improve Outcome: Progressing   Problem: Cardiovascular: Goal: Vascular access site(s) Level 0-1 will be maintained Outcome: Progressing   Problem: Health Behavior/Discharge Planning: Goal: Ability to safely manage health-related needs after discharge will improve Outcome: Progressing   Problem: Spiritual Needs Goal: Ability to function at adequate level Outcome: Progressing

## 2018-04-16 NOTE — Progress Notes (Signed)
  Echocardiogram 2D Echocardiogram has been performed.  Celene SkeenVijay  Lacrecia Delval 04/16/2018, 2:45 PM

## 2018-04-16 NOTE — Progress Notes (Signed)
Carotid duplex completed.  Preliminary results in CV Proc.   Blanch MediaMegan Sheanna Dail 04/16/2018 2:16 PM

## 2018-04-16 NOTE — Anesthesia Preprocedure Evaluation (Addendum)
Anesthesia Evaluation  Patient identified by MRN, date of birth, ID band Patient awake    Reviewed: Allergy & Precautions, NPO status , Patient's Chart, lab work & pertinent test results  Airway Mallampati: II       Dental  (+) Poor Dentition, Missing,    Pulmonary former smoker,    breath sounds clear to auscultation       Cardiovascular + Valvular Problems/Murmurs  Rhythm:Regular Rate:Normal     Neuro/Psych negative neurological ROS  negative psych ROS   GI/Hepatic GERD  ,(+) Hepatitis -, B  Endo/Other  Hypothyroidism   Renal/GU      Musculoskeletal  (+) Arthritis ,   Abdominal (+) + obese,   Peds  Hematology   Anesthesia Other Findings   Reproductive/Obstetrics                            Lab Results  Component Value Date   WBC 4.5 04/16/2018   HGB 13.8 04/16/2018   HCT 42.7 04/16/2018   MCV 96.8 04/16/2018   PLT 311 04/16/2018   Lab Results  Component Value Date   CREATININE 0.92 04/16/2018   BUN 12 04/16/2018   NA 137 04/16/2018   K 4.0 04/16/2018   CL 105 04/16/2018   CO2 22 04/16/2018   Lab Results  Component Value Date   INR 0.96 04/16/2018   ECho: - Left ventricle: The cavity size was normal. There was mild focal   basal hypertrophy of the septum. Systolic function was vigorous.   The estimated ejection fraction was in the range of 65% to 70%.   Wall motion was normal; there were no regional wall motion   abnormalities. Doppler parameters are consistent with abnormal   left ventricular relaxation (grade 1 diastolic dysfunction). - Pericardium, extracardiac: A trivial pericardial effusion was   identified.  Anesthesia Physical Anesthesia Plan  ASA: IV  Anesthesia Plan: General   Post-op Pain Management:    Induction: Intravenous  PONV Risk Score and Plan: 3 and Ondansetron, Midazolam and Treatment may vary due to age or medical condition  Airway  Management Planned: Oral ETT  Additional Equipment: None, Arterial line, CVP, PA Cath, TEE and Ultrasound Guidance Line Placement  Intra-op Plan:   Post-operative Plan: Post-operative intubation/ventilation  Informed Consent: I have reviewed the patients History and Physical, chart, labs and discussed the procedure including the risks, benefits and alternatives for the proposed anesthesia with the patient or authorized representative who has indicated his/her understanding and acceptance.   Dental advisory given  Plan Discussed with: CRNA  Anesthesia Plan Comments:       Anesthesia Quick Evaluation

## 2018-04-16 NOTE — Progress Notes (Signed)
Pre CABG doppler completed.  Results in CV Proc.  Blanch MediaMegan Dace Denn 04/16/2018 3:02 PM

## 2018-04-16 NOTE — H&P (View-Only) (Signed)
Progress Note  Patient Name: [name] Date of Encounter: 04/16/2018  Primary Cardiologist: Chilton Siiffany Clatsop, MD   Subjective   [name] was admitted to the ER by Dr. Duke Salviaandolph last night with non-STEMI.  She is currently on IV heparin.  Her troponins are low and flat although she does have anterior T wave inversion on her twelve-lead EKG.  She is currently asymptomatic and scheduled for cardiac catheterization later today.  Inpatient Medications    Scheduled Meds: . acyclovir  400 mg Oral BID  . aspirin EC  81 mg Oral Daily  . atorvastatin  80 mg Oral q1800  . diphenhydrAMINE  50 mg Oral Once   Or  . diphenhydrAMINE  50 mg Intravenous Once  . levothyroxine  88 mcg Oral QAC breakfast  . metoprolol tartrate  12.5 mg Oral BID  . pantoprazole  40 mg Oral Daily  . potassium chloride  40 mEq Oral Q4H  . predniSONE  50 mg Oral Q6H  . sodium chloride flush  3 mL Intravenous Q12H   Continuous Infusions: . sodium chloride    . sodium chloride 1 mL/kg/hr (04/16/18 0500)  . heparin 750 Units/hr (04/15/18 2021)   PRN Meds: sodium chloride, acetaminophen, nitroGLYCERIN, ondansetron (ZOFRAN) IV, sodium chloride flush   Vital Signs    Vitals:   04/15/18 2015 04/15/18 2155 04/16/18 0013 04/16/18 0805  BP: 100/82 (!) 148/72 (!) 116/55 127/74  Pulse: 84 80 75   Resp: (!) 21     Temp:    97.7 F (36.5 C)  TempSrc:    Oral  SpO2: 94%  96%   Weight:  72.5 kg    Height:        Intake/Output Summary (Last 24 hours) at 04/16/2018 0915 Last data filed at 04/15/2018 2200 Gross per 24 hour  Intake 46.28 ml  Output -  Net 46.28 ml   Filed Weights   04/15/18 1623 04/15/18 2155  Weight: 73.5 kg 72.5 kg    Telemetry    Sinus rhythm- Personally Reviewed  ECG    Sinus rhythm at 71 with anterior T wave inversion- Personally Reviewed  Physical Exam   GEN: No acute distress.   Neck: No JVD Cardiac: RRR, no murmurs, rubs, or gallops.  Respiratory: Clear to auscultation  bilaterally. GI: Soft, nontender, non-distended  MS: No edema; No deformity. Neuro:  Nonfocal  Psych: Normal affect   Labs    Chemistry Recent Labs  Lab 04/15/18 1401 04/15/18 1625 04/16/18 0229  NA 138 136 137  K 4.3 3.6 3.3*  CL 101 98 103  CO2 28 25 25   GLUCOSE 85 88 145*  BUN 8 <5* 8  CREATININE 0.73 0.77 0.92  CALCIUM 9.8 9.5 9.2  PROT 7.4  --   --   ALBUMIN 4.5  --   --   AST 21  --   --   ALT 15  --   --   ALKPHOS 61  --   --   BILITOT 0.5  --   --   GFRNONAA  --  >60 >60  GFRAA  --  >60 >60  ANIONGAP  --  13 9     Hematology Recent Labs  Lab 04/15/18 1401 04/15/18 1625 04/16/18 0229  WBC 6.0 6.5 4.5  RBC 4.25 4.26 4.41  HGB 14.1 13.8 13.8  HCT 41.7 41.0 42.7  MCV 98.0 96.2 96.8  MCH  --  32.4 31.3  MCHC 33.8 33.7 32.3  RDW 12.0 11.7 11.7  PLT 319.0 280 311    Cardiac Enzymes Recent Labs  Lab 04/15/18 2115 04/16/18 0229  TROPONINI 0.58* 0.34*    Recent Labs  Lab 04/15/18 1644 04/15/18 1836  TROPIPOC 0.46* 0.48*     BNPNo results for input(s): BNP, PROBNP in the last 168 hours.   DDimer No results for input(s): DDIMER in the last 168 hours.   Radiology    Dg Chest 2 View  Result Date: 04/15/2018 CLINICAL DATA:  Chest pressure. EXAM: CHEST - 2 VIEW COMPARISON:  None. FINDINGS: The heart size and mediastinal contours are within normal limits. Both lungs are clear. The visualized skeletal structures are unremarkable. IMPRESSION: No active cardiopulmonary disease. Electronically Signed   By: Gerome Sam III M.D   On: 04/15/2018 14:22    Cardiac Studies   None yet  Patient Profile     67 y.o. female without prior cardiac history on statin drugs and thyroid replacement therapy who is been having back pain and some atypical chest pain.  She was admitted to the ER by Dr. Duke Salvia yesterday evening with low-grade positive troponins and anterior T wave inversion on her twelve-lead EKG.  She is currently asymptomatic on IV  heparin.  Assessment & Plan    1: Non-STEMI- troponin II 0.58, low and flat.  Positive risk factors.  Currently on IV heparin and asymptomatic with anterior T wave inversion on her twelve-lead EKG.  Plans for diagnostic coronary angiography later today.  2: Hyperlipidemia- LDL of 129 on atorvastatin 40 mg at home.  She will most likely need to be on Repatha or another PCSK9  3: Carotid bruits- carotid Dopplers ordered  4: Hypokalemia- potassium 3.3.  Repleted  For questions or updates, please contact [facility] Please consult [email or web address] for contact info under        Signed, Nanetta Batty, MD  04/16/2018, 9:15 AM

## 2018-04-17 ENCOUNTER — Inpatient Hospital Stay (HOSPITAL_COMMUNITY): Payer: Medicare Other

## 2018-04-17 ENCOUNTER — Inpatient Hospital Stay (HOSPITAL_COMMUNITY): Payer: Medicare Other | Admitting: Certified Registered Nurse Anesthetist

## 2018-04-17 ENCOUNTER — Encounter (HOSPITAL_COMMUNITY)
Admission: EM | Disposition: A | Discharge status: Home / Self Care | Payer: Self-pay | Source: Home / Self Care | Attending: Cardiothoracic Surgery

## 2018-04-17 DIAGNOSIS — Z951 Presence of aortocoronary bypass graft: Secondary | ICD-10-CM

## 2018-04-17 DIAGNOSIS — I25110 Atherosclerotic heart disease of native coronary artery with unstable angina pectoris: Secondary | ICD-10-CM

## 2018-04-17 HISTORY — PX: CORONARY ARTERY BYPASS GRAFT: SHX141

## 2018-04-17 HISTORY — PX: ENDOVEIN HARVEST OF GREATER SAPHENOUS VEIN: SHX5059

## 2018-04-17 HISTORY — PX: TEE WITHOUT CARDIOVERSION: SHX5443

## 2018-04-17 HISTORY — DX: Presence of aortocoronary bypass graft: Z95.1

## 2018-04-17 LAB — POCT I-STAT, CHEM 8
BUN: 14 mg/dL (ref 8–23)
BUN: 10 mg/dL (ref 8–23)
BUN: 12 mg/dL (ref 8–23)
BUN: 13 mg/dL (ref 8–23)
BUN: 12 mg/dL (ref 8–23)
BUN: 12 mg/dL (ref 8–23)
BUN: 11 mg/dL (ref 8–23)
BUN: 10 mg/dL (ref 8–23)
CALCIUM ION: 1.25 mmol/L (ref 1.15–1.40)
CHLORIDE: 108 mmol/L (ref 98–111)
Calcium, Ion: 1.31 mmol/L (ref 1.15–1.40)
Calcium, Ion: 0.9 mmol/L — ABNORMAL LOW (ref 1.15–1.40)
Calcium, Ion: 1.23 mmol/L (ref 1.15–1.40)
Calcium, Ion: 1.08 mmol/L — ABNORMAL LOW (ref 1.15–1.40)
Calcium, Ion: 1.13 mmol/L — ABNORMAL LOW (ref 1.15–1.40)
Calcium, Ion: 1.25 mmol/L (ref 1.15–1.40)
Calcium, Ion: 1.13 mmol/L — ABNORMAL LOW (ref 1.15–1.40)
Chloride: 109 mmol/L (ref 98–111)
Chloride: 103 mmol/L (ref 98–111)
Chloride: 110 mmol/L (ref 98–111)
Chloride: 110 mmol/L (ref 98–111)
Chloride: 103 mmol/L (ref 98–111)
Chloride: 106 mmol/L (ref 98–111)
Chloride: 107 mmol/L (ref 98–111)
Creatinine, Ser: 0.5 mg/dL (ref 0.44–1.00)
Creatinine, Ser: 0.4 mg/dL — ABNORMAL LOW (ref 0.44–1.00)
Creatinine, Ser: 0.5 mg/dL (ref 0.44–1.00)
Creatinine, Ser: 0.6 mg/dL (ref 0.44–1.00)
Creatinine, Ser: 0.4 mg/dL — ABNORMAL LOW (ref 0.44–1.00)
Creatinine, Ser: 0.5 mg/dL (ref 0.44–1.00)
Creatinine, Ser: 0.5 mg/dL (ref 0.44–1.00)
Creatinine, Ser: 0.5 mg/dL (ref 0.44–1.00)
Glucose, Bld: 96 mg/dL (ref 70–99)
Glucose, Bld: 101 mg/dL — ABNORMAL HIGH (ref 70–99)
Glucose, Bld: 102 mg/dL — ABNORMAL HIGH (ref 70–99)
Glucose, Bld: 83 mg/dL (ref 70–99)
Glucose, Bld: 124 mg/dL — ABNORMAL HIGH (ref 70–99)
Glucose, Bld: 135 mg/dL — ABNORMAL HIGH (ref 70–99)
Glucose, Bld: 110 mg/dL — ABNORMAL HIGH (ref 70–99)
Glucose, Bld: 159 mg/dL — ABNORMAL HIGH (ref 70–99)
HCT: 33 % — ABNORMAL LOW (ref 36.0–46.0)
HCT: 26 % — ABNORMAL LOW (ref 36.0–46.0)
HCT: 31 % — ABNORMAL LOW (ref 36.0–46.0)
HCT: 26 % — ABNORMAL LOW (ref 36.0–46.0)
HCT: 32 % — ABNORMAL LOW (ref 36.0–46.0)
HEMATOCRIT: 32 % — AB (ref 36.0–46.0)
HEMATOCRIT: 24 % — AB (ref 36.0–46.0)
HEMATOCRIT: 26 % — AB (ref 36.0–46.0)
Hemoglobin: 11.2 g/dL — ABNORMAL LOW (ref 12.0–15.0)
Hemoglobin: 10.5 g/dL — ABNORMAL LOW (ref 12.0–15.0)
Hemoglobin: 10.9 g/dL — ABNORMAL LOW (ref 12.0–15.0)
Hemoglobin: 8.8 g/dL — ABNORMAL LOW (ref 12.0–15.0)
Hemoglobin: 8.2 g/dL — ABNORMAL LOW (ref 12.0–15.0)
Hemoglobin: 8.8 g/dL — ABNORMAL LOW (ref 12.0–15.0)
Hemoglobin: 8.8 g/dL — ABNORMAL LOW (ref 12.0–15.0)
Hemoglobin: 10.9 g/dL — ABNORMAL LOW (ref 12.0–15.0)
Potassium: 5.5 mmol/L — ABNORMAL HIGH (ref 3.5–5.1)
Potassium: 5.2 mmol/L — ABNORMAL HIGH (ref 3.5–5.1)
Potassium: 5.4 mmol/L — ABNORMAL HIGH (ref 3.5–5.1)
Potassium: 5.4 mmol/L — ABNORMAL HIGH (ref 3.5–5.1)
Potassium: 5.9 mmol/L — ABNORMAL HIGH (ref 3.5–5.1)
Potassium: 5.8 mmol/L — ABNORMAL HIGH (ref 3.5–5.1)
Potassium: 4.3 mmol/L (ref 3.5–5.1)
Potassium: 4.4 mmol/L (ref 3.5–5.1)
SODIUM: 134 mmol/L — AB (ref 135–145)
Sodium: 140 mmol/L (ref 135–145)
Sodium: 138 mmol/L (ref 135–145)
Sodium: 139 mmol/L (ref 135–145)
Sodium: 140 mmol/L (ref 135–145)
Sodium: 136 mmol/L (ref 135–145)
Sodium: 139 mmol/L (ref 135–145)
Sodium: 136 mmol/L (ref 135–145)
TCO2: 27 mmol/L (ref 22–32)
TCO2: 24 mmol/L (ref 22–32)
TCO2: 24 mmol/L (ref 22–32)
TCO2: 25 mmol/L (ref 22–32)
TCO2: 26 mmol/L (ref 22–32)
TCO2: 24 mmol/L (ref 22–32)
TCO2: 22 mmol/L (ref 22–32)
TCO2: 21 mmol/L — ABNORMAL LOW (ref 22–32)

## 2018-04-17 LAB — POCT I-STAT 3, ART BLOOD GAS (G3+)
ACID-BASE DEFICIT: 2 mmol/L (ref 0.0–2.0)
Acid-base deficit: 4 mmol/L — ABNORMAL HIGH (ref 0.0–2.0)
Acid-base deficit: 4 mmol/L — ABNORMAL HIGH (ref 0.0–2.0)
Bicarbonate: 24.3 mmol/L (ref 20.0–28.0)
Bicarbonate: 20.8 mmol/L (ref 20.0–28.0)
Bicarbonate: 22.2 mmol/L (ref 20.0–28.0)
Bicarbonate: 19.5 mmol/L — ABNORMAL LOW (ref 20.0–28.0)
O2 Saturation: 100 %
O2 Saturation: 100 %
O2 Saturation: 100 %
O2 Saturation: 99 %
PH ART: 7.423 (ref 7.350–7.450)
PO2 ART: 179 mmHg — AB (ref 83.0–108.0)
PO2 ART: 182 mmHg — AB (ref 83.0–108.0)
Patient temperature: 35.8
Patient temperature: 37.5
TCO2: 25 mmol/L (ref 22–32)
TCO2: 22 mmol/L (ref 22–32)
TCO2: 23 mmol/L (ref 22–32)
TCO2: 20 mmol/L — AB (ref 22–32)
pCO2 arterial: 37.9 mmHg (ref 32.0–48.0)
pCO2 arterial: 33.6 mmHg (ref 32.0–48.0)
pCO2 arterial: 32 mmHg (ref 32.0–48.0)
pCO2 arterial: 29.9 mmHg — ABNORMAL LOW (ref 32.0–48.0)
pH, Arterial: 7.415 (ref 7.350–7.450)
pH, Arterial: 7.399 (ref 7.350–7.450)
pH, Arterial: 7.445 (ref 7.350–7.450)
pO2, Arterial: 384 mmHg — ABNORMAL HIGH (ref 83.0–108.0)
pO2, Arterial: 138 mmHg — ABNORMAL HIGH (ref 83.0–108.0)

## 2018-04-17 LAB — CBC
HCT: 27.5 % — ABNORMAL LOW (ref 36.0–46.0)
HEMATOCRIT: 36.2 % (ref 36.0–46.0)
HEMATOCRIT: 30.3 % — AB (ref 36.0–46.0)
HEMOGLOBIN: 12.1 g/dL (ref 12.0–15.0)
Hemoglobin: 9.9 g/dL — ABNORMAL LOW (ref 12.0–15.0)
Hemoglobin: 9 g/dL — ABNORMAL LOW (ref 12.0–15.0)
MCH: 32.3 pg (ref 26.0–34.0)
MCH: 32.2 pg (ref 26.0–34.0)
MCH: 32.7 pg (ref 26.0–34.0)
MCHC: 33.4 g/dL (ref 30.0–36.0)
MCHC: 32.7 g/dL (ref 30.0–36.0)
MCHC: 32.7 g/dL (ref 30.0–36.0)
MCV: 96.5 fL (ref 80.0–100.0)
MCV: 98.7 fL (ref 80.0–100.0)
MCV: 100 fL (ref 80.0–100.0)
Platelets: 273 10*3/uL (ref 150–400)
Platelets: 145 10*3/uL — ABNORMAL LOW (ref 150–400)
Platelets: 138 10*3/uL — ABNORMAL LOW (ref 150–400)
RBC: 3.75 MIL/uL — ABNORMAL LOW (ref 3.87–5.11)
RBC: 3.07 MIL/uL — ABNORMAL LOW (ref 3.87–5.11)
RBC: 2.75 MIL/uL — ABNORMAL LOW (ref 3.87–5.11)
RDW: 11.9 % (ref 11.5–15.5)
RDW: 12.3 % (ref 11.5–15.5)
RDW: 12.4 % (ref 11.5–15.5)
WBC: 10.7 10*3/uL — ABNORMAL HIGH (ref 4.0–10.5)
WBC: 13.9 10*3/uL — ABNORMAL HIGH (ref 4.0–10.5)
WBC: 8.6 10*3/uL (ref 4.0–10.5)
nRBC: 0 % (ref 0.0–0.2)
nRBC: 0 % (ref 0.0–0.2)
nRBC: 0 % (ref 0.0–0.2)

## 2018-04-17 LAB — BASIC METABOLIC PANEL
Anion gap: 10 (ref 5–15)
BUN: 14 mg/dL (ref 8–23)
CO2: 21 mmol/L — AB (ref 22–32)
Calcium: 9.2 mg/dL (ref 8.9–10.3)
Chloride: 108 mmol/L (ref 98–111)
Creatinine, Ser: 0.84 mg/dL (ref 0.44–1.00)
GFR calc Af Amer: >60 mL/min (ref >60)
GFR calc non Af Amer: >60 mL/min (ref >60)
GLUCOSE: 118 mg/dL — AB (ref 70–99)
Potassium: 5 mmol/L (ref 3.5–5.1)
Sodium: 139 mmol/L (ref 135–145)

## 2018-04-17 LAB — CREATININE, SERUM
Creatinine, Ser: 0.71 mg/dL (ref 0.44–1.00)
GFR calc Af Amer: >60 mL/min (ref >60)
GFR calc non Af Amer: >60 mL/min (ref >60)

## 2018-04-17 LAB — PROTIME-INR
INR: 1.25
Prothrombin Time: 15.6 seconds — ABNORMAL HIGH (ref 11.4–15.2)

## 2018-04-17 LAB — POCT I-STAT 4, (NA,K, GLUC, HGB,HCT)
Glucose, Bld: 96 mg/dL (ref 70–99)
HCT: 27 % — ABNORMAL LOW (ref 36.0–46.0)
Hemoglobin: 9.2 g/dL — ABNORMAL LOW (ref 12.0–15.0)
Potassium: 4.4 mmol/L (ref 3.5–5.1)
Sodium: 140 mmol/L (ref 135–145)

## 2018-04-17 LAB — APTT: aPTT: 27 seconds (ref 24–36)

## 2018-04-17 LAB — HEMOGLOBIN AND HEMATOCRIT, BLOOD
HCT: 25.4 % — ABNORMAL LOW (ref 36.0–46.0)
Hemoglobin: 8.4 g/dL — ABNORMAL LOW (ref 12.0–15.0)

## 2018-04-17 LAB — HEPARIN LEVEL (UNFRACTIONATED): Heparin Unfractionated: 0.48 IU/mL (ref 0.30–0.70)

## 2018-04-17 LAB — MAGNESIUM: Magnesium: 3.4 mg/dL — ABNORMAL HIGH (ref 1.7–2.4)

## 2018-04-17 LAB — PLATELET COUNT: Platelets: 178 10*3/uL (ref 150–400)

## 2018-04-17 SURGERY — CORONARY ARTERY BYPASS GRAFTING (CABG)
Anesthesia: General | Site: Leg Lower | Laterality: Right

## 2018-04-17 MED ORDER — INSULIN ASPART 100 UNIT/ML ~~LOC~~ SOLN
0.0000 [IU] | SUBCUTANEOUS | Status: DC
  Administered 2018-04-17 – 2018-04-18 (×2): 2 [IU] via SUBCUTANEOUS

## 2018-04-17 MED ORDER — LACTATED RINGERS IV SOLN: INTRAVENOUS | Status: DC

## 2018-04-17 MED ORDER — PANTOPRAZOLE SODIUM 40 MG PO TBEC
40.0000 mg | DELAYED_RELEASE_TABLET | Freq: Every day | ORAL | Status: DC
  Administered 2018-04-19 – 2018-04-22 (×4): 40 mg via ORAL
  Filled 2018-04-17 (×4): qty 1

## 2018-04-17 MED ORDER — SODIUM CHLORIDE 0.9 % IV SOLN: INTRAVENOUS | Status: DC

## 2018-04-17 MED ORDER — DEXMEDETOMIDINE HCL IN NACL 200 MCG/50ML IV SOLN: 0.0000 ug/kg/h | INTRAVENOUS | Status: DC

## 2018-04-17 MED ORDER — ROCURONIUM BROMIDE 50 MG/5ML IV SOSY
PREFILLED_SYRINGE | INTRAVENOUS | Status: AC
  Filled 2018-04-17: qty 5

## 2018-04-17 MED ORDER — SODIUM CHLORIDE 0.45 % IV SOLN
INTRAVENOUS | Status: DC | PRN
  Administered 2018-04-17: 15:00:00 via INTRAVENOUS

## 2018-04-17 MED ORDER — FENTANYL CITRATE (PF) 250 MCG/5ML IJ SOLN
INTRAMUSCULAR | Status: AC
  Filled 2018-04-17: qty 20

## 2018-04-17 MED ORDER — ACETAMINOPHEN 650 MG RE SUPP
650.0000 mg | Freq: Once | RECTAL | Status: AC
  Administered 2018-04-17: 650 mg via RECTAL

## 2018-04-17 MED ORDER — PROTAMINE SULFATE 10 MG/ML IV SOLN
INTRAVENOUS | Status: DC | PRN
  Administered 2018-04-17 (×2): 130 mg via INTRAVENOUS

## 2018-04-17 MED ORDER — CHLORHEXIDINE GLUCONATE 0.12% ORAL RINSE (MEDLINE KIT)
15.0000 mL | Freq: Two times a day (BID) | OROMUCOSAL | Status: DC
  Administered 2018-04-17: 15 mL via OROMUCOSAL

## 2018-04-17 MED ORDER — LACTATED RINGERS IV SOLN
INTRAVENOUS | Status: DC
  Administered 2018-04-18: 20 mL/h via INTRAVENOUS

## 2018-04-17 MED ORDER — EPHEDRINE SULFATE-NACL 50-0.9 MG/10ML-% IV SOSY
PREFILLED_SYRINGE | INTRAVENOUS | Status: DC | PRN
  Administered 2018-04-17 (×2): 10 mg via INTRAVENOUS

## 2018-04-17 MED ORDER — FAMOTIDINE IN NACL 20-0.9 MG/50ML-% IV SOLN
20.0000 mg | Freq: Two times a day (BID) | INTRAVENOUS | Status: AC
  Administered 2018-04-17: 20 mg via INTRAVENOUS

## 2018-04-17 MED ORDER — ORAL CARE MOUTH RINSE
15.0000 mL | OROMUCOSAL | Status: DC
  Administered 2018-04-17 – 2018-04-18 (×5): 15 mL via OROMUCOSAL

## 2018-04-17 MED ORDER — NITROGLYCERIN IN D5W 200-5 MCG/ML-% IV SOLN: 0.0000 ug/min | INTRAVENOUS | Status: DC

## 2018-04-17 MED ORDER — INSULIN REGULAR(HUMAN) IN NACL 100-0.9 UT/100ML-% IV SOLN: INTRAVENOUS | Status: DC

## 2018-04-17 MED ORDER — SODIUM CHLORIDE 0.9 % IV SOLN
INTRAVENOUS | Status: DC | PRN
  Administered 2018-04-17: 750 mg via INTRAVENOUS

## 2018-04-17 MED ORDER — HEPARIN SODIUM (PORCINE) 1000 UNIT/ML IJ SOLN
INTRAMUSCULAR | Status: DC | PRN
  Administered 2018-04-17: 26000 [IU] via INTRAVENOUS

## 2018-04-17 MED ORDER — LACTATED RINGERS IV SOLN
INTRAVENOUS | Status: DC | PRN
  Administered 2018-04-17: 08:00:00 via INTRAVENOUS

## 2018-04-17 MED ORDER — SODIUM CHLORIDE 0.9% FLUSH: 3.0000 mL | Freq: Two times a day (BID) | INTRAVENOUS | Status: DC

## 2018-04-17 MED ORDER — DIPHENHYDRAMINE HCL 50 MG/ML IJ SOLN
INTRAMUSCULAR | Status: AC
  Filled 2018-04-17: qty 1

## 2018-04-17 MED ORDER — METOPROLOL TARTRATE 5 MG/5ML IV SOLN: 2.5000 mg | INTRAVENOUS | Status: DC | PRN

## 2018-04-17 MED ORDER — OXYCODONE HCL 5 MG PO TABS: 5.0000 mg | ORAL_TABLET | ORAL | Status: DC | PRN

## 2018-04-17 MED ORDER — 0.9 % SODIUM CHLORIDE (POUR BTL) OPTIME
TOPICAL | Status: DC | PRN
  Administered 2018-04-17: 5000 mL

## 2018-04-17 MED ORDER — ROCURONIUM BROMIDE 50 MG/5ML IV SOSY
PREFILLED_SYRINGE | INTRAVENOUS | Status: DC | PRN
  Administered 2018-04-17 (×5): 50 mg via INTRAVENOUS

## 2018-04-17 MED ORDER — ASPIRIN EC 325 MG PO TBEC
325.0000 mg | DELAYED_RELEASE_TABLET | Freq: Every day | ORAL | Status: DC
  Administered 2018-04-18 – 2018-04-22 (×5): 325 mg via ORAL
  Filled 2018-04-17 (×5): qty 1

## 2018-04-17 MED ORDER — ACETAMINOPHEN 160 MG/5ML PO SOLN: 1000.0000 mg | Freq: Four times a day (QID) | ORAL | Status: AC

## 2018-04-17 MED ORDER — SODIUM CHLORIDE 0.9% FLUSH: 10.0000 mL | INTRAVENOUS | Status: DC | PRN

## 2018-04-17 MED ORDER — METOPROLOL TARTRATE 12.5 MG HALF TABLET
12.5000 mg | ORAL_TABLET | Freq: Two times a day (BID) | ORAL | Status: DC
  Administered 2018-04-18 – 2018-04-22 (×6): 12.5 mg via ORAL
  Filled 2018-04-17 (×9): qty 1

## 2018-04-17 MED ORDER — PHENYLEPHRINE 40 MCG/ML (10ML) SYRINGE FOR IV PUSH (FOR BLOOD PRESSURE SUPPORT)
PREFILLED_SYRINGE | INTRAVENOUS | Status: AC
  Filled 2018-04-17: qty 10

## 2018-04-17 MED ORDER — MIDAZOLAM HCL (PF) 10 MG/2ML IJ SOLN
INTRAMUSCULAR | Status: AC
  Filled 2018-04-17: qty 2

## 2018-04-17 MED ORDER — PHENYLEPHRINE HCL-NACL 20-0.9 MG/250ML-% IV SOLN
0.0000 ug/min | INTRAVENOUS | Status: DC
  Administered 2018-04-18: 50 ug/min via INTRAVENOUS
  Administered 2018-04-18: 30 ug/min via INTRAVENOUS
  Administered 2018-04-18: 40 ug/min via INTRAVENOUS
  Administered 2018-04-19: 10 ug/min via INTRAVENOUS
  Administered 2018-04-19: 40 ug/min via INTRAVENOUS
  Filled 2018-04-17 (×5): qty 250

## 2018-04-17 MED ORDER — SODIUM CHLORIDE 0.9% FLUSH
10.0000 mL | Freq: Two times a day (BID) | INTRAVENOUS | Status: DC
  Administered 2018-04-18 – 2018-04-21 (×5): 10 mL

## 2018-04-17 MED ORDER — SODIUM CHLORIDE 0.9% FLUSH: 3.0000 mL | INTRAVENOUS | Status: DC | PRN

## 2018-04-17 MED ORDER — CHLORHEXIDINE GLUCONATE CLOTH 2 % EX PADS
6.0000 | MEDICATED_PAD | Freq: Every day | CUTANEOUS | Status: DC
  Administered 2018-04-17 – 2018-04-21 (×5): 6 via TOPICAL

## 2018-04-17 MED ORDER — ARTIFICIAL TEARS OPHTHALMIC OINT
TOPICAL_OINTMENT | OPHTHALMIC | Status: AC
  Filled 2018-04-17: qty 3.5

## 2018-04-17 MED ORDER — ONDANSETRON HCL 4 MG/2ML IJ SOLN
4.0000 mg | Freq: Four times a day (QID) | INTRAMUSCULAR | Status: DC | PRN
  Administered 2018-04-17 – 2018-04-19 (×2): 4 mg via INTRAVENOUS
  Filled 2018-04-17 (×2): qty 2

## 2018-04-17 MED ORDER — SODIUM CHLORIDE 0.9 % IV SOLN: 250.0000 mL | INTRAVENOUS | Status: DC

## 2018-04-17 MED ORDER — ALBUMIN HUMAN 5 % IV SOLN
INTRAVENOUS | Status: DC | PRN
  Administered 2018-04-17 (×2): via INTRAVENOUS

## 2018-04-17 MED ORDER — HEMOSTATIC AGENTS (NO CHARGE) OPTIME
TOPICAL | Status: DC | PRN
  Administered 2018-04-17 (×2): 1 via TOPICAL
  Administered 2018-04-17: 3 via TOPICAL

## 2018-04-17 MED ORDER — PROPOFOL 10 MG/ML IV BOLUS
INTRAVENOUS | Status: DC | PRN
  Administered 2018-04-17: 40 mg via INTRAVENOUS
  Administered 2018-04-17: 70 mg via INTRAVENOUS

## 2018-04-17 MED ORDER — SODIUM CHLORIDE 0.9 % IV SOLN
1.5000 g | Freq: Two times a day (BID) | INTRAVENOUS | Status: AC
  Administered 2018-04-17 – 2018-04-19 (×4): 1.5 g via INTRAVENOUS
  Filled 2018-04-17 (×4): qty 1.5

## 2018-04-17 MED ORDER — HEPARIN SODIUM (PORCINE) 1000 UNIT/ML IJ SOLN
INTRAMUSCULAR | Status: AC
  Filled 2018-04-17: qty 1

## 2018-04-17 MED ORDER — DIPHENHYDRAMINE HCL 50 MG/ML IJ SOLN
INTRAMUSCULAR | Status: DC | PRN
  Administered 2018-04-17: 6.25 mg via INTRAVENOUS

## 2018-04-17 MED ORDER — FENTANYL CITRATE (PF) 250 MCG/5ML IJ SOLN
INTRAMUSCULAR | Status: DC | PRN
  Administered 2018-04-17 (×2): 150 ug via INTRAVENOUS
  Administered 2018-04-17: 50 ug via INTRAVENOUS
  Administered 2018-04-17 (×2): 100 ug via INTRAVENOUS
  Administered 2018-04-17 (×2): 50 ug via INTRAVENOUS
  Administered 2018-04-17: 100 ug via INTRAVENOUS
  Administered 2018-04-17: 250 ug via INTRAVENOUS

## 2018-04-17 MED ORDER — TRAMADOL HCL 50 MG PO TABS
50.0000 mg | ORAL_TABLET | ORAL | Status: DC | PRN
  Administered 2018-04-18 – 2018-04-19 (×5): 50 mg via ORAL
  Filled 2018-04-17 (×5): qty 1

## 2018-04-17 MED ORDER — VANCOMYCIN HCL IN DEXTROSE 1-5 GM/200ML-% IV SOLN
1000.0000 mg | Freq: Once | INTRAVENOUS | Status: AC
  Administered 2018-04-17: 1000 mg via INTRAVENOUS
  Filled 2018-04-17: qty 200

## 2018-04-17 MED ORDER — PROTAMINE SULFATE 10 MG/ML IV SOLN
INTRAVENOUS | Status: AC
  Filled 2018-04-17: qty 25

## 2018-04-17 MED ORDER — MIDAZOLAM HCL 5 MG/5ML IJ SOLN
INTRAMUSCULAR | Status: DC | PRN
  Administered 2018-04-17 (×3): 1 mg via INTRAVENOUS
  Administered 2018-04-17 (×2): 2 mg via INTRAVENOUS

## 2018-04-17 MED ORDER — MAGNESIUM SULFATE 4 GM/100ML IV SOLN
4.0000 g | Freq: Once | INTRAVENOUS | Status: AC
  Administered 2018-04-17: 4 g via INTRAVENOUS
  Filled 2018-04-17: qty 100

## 2018-04-17 MED ORDER — SODIUM CHLORIDE 0.9 % IV SOLN
INTRAVENOUS | Status: DC | PRN
  Administered 2018-04-17: 13:00:00 via INTRAVENOUS

## 2018-04-17 MED ORDER — CALCIUM CHLORIDE 10 % IV SOLN
INTRAVENOUS | Status: DC | PRN
  Administered 2018-04-17: 100 mg via INTRAVENOUS
  Administered 2018-04-17: 200 mg via INTRAVENOUS

## 2018-04-17 MED ORDER — MORPHINE SULFATE (PF) 2 MG/ML IV SOLN
1.0000 mg | INTRAVENOUS | Status: DC | PRN
  Administered 2018-04-17 – 2018-04-18 (×3): 2 mg via INTRAVENOUS
  Filled 2018-04-17 (×3): qty 1

## 2018-04-17 MED ORDER — ACETAMINOPHEN 160 MG/5ML PO SOLN: 650.0000 mg | Freq: Once | ORAL | Status: AC

## 2018-04-17 MED ORDER — ACETAMINOPHEN 500 MG PO TABS
1000.0000 mg | ORAL_TABLET | Freq: Four times a day (QID) | ORAL | Status: AC
  Administered 2018-04-17 – 2018-04-22 (×20): 1000 mg via ORAL
  Filled 2018-04-17 (×21): qty 2

## 2018-04-17 MED ORDER — CALCIUM CHLORIDE 10 % IV SOLN
INTRAVENOUS | Status: AC
  Filled 2018-04-17: qty 10

## 2018-04-17 MED ORDER — MIDAZOLAM HCL 2 MG/2ML IJ SOLN: 2.0000 mg | INTRAMUSCULAR | Status: DC | PRN

## 2018-04-17 MED ORDER — DOCUSATE SODIUM 100 MG PO CAPS
200.0000 mg | ORAL_CAPSULE | Freq: Every day | ORAL | Status: DC
  Administered 2018-04-18 – 2018-04-22 (×5): 200 mg via ORAL
  Filled 2018-04-17 (×5): qty 2

## 2018-04-17 MED ORDER — ALBUMIN HUMAN 5 % IV SOLN
250.0000 mL | INTRAVENOUS | Status: AC | PRN
  Administered 2018-04-17 (×3): 12.5 g via INTRAVENOUS
  Filled 2018-04-17: qty 250

## 2018-04-17 MED ORDER — PROPOFOL 10 MG/ML IV BOLUS
INTRAVENOUS | Status: AC
  Filled 2018-04-17: qty 20

## 2018-04-17 MED ORDER — ARTIFICIAL TEARS OPHTHALMIC OINT
TOPICAL_OINTMENT | OPHTHALMIC | Status: DC | PRN
  Administered 2018-04-17: 1 via OPHTHALMIC

## 2018-04-17 MED ORDER — LACTATED RINGERS IV SOLN
500.0000 mL | Freq: Once | INTRAVENOUS | Status: AC | PRN
  Administered 2018-04-18: 500 mL via INTRAVENOUS

## 2018-04-17 MED ORDER — PHENYLEPHRINE 40 MCG/ML (10ML) SYRINGE FOR IV PUSH (FOR BLOOD PRESSURE SUPPORT)
PREFILLED_SYRINGE | INTRAVENOUS | Status: DC | PRN
  Administered 2018-04-17: 80 ug via INTRAVENOUS
  Administered 2018-04-17: 120 ug via INTRAVENOUS
  Administered 2018-04-17: 60 ug via INTRAVENOUS
  Administered 2018-04-17 (×2): 40 ug via INTRAVENOUS
  Administered 2018-04-17: 120 ug via INTRAVENOUS
  Administered 2018-04-17: 80 ug via INTRAVENOUS

## 2018-04-17 MED ORDER — CHLORHEXIDINE GLUCONATE 0.12 % MT SOLN
15.0000 mL | OROMUCOSAL | Status: AC
  Administered 2018-04-17: 15 mL via OROMUCOSAL

## 2018-04-17 MED ORDER — ONDANSETRON HCL 4 MG/2ML IJ SOLN
INTRAMUSCULAR | Status: DC | PRN
  Administered 2018-04-17: 4 mg via INTRAVENOUS

## 2018-04-17 MED ORDER — ONDANSETRON HCL 4 MG/2ML IJ SOLN
INTRAMUSCULAR | Status: AC
  Filled 2018-04-17: qty 2

## 2018-04-17 MED ORDER — INSULIN REGULAR BOLUS VIA INFUSION
0.0000 [IU] | Freq: Three times a day (TID) | INTRAVENOUS | Status: DC
  Filled 2018-04-17: qty 10

## 2018-04-17 MED ORDER — EPHEDRINE 5 MG/ML INJ
INTRAVENOUS | Status: AC
  Filled 2018-04-17: qty 10

## 2018-04-17 MED ORDER — METOPROLOL TARTRATE 25 MG/10 ML ORAL SUSPENSION: 12.5000 mg | Freq: Two times a day (BID) | ORAL | Status: DC

## 2018-04-17 MED ORDER — ASPIRIN 81 MG PO CHEW: 324.0000 mg | CHEWABLE_TABLET | Freq: Every day | ORAL | Status: DC

## 2018-04-17 MED ORDER — LACTATED RINGERS IV SOLN
INTRAVENOUS | Status: DC | PRN
  Administered 2018-04-17: 09:00:00 via INTRAVENOUS

## 2018-04-17 MED ORDER — POTASSIUM CHLORIDE 10 MEQ/50ML IV SOLN: 10.0000 meq | INTRAVENOUS | Status: AC

## 2018-04-17 SURGICAL SUPPLY — 71 items
BAG DECANTER FOR FLEXI CONT (MISCELLANEOUS) ×4 IMPLANT
BANDAGE ACE 4X5 VEL STRL LF (GAUZE/BANDAGES/DRESSINGS) ×4 IMPLANT
BANDAGE ACE 6X5 VEL STRL LF (GAUZE/BANDAGES/DRESSINGS) ×4 IMPLANT
BANDAGE ELASTIC 4 VELCRO ST LF (GAUZE/BANDAGES/DRESSINGS) ×4 IMPLANT
BANDAGE ELASTIC 6 VELCRO ST LF (GAUZE/BANDAGES/DRESSINGS) ×4 IMPLANT
BLADE STERNUM SYSTEM 6 (BLADE) ×4 IMPLANT
BNDG GAUZE ELAST 4 BULKY (GAUZE/BANDAGES/DRESSINGS) ×4 IMPLANT
CANISTER SUCT 3000ML PPV (MISCELLANEOUS) ×4 IMPLANT
CANNULA VEN 2 STAGE (MISCELLANEOUS) ×4 IMPLANT
CATH CPB KIT GERHARDT (MISCELLANEOUS) ×4 IMPLANT
CATH THORACIC 28FR (CATHETERS) ×4 IMPLANT
COVER WAND RF STERILE (DRAPES) ×4 IMPLANT
CRADLE DONUT ADULT HEAD (MISCELLANEOUS) ×4 IMPLANT
DERMABOND ADVANCED (GAUZE/BANDAGES/DRESSINGS) ×1
DERMABOND ADVANCED .7 DNX12 (GAUZE/BANDAGES/DRESSINGS) ×3 IMPLANT
DRAIN CHANNEL 28F RND 3/8 FF (WOUND CARE) ×4 IMPLANT
DRAPE CARDIOVASCULAR INCISE (DRAPES) ×1
DRAPE SLUSH/WARMER DISC (DRAPES) ×4 IMPLANT
DRAPE SRG 135X102X78XABS (DRAPES) ×3 IMPLANT
DRSG AQUACEL AG ADV 3.5X14 (GAUZE/BANDAGES/DRESSINGS) ×4 IMPLANT
ELECT BLADE 4.0 EZ CLEAN MEGAD (MISCELLANEOUS) ×4
ELECT REM PT RETURN 9FT ADLT (ELECTROSURGICAL) ×8
ELECTRODE BLDE 4.0 EZ CLN MEGD (MISCELLANEOUS) ×3 IMPLANT
ELECTRODE REM PT RTRN 9FT ADLT (ELECTROSURGICAL) ×6 IMPLANT
FELT TEFLON 1X6 (MISCELLANEOUS) ×8 IMPLANT
GAUZE SPONGE 4X4 12PLY STRL (GAUZE/BANDAGES/DRESSINGS) ×8 IMPLANT
GAUZE SPONGE 4X4 12PLY STRL LF (GAUZE/BANDAGES/DRESSINGS) ×8 IMPLANT
GLOVE BIO SURGEON STRL SZ 6.5 (GLOVE) ×12 IMPLANT
GOWN STRL REUS W/ TWL LRG LVL3 (GOWN DISPOSABLE) ×12 IMPLANT
GOWN STRL REUS W/TWL LRG LVL3 (GOWN DISPOSABLE) ×4
HEMOSTAT POWDER KIT SURGIFOAM (HEMOSTASIS) ×12 IMPLANT
HEMOSTAT POWDER SURGIFOAM 1G (HEMOSTASIS) ×12 IMPLANT
HEMOSTAT SURGICEL 2X14 (HEMOSTASIS) ×4 IMPLANT
KIT BASIN OR (CUSTOM PROCEDURE TRAY) ×4 IMPLANT
KIT CATH SUCT 8FR (CATHETERS) ×4 IMPLANT
KIT SUCTION CATH 14FR (SUCTIONS) ×8 IMPLANT
KIT TURNOVER KIT B (KITS) ×4 IMPLANT
KIT VASOVIEW HEMOPRO 2 VH 4000 (KITS) ×4 IMPLANT
LEAD PACING MYOCARDI (MISCELLANEOUS) ×4 IMPLANT
MARKER GRAFT CORONARY BYPASS (MISCELLANEOUS) ×12 IMPLANT
NS IRRIG 1000ML POUR BTL (IV SOLUTION) ×20 IMPLANT
PACK E OPEN HEART (SUTURE) ×4 IMPLANT
PACK OPEN HEART (CUSTOM PROCEDURE TRAY) ×4 IMPLANT
PAD ARMBOARD 7.5X6 YLW CONV (MISCELLANEOUS) ×8 IMPLANT
PAD ELECT DEFIB RADIOL ZOLL (MISCELLANEOUS) ×4 IMPLANT
PENCIL BUTTON HOLSTER BLD 10FT (ELECTRODE) ×4 IMPLANT
SET CARDIOPLEGIA MPS 5001102 (MISCELLANEOUS) ×4 IMPLANT
SURGIFLO W/THROMBIN 8M KIT (HEMOSTASIS) ×4 IMPLANT
SUT BONE WAX W31G (SUTURE) ×4 IMPLANT
SUT MNCRL AB 4-0 PS2 18 (SUTURE) ×4 IMPLANT
SUT PROLENE 3 0 SH1 36 (SUTURE) ×4 IMPLANT
SUT PROLENE 4 0 TF (SUTURE) ×8 IMPLANT
SUT PROLENE 5 0 C 1 36 (SUTURE) ×4 IMPLANT
SUT PROLENE 6 0 C 1 30 (SUTURE) ×32 IMPLANT
SUT PROLENE 6 0 CC (SUTURE) ×8 IMPLANT
SUT PROLENE 7 0 BV1 MDA (SUTURE) ×8 IMPLANT
SUT PROLENE 7.0 RB 3 (SUTURE) ×12 IMPLANT
SUT PROLENE 8 0 BV175 6 (SUTURE) ×16 IMPLANT
SUT STEEL 6MS V (SUTURE) ×4 IMPLANT
SUT STEEL SZ 6 DBL 3X14 BALL (SUTURE) ×4 IMPLANT
SUT VIC AB 1 CTX 18 (SUTURE) ×8 IMPLANT
SUT VIC AB 2-0 CT1 27 (SUTURE) ×1
SUT VIC AB 2-0 CT1 TAPERPNT 27 (SUTURE) ×3 IMPLANT
SYSTEM SAHARA CHEST DRAIN ATS (WOUND CARE) ×4 IMPLANT
TAPE CLOTH SURG 4X10 WHT LF (GAUZE/BANDAGES/DRESSINGS) ×4 IMPLANT
TOWEL GREEN STERILE (TOWEL DISPOSABLE) ×4 IMPLANT
TOWEL GREEN STERILE FF (TOWEL DISPOSABLE) ×4 IMPLANT
TRAY FOLEY SLVR 16FR TEMP STAT (SET/KITS/TRAYS/PACK) ×4 IMPLANT
TUBING INSUFFLATION (TUBING) ×4 IMPLANT
UNDERPAD 30X30 (UNDERPADS AND DIAPERS) ×4 IMPLANT
WATER STERILE IRR 1000ML POUR (IV SOLUTION) ×8 IMPLANT

## 2018-04-17 NOTE — Progress Notes (Signed)
Pre Procedure note for inpatients:      [address]       [place] [id number]             [phone]       Kimberly LutesCarolyn Barnes has been scheduled for Procedure(s): CORONARY ARTERY BYPASS GRAFTING (CABG) (N/A) TRANSESOPHAGEAL ECHOCARDIOGRAM (TEE) (N/A) today. The various methods of treatment have been discussed with the patient. After consideration of the risks, benefits and treatment options the patient has consented to the planned procedure.   The patient has been seen and labs reviewed. There are no changes in the patient's condition to prevent proceeding with the planned procedure today.  Recent labs:  Lab Results  Component Value Date   WBC 10.7 (H) 04/17/2018   HGB 12.1 04/17/2018   HCT 36.2 04/17/2018   PLT 273 04/17/2018   GLUCOSE 118 (H) 04/17/2018   CHOL 215 (H) 04/16/2018   TRIG 69 04/16/2018   HDL 72 04/16/2018   LDLCALC 129 (H) 04/16/2018   ALT 17 04/16/2018   AST 29 04/16/2018   NA 139 04/17/2018   K 5.0 04/17/2018   CL 108 04/17/2018   CREATININE 0.84 04/17/2018   BUN 14 04/17/2018   CO2 21 (L) 04/17/2018   TSH 1.43 04/15/2018   INR 0.96 04/16/2018   HGBA1C 5.3 04/16/2018    Kimberly OvensEdward B Emin Foree, MD 04/17/2018 7:50 AM

## 2018-04-17 NOTE — Progress Notes (Signed)
Patient ID: [name], female   DOB: 09-Jan-1951, 67 y.o.   MRN: [id number] EVENING ROUNDS NOTE :     [address]       [place] [id number]             [phone]                 Day of Surgery Procedure(s) (LRB): CORONARY ARTERY BYPASS GRAFTING (CABG) TIMES USING LEFT INTERNAL MAMMARY ARTERY TO LAD AND SAPHENOUS VEIN TO DIAG 1, OM1, AND RCA (N/A) TRANSESOPHAGEAL ECHOCARDIOGRAM (TEE) (N/A) ENDOVEIN HARVEST OF GREATER SAPHENOUS VEIN RIGHT THIGH AND CALF (Right)  Total Length of Stay:  LOS: 2 days  BP (!) 72/54   Pulse 89   Temp (!) 97.2 F (36.2 C)   Resp 12   Ht 5' (1.524 m)   Wt 72.5 kg   SpO2 100%   BMI 31.22 kg/m   .Intake/Output      12/03 0701 - 12/04 0700 12/04 0701 - 12/05 0700   P.O. 360    I.V. (mL/kg) 97.4 (1.3) 3514.9 (48.5)   Blood  320   IV Piggyback  1190   Total Intake(mL/kg) 457.4 (6.3) 5024.9 (69.3)   Urine (mL/kg/hr) 600 (0.3) 2470 (2.9)   Blood  550   Chest Tube  100   Total Output 600 3120   Net -142.6 +1904.9        Urine Occurrence 3 x      . sodium chloride 20 mL/hr at 04/17/18 1500  . [START ON 04/18/2018] sodium chloride    . sodium chloride    . albumin human 12.5 g (04/17/18 1817)  . cefUROXime (ZINACEF)  IV 1.5 g (04/17/18 1639)  . dexmedetomidine (PRECEDEX) IV infusion    . famotidine (PEPCID) IV 20 mg (04/17/18 1500)  . insulin    . lactated ringers    . lactated ringers    . lactated ringers    . magnesium sulfate 4 g (04/17/18 1616)  . nitroGLYCERIN    . phenylephrine (NEO-SYNEPHRINE) Adult infusion    . vancomycin       Lab Results  Component Value Date   WBC 13.9 (H) 04/17/2018   HGB 9.9 (L) 04/17/2018   HCT 30.3 (L) 04/17/2018   PLT 145 (L) 04/17/2018   GLUCOSE 110 (H) 04/17/2018   CHOL 215 (H) 04/16/2018   TRIG 69 04/16/2018   HDL 72 04/16/2018   LDLCALC 129 (H) 04/16/2018   ALT 17 04/16/2018   AST 29 04/16/2018   NA 139 04/17/2018   K 4.3 04/17/2018   CL 108 04/17/2018   CREATININE  0.50 04/17/2018   BUN 11 04/17/2018   CO2 21 (L) 04/17/2018   TSH 1.43 04/15/2018   INR 1.25 04/17/2018   HGBA1C 5.3 04/16/2018   Waking up, moves all extremities Not bleeding Still on vent , wean when more awake 1.94 ci   Delight OvensEdward B Maryiah Olvey MD  Beeper [phone] Office [phone] 04/17/2018 6:35 PM

## 2018-04-17 NOTE — Progress Notes (Signed)
I responded to a [place] to provide Advance Directive information for the patient. I visited the patient's room with the nurse present. The patient was unavailable and unable to receive AD information at the time. The nurse suggested a visit at a later time, perhaps tomorrow would be better. The nurse will submit another consultation request when the patient is available.    04/17/18 1600  Clinical Encounter Type  Visited With Patient;Health care provider  Visit Type Spiritual support  Referral From Nurse  Consult/Referral To Chaplain  Spiritual Encounters  Spiritual Needs Literature;Prayer    Chaplain Dr Melvyn NovasMichael Niza Soderholm

## 2018-04-17 NOTE — Progress Notes (Signed)
RT started Heart protocol wean. Pt settings 10 PS above PEEP with 5 Peep FIO2 of 40%. Pt was able to lift head off of pillow and hold, squeeze hands, move feet upon request. Pt tolerating wean thus far. RT will continue to monitor 

## 2018-04-17 NOTE — Transfer of Care (Signed)
Immediate Anesthesia Transfer of Care Note  Patient: [name]  Procedure(s) Performed: CORONARY ARTERY BYPASS GRAFTING (CABG) TIMES USING LEFT INTERNAL MAMMARY ARTERY TO LAD AND SAPHENOUS VEIN TO DIAG 1, OM1, AND RCA (N/A Chest) TRANSESOPHAGEAL ECHOCARDIOGRAM (TEE) (N/A Esophagus) ENDOVEIN HARVEST OF GREATER SAPHENOUS VEIN RIGHT THIGH AND CALF (Right Leg Lower)  Patient Location: ICU  Anesthesia Type:General  Level of Consciousness: Patient remains intubated per anesthesia plan  Airway & Oxygen Therapy: Patient remains intubated per anesthesia plan and Patient placed on Ventilator (see vital sign flow sheet for setting)  Post-op Assessment: Report given to RN  Post vital signs: Reviewed and stable  Last Vitals:  Vitals Value Taken Time  BP 121/69 04/17/2018  3:05 PM  Temp 35.7 C 04/17/2018  3:05 PM  Pulse 89 04/17/2018  3:05 PM  Resp 12 04/17/2018  3:05 PM  SpO2 100 % 04/17/2018  3:05 PM  Vitals shown include unvalidated device data.  Last Pain:  Vitals:   04/17/18 0343  TempSrc: Oral  PainSc: 0-No pain      Patients Stated Pain Goal: 0 (04/16/18 1947)  Complications: No apparent anesthesia complications

## 2018-04-17 NOTE — Anesthesia Procedure Notes (Signed)
Procedure Name: Intubation Date/Time: 04/17/2018 8:53 AM Performed by: Barrington Ellison, CRNA Pre-anesthesia Checklist: Patient identified, Emergency Drugs available, Suction available and Patient being monitored Patient Re-evaluated:Patient Re-evaluated prior to induction Oxygen Delivery Method: Circle System Utilized Preoxygenation: Pre-oxygenation with 100% oxygen Induction Type: IV induction Ventilation: Mask ventilation without difficulty Laryngoscope Size: Mac and 3 Grade View: Grade I Tube type: Oral Tube size: 8.0 mm Number of attempts: 1 Airway Equipment and Method: Stylet and Oral airway Placement Confirmation: ETT inserted through vocal cords under direct vision,  positive ETCO2 and breath sounds checked- equal and bilateral Secured at: 23 (at teeth) cm Tube secured with: Tape Dental Injury: Teeth and Oropharynx as per pre-operative assessment

## 2018-04-17 NOTE — Progress Notes (Signed)
Assessment was at 0735  A.A., RN

## 2018-04-17 NOTE — Anesthesia Procedure Notes (Signed)
Central Venous Catheter Insertion Performed by: Arta Brucessey, Aijalon Kirtz, MD, anesthesiologist Start/End12/08/2017 7:30 AM, 04/17/2018 7:40 AM Patient location: Pre-op. Preanesthetic checklist: patient identified, IV checked, risks and benefits discussed, surgical consent, monitors and equipment checked, pre-op evaluation, timeout performed and anesthesia consent Position: Trendelenburg Lidocaine 1% used for infiltration and patient sedated Hand hygiene performed  and maximum sterile barriers used  Catheter size: 8.5 Fr PA cath was placed.Sheath introducer Swan type:thermodilution Procedure performed using ultrasound guided technique. Ultrasound Notes:anatomy identified, needle tip was noted to be adjacent to the nerve/plexus identified, no ultrasound evidence of intravascular and/or intraneural injection and image(s) printed for medical record Attempts: 1 Following insertion, line sutured, dressing applied and Biopatch. Post procedure assessment: blood return through all ports, free fluid flow and no air  Patient tolerated the procedure well with no immediate complications.

## 2018-04-17 NOTE — Procedures (Signed)
Extubation Procedure Note  Patient Details:   Name: [name] DOB: 1950-11-05 MRN: [id number]   Airway Documentation:    Vent end date: 04/17/18 Vent end time: 2100   Evaluation  O2 sats: stable throughout Complications: No apparent complications Patient did tolerate procedure well. Bilateral Breath Sounds: Clear, Diminished   Yes  Pt able to verbalize name and current locations, no stridor noted, cuff leak positive, pt able to cough and clear secretions. NIF -25, VC 1.5L. ABG within normal limits for extubation. Pt extubated to 4L  w/humidity. RT will continue to monitor.   Reginia NaasWendy M Haim Hansson 04/17/2018, 9:08 PM

## 2018-04-17 NOTE — Progress Notes (Signed)
  Echocardiogram Echocardiogram Transesophageal has been performed.  Gerda Dissrthur L Rehaan Viloria 04/17/2018, 1:19 PM

## 2018-04-17 NOTE — Brief Op Note (Addendum)
      [address]       [place] [id number]             [phone]      04/17/2018  12:26 PM  PATIENT:  [name]  67 y.o. female  PRE-OPERATIVE DIAGNOSIS:  CAD  POST-OPERATIVE DIAGNOSIS:  CAD  PROCEDURE: TRANSESOPHAGEAL ECHOCARDIOGRAM (TEE) MEDIAN STERNOTOMY for CORONARY ARTERY BYPASS GRAFTING (CABG) TIMES 4 (LIMA to LAD, SVG to DIAGONAL, SVG to OM, SVG to RCA)  USING LEFT INTERNAL MAMMARY ARTERY AND RIGHT GREATER SAPHENOUS VEIN HARVESTED ENDOSCOPICALLY  SURGEON:  Surgeon(s) and Role:    Delight OvensGerhardt, Megan Hayduk B, MD - Primary  PHYSICIAN ASSISTANT: Doree Fudgeonielle Zimmerman PA-C  ASSISTANTS: Virgilio FreesKim Hardy RNFA  ANESTHESIA:   general  EBL:  Per anesthesia and perfusion record  DRAINS: Chest tubes placed in the mediastinal and pleural spaces   COUNTS CORRECT:  YES  DICTATION: .Dragon Dictation  PLAN OF CARE: Admit to inpatient   PATIENT DISPOSITION:  ICU - intubated and hemodynamically stable.   Delay start of Pharmacological VTE agent (>24hrs) due to surgical blood loss or risk of bleeding: yes  BASELINE WEIGHT: 72.5 kg

## 2018-04-17 NOTE — Anesthesia Procedure Notes (Signed)
Arterial Line Insertion Start/End12/08/2017 8:10 AM, 04/17/2018 8:15 AM Performed by: Shelton SilvasHollis, Kevin D, MD, anesthesiologist  Patient location: Pre-op. Preanesthetic checklist: patient identified, IV checked, site marked, risks and benefits discussed, surgical consent, monitors and equipment checked, pre-op evaluation, timeout performed and anesthesia consent Lidocaine 1% used for infiltration radial was placed Catheter size: 20 Fr Hand hygiene performed  and maximum sterile barriers used   Attempts: 1 Procedure performed without using ultrasound guided technique. Following insertion, dressing applied. Post procedure assessment: normal and unchanged  Patient tolerated the procedure well with no immediate complications.

## 2018-04-17 NOTE — Progress Notes (Signed)
ANTICOAGULATION CONSULT NOTE   Pharmacy Consult for Heparin Indication: chest pain/ACS  Allergies  Allergen Reactions  . Dye Fdc Blue  [Brilliant Blue Fcf] Anaphylaxis    Dye used for CAT scans  . Contrast Media [Iodinated Diagnostic Agents]     Patient Measurements: Height: 5' (152.4 cm) Weight: 159 lb 13.3 oz (72.5 kg) IBW/kg (Calculated) : 45.5 Heparin Dosing Weight: 62 kg  Vital Signs: Temp: 98.4 F (36.9 C) (12/04 0343) Temp Source: Oral (12/04 0343) BP: 99/58 (12/04 0343) Pulse Rate: 87 (12/04 0343)  Labs: Recent Labs    04/15/18 1625 04/15/18 2115  04/16/18 0229 04/16/18 0839 04/16/18 1832 04/16/18 2047 04/16/18 2059 04/17/18 0430  HGB 13.8  --   --  13.8  --   --   --   --  12.1  HCT 41.0  --   --  42.7  --   --   --   --  36.2  PLT 280  --   --  311  --   --   --   --  273  APTT  --   --   --   --   --   --  46*  --   --   LABPROT  --   --   --   --   --  12.7  --   --   --   INR  --   --   --   --   --  0.96  --   --   --   HEPARINUNFRC  --   --    < > 0.54 0.51  --   --  0.19* 0.48  CREATININE 0.77  --   --  0.92  --  0.92  --   --   --   TROPONINI  --  0.58*  --  0.34* 0.19*  --   --   --   --    < > = values in this interval not displayed.    Estimated Creatinine Clearance: 51.3 mL/min (by C-G formula based on SCr of 0.92 mg/dL).   Medical History: Past Medical History:  Diagnosis Date  . Arthritis   . Chicken pox   . Dyslipidemia   . Genital herpes   . GERD (gastroesophageal reflux disease)   . Hepatitis B   . Hypothyroidism   . Kidney stone   . Pancreatitis     Medications:  Medications Prior to Admission  Medication Sig Dispense Refill Last Dose  . acyclovir (ZOVIRAX) 400 MG tablet Take 1 tablet (400 mg total) by mouth 2 (two) times daily. 180 tablet 3 Taking  . atorvastatin (LIPITOR) 40 MG tablet Take 1 tablet (40 mg total) by mouth daily. 90 tablet 3 Taking  . docusate sodium (COLACE) 100 MG capsule Take 100 mg by mouth daily as  needed for mild constipation.   Taking  . ibuprofen (ADVIL,MOTRIN) 200 MG tablet Take 200 mg by mouth every 6 (six) hours as needed.   Taking  . levothyroxine (SYNTHROID) 88 MCG tablet Take 1 tablet (88 mcg total) by mouth daily before breakfast. 90 tablet 3 Taking  . omeprazole (PRILOSEC) 40 MG capsule Take 1 capsule (40 mg total) by mouth daily. 90 capsule 3 Taking    Assessment: [name] who presented with chest pain. Initial troponin is elevated. Pharmacy consulted to start IV heparin for ACS. H/H and Plt wnl. Patient states she is not on any anticoagulants at home.   Heparin  level at goal x 2, no issues noted. Patient is s/p cath with multivessel CAD and plans for CABG.  Will restart heaprin 8hr after radial sheath removed - out at 130 restart at 1630.    12/4 AM update: heparin level therapeutic x 1 after rate increase  Goal of Therapy:  Heparin level 0.3-0.7 units/ml Monitor platelets by anticoagulation protocol: Yes   Plan:  Cont heparin at 850 units/hr Re-check heparin level in 6-8 hours  Abran Duke, PharmD, BCPS Clinical Pharmacist Phone: [phone]

## 2018-04-17 NOTE — Progress Notes (Signed)
RT started Heart protocol wean. Pt settings 500vt/rate 4/Peep of 5 w/PS of 10/ FIO2 of 40%. Pt was able to lift head off of pillow and hold, squeeze hands, move feet upon request. Pt tolerating wean thus far. RT will continue to monitor.

## 2018-04-18 ENCOUNTER — Inpatient Hospital Stay (HOSPITAL_COMMUNITY): Payer: Medicare Other

## 2018-04-18 ENCOUNTER — Encounter (HOSPITAL_COMMUNITY): Payer: Self-pay | Admitting: Cardiothoracic Surgery

## 2018-04-18 DIAGNOSIS — Z951 Presence of aortocoronary bypass graft: Secondary | ICD-10-CM

## 2018-04-18 LAB — BASIC METABOLIC PANEL
Anion gap: 9 (ref 5–15)
BUN: 9 mg/dL (ref 8–23)
CALCIUM: 7.8 mg/dL — AB (ref 8.9–10.3)
CO2: 22 mmol/L (ref 22–32)
Chloride: 105 mmol/L (ref 98–111)
Creatinine, Ser: 0.62 mg/dL (ref 0.44–1.00)
GFR calc Af Amer: >60 mL/min (ref >60)
Glucose, Bld: 124 mg/dL — ABNORMAL HIGH (ref 70–99)
Potassium: 4.3 mmol/L (ref 3.5–5.1)
Sodium: 136 mmol/L (ref 135–145)

## 2018-04-18 LAB — CBC
HCT: 28.2 % — ABNORMAL LOW (ref 36.0–46.0)
HCT: 21.2 % — ABNORMAL LOW (ref 36.0–46.0)
HCT: 26.7 % — ABNORMAL LOW (ref 36.0–46.0)
Hemoglobin: 9 g/dL — ABNORMAL LOW (ref 12.0–15.0)
Hemoglobin: 6.6 g/dL — CL (ref 12.0–15.0)
Hemoglobin: 8.5 g/dL — ABNORMAL LOW (ref 12.0–15.0)
MCH: 32.5 pg (ref 26.0–34.0)
MCH: 32.4 pg (ref 26.0–34.0)
MCH: 32.5 pg (ref 26.0–34.0)
MCHC: 31.9 g/dL (ref 30.0–36.0)
MCHC: 31.1 g/dL (ref 30.0–36.0)
MCHC: 31.8 g/dL (ref 30.0–36.0)
MCV: 101.8 fL — ABNORMAL HIGH (ref 80.0–100.0)
MCV: 101.9 fL — ABNORMAL HIGH (ref 80.0–100.0)
MCV: 104.4 fL — ABNORMAL HIGH (ref 80.0–100.0)
Platelets: 147 10*3/uL — ABNORMAL LOW (ref 150–400)
Platelets: 121 10*3/uL — ABNORMAL LOW (ref 150–400)
Platelets: 139 10*3/uL — ABNORMAL LOW (ref 150–400)
RBC: 2.77 MIL/uL — ABNORMAL LOW (ref 3.87–5.11)
RBC: 2.03 MIL/uL — ABNORMAL LOW (ref 3.87–5.11)
RBC: 2.62 MIL/uL — ABNORMAL LOW (ref 3.87–5.11)
RDW: 12.4 % (ref 11.5–15.5)
RDW: 12.6 % (ref 11.5–15.5)
RDW: 12.6 % (ref 11.5–15.5)
WBC: 8.6 10*3/uL (ref 4.0–10.5)
WBC: 6.7 10*3/uL (ref 4.0–10.5)
WBC: 9 10*3/uL (ref 4.0–10.5)
nRBC: 0 % (ref 0.0–0.2)
nRBC: 0 % (ref 0.0–0.2)
nRBC: 0 % (ref 0.0–0.2)

## 2018-04-18 LAB — GLUCOSE, CAPILLARY
Glucose-Capillary: 106 mg/dL — ABNORMAL HIGH (ref 70–99)
Glucose-Capillary: 112 mg/dL — ABNORMAL HIGH (ref 70–99)
Glucose-Capillary: 118 mg/dL — ABNORMAL HIGH (ref 70–99)
Glucose-Capillary: 119 mg/dL — ABNORMAL HIGH (ref 70–99)
Glucose-Capillary: 120 mg/dL — ABNORMAL HIGH (ref 70–99)
Glucose-Capillary: 122 mg/dL — ABNORMAL HIGH (ref 70–99)
Glucose-Capillary: 123 mg/dL — ABNORMAL HIGH (ref 70–99)
Glucose-Capillary: 124 mg/dL — ABNORMAL HIGH (ref 70–99)
Glucose-Capillary: 94 mg/dL (ref 70–99)

## 2018-04-18 LAB — POCT I-STAT 3, ART BLOOD GAS (G3+)
Acid-base deficit: 3 mmol/L — ABNORMAL HIGH (ref 0.0–2.0)
Bicarbonate: 22.3 mmol/L (ref 20.0–28.0)
O2 Saturation: 98 %
PH ART: 7.353 (ref 7.350–7.450)
Patient temperature: 37.4
TCO2: 23 mmol/L (ref 22–32)
pCO2 arterial: 40.2 mmHg (ref 32.0–48.0)
pO2, Arterial: 111 mmHg — ABNORMAL HIGH (ref 83.0–108.0)

## 2018-04-18 LAB — POCT I-STAT, CHEM 8
BUN: 9 mg/dL (ref 8–23)
BUN: 8 mg/dL (ref 8–23)
CREATININE: 0.5 mg/dL (ref 0.44–1.00)
Calcium, Ion: 1.16 mmol/L (ref 1.15–1.40)
Calcium, Ion: 1.14 mmol/L — ABNORMAL LOW (ref 1.15–1.40)
Chloride: 104 mmol/L (ref 98–111)
Chloride: 101 mmol/L (ref 98–111)
Creatinine, Ser: 0.4 mg/dL — ABNORMAL LOW (ref 0.44–1.00)
Glucose, Bld: 121 mg/dL — ABNORMAL HIGH (ref 70–99)
Glucose, Bld: 110 mg/dL — ABNORMAL HIGH (ref 70–99)
HCT: 28 % — ABNORMAL LOW (ref 36.0–46.0)
HEMATOCRIT: 28 % — AB (ref 36.0–46.0)
HEMOGLOBIN: 9.5 g/dL — AB (ref 12.0–15.0)
Hemoglobin: 9.5 g/dL — ABNORMAL LOW (ref 12.0–15.0)
POTASSIUM: 4.3 mmol/L (ref 3.5–5.1)
Potassium: 4.2 mmol/L (ref 3.5–5.1)
Sodium: 136 mmol/L (ref 135–145)
Sodium: 134 mmol/L — ABNORMAL LOW (ref 135–145)
TCO2: 24 mmol/L (ref 22–32)
TCO2: 24 mmol/L (ref 22–32)

## 2018-04-18 LAB — MAGNESIUM: Magnesium: 2.5 mg/dL — ABNORMAL HIGH (ref 1.7–2.4)

## 2018-04-18 MED ORDER — INSULIN ASPART 100 UNIT/ML ~~LOC~~ SOLN: 0.0000 [IU] | SUBCUTANEOUS | Status: DC

## 2018-04-18 MED ORDER — METOCLOPRAMIDE HCL 5 MG/ML IJ SOLN
10.0000 mg | Freq: Four times a day (QID) | INTRAMUSCULAR | Status: AC
  Administered 2018-04-18 – 2018-04-19 (×4): 10 mg via INTRAVENOUS
  Filled 2018-04-18 (×4): qty 2

## 2018-04-18 MED ORDER — ORAL CARE MOUTH RINSE
15.0000 mL | Freq: Two times a day (BID) | OROMUCOSAL | Status: DC
  Administered 2018-04-19 – 2018-04-22 (×5): 15 mL via OROMUCOSAL

## 2018-04-18 MED ORDER — ENOXAPARIN SODIUM 40 MG/0.4ML ~~LOC~~ SOLN
40.0000 mg | Freq: Every day | SUBCUTANEOUS | Status: DC
  Administered 2018-04-18 – 2018-04-22 (×5): 40 mg via SUBCUTANEOUS
  Filled 2018-04-18 (×5): qty 0.4

## 2018-04-18 MED ORDER — FENTANYL CITRATE (PF) 100 MCG/2ML IJ SOLN
12.5000 ug | INTRAMUSCULAR | Status: DC | PRN
  Administered 2018-04-18: 25 ug via INTRAVENOUS
  Filled 2018-04-18 (×2): qty 2

## 2018-04-18 NOTE — Plan of Care (Signed)
Patient drowsy, follows commands.  Preparing to begin weaning to extubate.  Monitoring as per protocol.  Titrating neo gtt to maintain adequate BP as per parameters.

## 2018-04-18 NOTE — Progress Notes (Addendum)
T[facility] BRIEF SICU PROGRESS NOTE  1 Day Post-Op  S/P Procedure(s) (LRB): CORONARY ARTERY BYPASS GRAFTING (CABG) TIMES USING LEFT INTERNAL MAMMARY ARTERY TO LAD AND SAPHENOUS VEIN TO DIAG 1, OM1, AND RCA (N/A) TRANSESOPHAGEAL ECHOCARDIOGRAM (TEE) (N/A) ENDOVEIN HARVEST OF GREATER SAPHENOUS VEIN RIGHT THIGH AND CALF (Right)   Stable day NSR w/ stable BP although still on low dose Neo Breathing comfortably w/ O2 sats 99% UOP 25-30 mL/hr Labs okay  Plan: Continue current plan  Kimberly Nailslarence H Andrews Tener, MD 04/18/2018 6:31 PM

## 2018-04-18 NOTE — Discharge Summary (Signed)
Physician Discharge Summary  Patient ID: Reginal LutesCarolyn Hancock MRN: [id number] DOB/AGE: 12/19/1950 67 y.o.  Admit date: 04/15/2018 Discharge date: 04/23/2018  Admission Diagnoses:  Patient Active Problem List   Diagnosis Date Noted  . Coronary artery disease 04/17/2018  . NSTEMI (non-ST elevated myocardial infarction) (HCC) 04/15/2018  . Pure hypercholesterolemia   . Bilateral carotid bruits   . Systolic murmur   . Osteopenia 11/30/2017  . GERD (gastroesophageal reflux disease) 06/11/2016  . Osteoarthritis 06/11/2016  . Hypothyroidism (acquired) 06/11/2016  . Dyslipidemia 06/11/2016  . HSV-2 (herpes simplex virus 2) infection 06/11/2016   Discharge Diagnoses:   Patient Active Problem List   Diagnosis Date Noted  . S/P CABG x 4 04/17/2018  . Coronary artery disease 04/17/2018  . NSTEMI (non-ST elevated myocardial infarction) (HCC) 04/15/2018  . Pure hypercholesterolemia   . Bilateral carotid bruits   . Systolic murmur   . Osteopenia 11/30/2017  . GERD (gastroesophageal reflux disease) 06/11/2016  . Osteoarthritis 06/11/2016  . Hypothyroidism (acquired) 06/11/2016  . Dyslipidemia 06/11/2016  . HSV-2 (herpes simplex virus 2) infection 06/11/2016   Discharged Condition: good  History of Present Illness:  [name] is a 67 yo white female with known history of Aortic Stenosis, Hypothyroidism, Hyperlipidemia, Hepatitis B, H/O Alcoholism quite drinking in 1991, H/o Pancreatitis, Nicotine abuse of 3-4 ppd for 27 years quitting in 1992, and Genital Herpes.  She presented to her PCP with a 1 month complaint of right sided chest pain with radiation to her right arm and back.  She denied shortness of breath, palpitations, nausea and vomiting.  The PCP obtained troponin level which came back positive and she was called to present to the ED.  Upon arrival EKG showed no acute ST changes.  Her repeat Troponin level was 0.58.  She was ruled in for NSTEMI and admitted for further care.  She  underwent cardiac catheterization which showed multivessel CAD which was felt to be amenable to CABG and Cardiothoracic surgery consult was requested and she was admitted for further care.  Hospital Course:   She remained chest pain free during admission.  She was evaluated by Dr. Tyrone SageGerhardt who felt coronary bypass grafting would be the best treatment option for the patient.  The risks and benefits of the procedure were explained to the patient and he was agreeable to proceed.  She was taken to the operating room on 04/17/2018.  She underwent CABG x 4 utilizing LIMA to LAD, SVG to Diagonal, SVG to OM, and SVG to RCA.  She also underwent endoscopic harvest of her greater saphenous vein from her right leg.  She tolerated the procedure without difficulty and was taken to the SICU in stable condition.  She was extubated the evening of surgery.  During her stay in the SICU the patient was weaned off Neo-synephrine as hemodynamics allowed.  Her arterial line and mediastinal chest tubes were removed on 04/18/2018.  Her pleural chest tube was removed on 04/19/2018.  Follow up CXR was stable and free of pneumothorax and significant pleural effusion.  She was maintaining NSR. She does have PVC's  She had difficulty with nausea due to pain medication.  This relieved with use of reglan and zofran. Incisions are healing well. She is tolerating routine cardiac rehab.  Post op ABL anemia is mild. Renal function is normal . Blood sugars are well controlled She was medically stable for discharge after evaluation by Dr Tyrone SageGerhardt on the morning of 04/23/2018  Significant Diagnostic Studies: angiography:    Prox RCA  to Mid RCA lesion is 60% stenosed.  Mid RCA to Dist RCA lesion is 20% stenosed.  Mid LM to Dist LM lesion is 40% stenosed.  Ost Cx to Prox Cx lesion is 99% stenosed.  Prox LAD lesion is 99% stenosed.  Prox LAD to Mid LAD lesion is 90% stenosed.   1. Mild to moderate eccentric distal left main stenosis.  2.  Severe ostial Circumflex stenosis. 3. Severe, heavily calcified serial mid LAD stenoses 4. Moderate, heavily calcified mid RCA stenosis in the large dominant RCA   Treatments: surgery:  OPERATIVE REPORT  DATE OF PROCEDURE:  04/17/2018  PREOPERATIVE DIAGNOSES:  Severe 3-vessel coronary artery disease with recent non-ST elevation myocardial infarction.  POSTOPERATIVE DIAGNOSIS:  Severe 3-vessel coronary artery disease with recent non-ST elevation myocardial infarction.  SURGICAL PROCEDURE:  Coronary artery bypass grafting x4 with the left internal mammary to the left anterior descending coronary artery, reverse saphenous vein graft to the first diagonal coronary artery, reverse saphenous vein graft to the first obtuse  marginal and reverse saphenous vein graft to the distal right coronary artery with right greater saphenous thigh and calf endoscopic vein harvesting.  SURGEON:  Sheliah Plane, MD  FIRST ASSISTANT:  Doree Fudge, PA. Discharge Exam: Blood pressure (!) 141/77, pulse 88, temperature 98.5 F (36.9 C), temperature source Oral, resp. rate 16, height 5' (1.524 m), weight 72.3 kg, SpO2 99 %.  General appearance: alert and cooperative Neurologic: intact Heart: regular rate and rhythm, S1, S2 normal, no murmur, click, rub or gallop Lungs: clear to auscultation bilaterally Abdomen: soft, non-tender; bowel sounds normal; no masses,  no organomegaly Extremities: extremities normal, atraumatic, no cyanosis or edema and Homans sign is negative, no sign of DVT Wound: Sternal incision is stable and healing well   Disposition: Discharge disposition: 01-Home or Self Care       Discharge Instructions    Discharge patient   Complete by:  As directed    Discharge disposition:  01-Home or Self Care   Discharge patient date:  04/23/2018     Allergies as of 04/23/2018      Reactions   Dye Fdc Blue  [brilliant Blue Fcf] Anaphylaxis   Dye used for CAT scans    Contrast Media [iodinated Diagnostic Agents]       Medication List    STOP taking these medications   ibuprofen 200 MG tablet Commonly known as:  ADVIL,MOTRIN     TAKE these medications   acetaminophen 325 MG tablet Commonly known as:  TYLENOL Take 2 tablets (650 mg total) by mouth every 6 (six) hours as needed for mild pain.   acyclovir 400 MG tablet Commonly known as:  ZOVIRAX Take 1 tablet (400 mg total) by mouth 2 (two) times daily.   aspirin 325 MG EC tablet Take 1 tablet (325 mg total) by mouth every 6 (six) hours as needed.   atorvastatin 80 MG tablet Commonly known as:  LIPITOR Take 1 tablet (80 mg total) by mouth daily at 6 PM. What changed:    medication strength  how much to take  when to take this   docusate sodium 100 MG capsule Commonly known as:  COLACE Take 100 mg by mouth at bedtime.   guaiFENesin 600 MG 12 hr tablet Commonly known as:  MUCINEX Take 1 tablet (600 mg total) by mouth every 12 (twelve) hours as needed for cough.   levothyroxine 88 MCG tablet Commonly known as:  SYNTHROID, LEVOTHROID Take 1 tablet (88 mcg total) by mouth  daily before breakfast.   metoprolol tartrate 25 MG tablet Commonly known as:  LOPRESSOR Take 0.5 tablets (12.5 mg total) by mouth 2 (two) times daily.   omeprazole 40 MG capsule Commonly known as:  PRILOSEC Take 1 capsule (40 mg total) by mouth daily.   traMADol 50 MG tablet Commonly known as:  ULTRAM Take 1 tablet (50 mg total) by mouth every 6 (six) hours as needed for moderate pain.       The patient has been discharged on:   1.Beta Blocker:  Yes [ y  ]                              No   [   ]                              If No, reason:  2.Ace Inhibitor/ARB: Yes [   ]                                     No  [  n  ]                                     If No, reason:soft BP at times  3.Statin:   Yes [  y ]                  No  [   ]                  If No, reason:  4.Ecasa:  Yes  [ y  ]                   No   [   ]                  If No, reason:   Signed: Rowe Clack 04/23/2018, 9:15 AM

## 2018-04-18 NOTE — Plan of Care (Signed)
CRITICAL VALUE ALERT  Critical Value:  Hbg 6.6  Date & Time Notied 04/18/18 1611  Provider Notified:   Orders Received/Actions taken: Repeat

## 2018-04-18 NOTE — Plan of Care (Signed)
  Problem: Education: Goal: Knowledge of disease or condition will improve Outcome: Progressing Goal: Knowledge of the prescribed therapeutic regimen will improve Outcome: Progressing   Problem: Activity: Goal: Risk for activity intolerance will decrease Outcome: Progressing   Problem: Cardiac: Goal: Will achieve and/or maintain hemodynamic stability Outcome: Progressing   Problem: Clinical Measurements: Goal: Postoperative complications will be avoided or minimized Outcome: Progressing   Problem: Respiratory: Goal: Respiratory status will improve Outcome: Progressing   

## 2018-04-18 NOTE — Progress Notes (Signed)
Patient met parameters for extubation.  Tolerated well.  ISP initiated and charted.

## 2018-04-18 NOTE — Plan of Care (Signed)
  Problem: Health Behavior/Discharge Planning: Goal: Ability to manage health-related needs will improve Outcome: Completed/Met   Problem: Clinical Measurements: Goal: Ability to maintain clinical measurements within normal limits will improve Outcome: Completed/Met Goal: Will remain free from infection Outcome: Completed/Met Goal: Diagnostic test results will improve Outcome: Completed/Met Goal: Respiratory complications will improve Outcome: Completed/Met Goal: Cardiovascular complication will be avoided Outcome: Completed/Met   Problem: Activity: Goal: Risk for activity intolerance will decrease Outcome: Completed/Met   Problem: Nutrition: Goal: Adequate nutrition will be maintained Outcome: Completed/Met   Problem: Pain Managment: Goal: General experience of comfort will improve Outcome: Completed/Met   Problem: Safety: Goal: Ability to remain free from injury will improve Outcome: Completed/Met   Problem: Skin Integrity: Goal: Risk for impaired skin integrity will decrease Outcome: Completed/Met   Problem: Education: Goal: Understanding of CV disease, CV risk reduction, and recovery process will improve Outcome: Completed/Met   Problem: Activity: Goal: Ability to return to baseline activity level will improve Outcome: Completed/Met   Problem: Cardiovascular: Goal: Vascular access site(s) Level 0-1 will be maintained Outcome: Completed/Met   Problem: Health Behavior/Discharge Planning: Goal: Ability to safely manage health-related needs after discharge will improve Outcome: Completed/Met   Problem: Spiritual Needs Goal: Ability to function at adequate level Outcome: Completed/Met

## 2018-04-18 NOTE — Progress Notes (Addendum)
TCTS DAILY ICU PROGRESS NOTE                   [address]            [place] [address]          [phone]   1 Day Post-Op Procedure(s) (LRB): CORONARY ARTERY BYPASS GRAFTING (CABG) TIMES USING LEFT INTERNAL MAMMARY ARTERY TO LAD AND SAPHENOUS VEIN TO DIAG 1, OM1, AND RCA (N/A) TRANSESOPHAGEAL ECHOCARDIOGRAM (TEE) (N/A) ENDOVEIN HARVEST OF GREATER SAPHENOUS VEIN RIGHT THIGH AND CALF (Right)  Total Length of Stay:  LOS: 3 days   Subjective:  Patient states she isn't doing well this morning.  She complains of pain and nausea.  She states the morphine makes her nauseated but the zofran doesn't provide much relief.  Objective: Vital signs in last 24 hours: Temp:  [96.3 F (35.7 C)-99.5 F (37.5 C)] 99.1 F (37.3 C) (12/05 0700) Pulse Rate:  [87-95] 91 (12/05 0700) Cardiac Rhythm: Atrial paced (12/04 1915) Resp:  [9-24] 14 (12/05 0700) BP: (57-131)/(24-80) 77/57 (12/05 0700) SpO2:  [97 %-100 %] 98 % (12/05 0700) Arterial Line BP: (84-140)/(46-74) 96/51 (12/05 0700) FiO2 (%):  [40 %-50 %] 40 % (12/04 2025) Weight:  [76.7 kg] 76.7 kg (12/05 0500)  Filed Weights   04/15/18 1623 04/15/18 2155 04/18/18 0500  Weight: 73.5 kg 72.5 kg 76.7 kg    Weight change:    Hemodynamic parameters for last 24 hours: PAP: (19-31)/(9-18) 28/14 CO:  [3 L/min-5.1 L/min] 5.1 L/min CI:  [1.8 L/min/m2-3 L/min/m2] 3 L/min/m2  Intake/Output from previous day: 12/04 0701 - 12/05 0700 In: 7137.9 [P.O.:320; I.V.:4947.9; Blood:320; IV [id number]:8299] Out: 3716 [RCVEL:3810; Emesis/NG output:205; Blood:550; Chest Tube:400]  Current Meds: Scheduled Meds: . acetaminophen  1,000 mg Oral Q6H   Or  . acetaminophen (TYLENOL) oral liquid 160 mg/5 mL  1,000 mg Per Tube Q6H  . acyclovir  400 mg Oral BID  . aspirin EC  325 mg Oral Daily   Or  . aspirin  324 mg Per Tube Daily  . atorvastatin  80 mg Oral q1800  . chlorhexidine gluconate (MEDLINE KIT)  15 mL Mouth Rinse BID  .  Chlorhexidine Gluconate Cloth  6 each Topical Daily  . docusate sodium  200 mg Oral Daily  . enoxaparin (LOVENOX) injection  40 mg Subcutaneous QHS  . insulin aspart  0-24 Units Subcutaneous Q4H  . insulin aspart  0-24 Units Subcutaneous Q4H  . levothyroxine  88 mcg Oral QAC breakfast  . mouth rinse  15 mL Mouth Rinse 10 times per day  . metoCLOPramide (REGLAN) injection  10 mg Intravenous Q6H  . metoprolol tartrate  12.5 mg Oral BID   Or  . metoprolol tartrate  12.5 mg Per Tube BID  . [START ON 04/19/2018] pantoprazole  40 mg Oral Daily  . sodium chloride flush  10-40 mL Intracatheter Q12H   Continuous Infusions: . sodium chloride 20 mL/hr at 04/18/18 0700  . sodium chloride Stopped (04/18/18 0522)  . sodium chloride    . albumin human 12.5 g (04/17/18 1938)  . cefUROXime (ZINACEF)  IV Stopped (04/18/18 0432)  . dexmedetomidine (PRECEDEX) IV infusion Stopped (04/17/18 1745)  . famotidine (PEPCID) IV 20 mg (04/17/18 1500)  . lactated ringers 20 mL/hr at 04/18/18 0700  . lactated ringers 20 mL/hr at 04/18/18 0500  . nitroGLYCERIN    . phenylephrine (NEO-SYNEPHRINE) Adult infusion 40 mcg/min (04/18/18 0700)   PRN Meds:.sodium chloride, albumin human, fentaNYL (SUBLIMAZE) injection,  metoprolol tartrate, midazolam, ondansetron (ZOFRAN) IV, oxyCODONE, sodium chloride flush, traMADol  General appearance: alert, cooperative and no distress Heart: regular rate and rhythm Lungs: clear to auscultation bilaterally Abdomen: soft, non-tender; bowel sounds normal; no masses,  no organomegaly Extremities: edema trace Wound: clean and dry EVH site, aquacel in place on sternotomy  Lab Results: CBC: Recent Labs    04/17/18 2036  04/18/18 0345 04/18/18 0402  WBC 8.6  --  8.6  --   HGB 9.0*   < > 9.0* 9.5*  HCT 27.5*   < > 28.2* 28.0*  PLT 138*  --  147*  --    < > = values in this interval not displayed.   BMET:  Recent Labs    04/17/18 0430  04/18/18 0345 04/18/18 0402  NA 139   <  > 136 136  K 5.0   < > 4.3 4.3  CL 108   < > 105 104  CO2 21*  --  22  --   GLUCOSE 118*   < > 124* 121*  BUN 14   < > 9 9  CREATININE 0.84   < > 0.62 0.50  CALCIUM 9.2  --  7.8*  --    < > = values in this interval not displayed.    CMET: Lab Results  Component Value Date   WBC 8.6 04/18/2018   HGB 9.5 (L) 04/18/2018   HCT 28.0 (L) 04/18/2018   PLT 147 (L) 04/18/2018   GLUCOSE 121 (H) 04/18/2018   CHOL 215 (H) 04/16/2018   TRIG 69 04/16/2018   HDL 72 04/16/2018   LDLCALC 129 (H) 04/16/2018   ALT 17 04/16/2018   AST 29 04/16/2018   NA 136 04/18/2018   K 4.3 04/18/2018   CL 104 04/18/2018   CREATININE 0.50 04/18/2018   BUN 9 04/18/2018   CO2 22 04/18/2018   TSH 1.43 04/15/2018   INR 1.25 04/17/2018   HGBA1C 5.3 04/16/2018      PT/INR:  Recent Labs    04/17/18 1504  LABPROT 15.6*  INR 1.25   Radiology: Dg Chest Port 1 View  Result Date: 04/17/2018 CLINICAL DATA:  Status post CABG. EXAM: PORTABLE CHEST 1 VIEW COMPARISON:  04/15/2018 FINDINGS: Patient is intubated. The endotracheal tube is in satisfactory radiographic position. Enteric catheter is collimated off the image, but tip is seen within the area of the gastric cardia. Right internal jugular approach Swan-Ganz catheter terminates at the expected location of the pulmonary trunk. Postsurgical changes from CABG. Left-sided chest drain is also present. Cardiomediastinal silhouette is normal. Mediastinal contours appear intact. Calcific atherosclerotic disease of the aorta. There is no evidence of pneumothorax. Left lower lobe atelectasis. Small left pleural effusion cannot be excluded. Osseous structures are without acute abnormality. Soft tissues are grossly normal. IMPRESSION: Postsurgical changes from recent CABG. Left lower lobe atelectasis. Left pleural effusion cannot be excluded. Electronically Signed   By: Fidela Salisbury M.D.   On: 04/17/2018 16:20     Assessment/Plan: S/P Procedure(s) (LRB): CORONARY  ARTERY BYPASS GRAFTING (CABG) TIMES USING LEFT INTERNAL MAMMARY ARTERY TO LAD AND SAPHENOUS VEIN TO DIAG 1, OM1, AND RCA (N/A) TRANSESOPHAGEAL ECHOCARDIOGRAM (TEE) (N/A) ENDOVEIN HARVEST OF GREATER SAPHENOUS VEIN RIGHT THIGH AND CALF (Right)  1. CV- NSR, remains on Neo-synephrine at 40 mcg, will wean as tolerated 2. Pulm- CT output okay, CXR without significant effusion, some atelectasis, will d/c MT chest tubes today, discuss timing of removal of pleural chest tubes with Dr. Servando Snare 3.  Renal- creatinine WNL, weight is mildly elevated, may need lasix in the future, will hold off for now as patient is on Neo  4. Expected post operative blood loss anemia, mild Hgb at 9.5 5. GI- nausea, zofran not giving much relief, will add reglan 6. Pain control- morphine giving patient nausea, will change to fentanyl prn for severe breakthrough pain, patient is willing to try oral pain medications 7. CBGs controlled not on insulin drip, will start SSIP 8. Dispo- patient stable, weaning Neo as BP allows, stop Morphine due to nausea, will try Fentanyl for severe break through pain, add reglan for additional nausea relief, POD #1 progression orders     Ellwood Handler 04/18/2018 7:53 AM  Expected Acute  Blood - loss Anemia- continue to monitor  I have seen and examined Ronny Flurry and agree with the above assessment  and plan.  Grace Isaac MD Beeper [phone] Office [phone] 04/18/2018 8:53 AM

## 2018-04-18 NOTE — Plan of Care (Signed)
Repeat Hbg 8.5 Dr. Tyrone SageGerhardt aware

## 2018-04-18 NOTE — Anesthesia Postprocedure Evaluation (Signed)
Anesthesia Post Note  Patient: [name]  Procedure(s) Performed: CORONARY ARTERY BYPASS GRAFTING (CABG) TIMES USING LEFT INTERNAL MAMMARY ARTERY TO LAD AND SAPHENOUS VEIN TO DIAG 1, OM1, AND RCA (N/A Chest) TRANSESOPHAGEAL ECHOCARDIOGRAM (TEE) (N/A Esophagus) ENDOVEIN HARVEST OF GREATER SAPHENOUS VEIN RIGHT THIGH AND CALF (Right Leg Lower)     Patient location during evaluation: SICU Anesthesia Type: General Level of consciousness: awake Pain management: pain level controlled Vital Signs Assessment: post-procedure vital signs reviewed and stable Respiratory status: spontaneous breathing Cardiovascular status: stable Postop Assessment: no apparent nausea or vomiting Anesthetic complications: no    Last Vitals:  Vitals:   04/18/18 2001 04/18/18 2100  BP:  (!) 77/57  Pulse:  78  Resp:  (!) 23  Temp: 37.3 C   SpO2:  98%    Last Pain:  Vitals:   04/18/18 2001  TempSrc: Oral  PainSc:                  Shelton SilvasKevin D Kynsleigh Westendorf

## 2018-04-19 ENCOUNTER — Inpatient Hospital Stay (HOSPITAL_COMMUNITY): Payer: Medicare Other

## 2018-04-19 LAB — CREATININE, SERUM
Creatinine, Ser: 0.67 mg/dL (ref 0.44–1.00)
GFR calc Af Amer: >60 mL/min (ref >60)
GFR calc non Af Amer: >60 mL/min (ref >60)

## 2018-04-19 LAB — CBC
HCT: 25.9 % — ABNORMAL LOW (ref 36.0–46.0)
Hemoglobin: 8.1 g/dL — ABNORMAL LOW (ref 12.0–15.0)
MCH: 31.6 pg (ref 26.0–34.0)
MCHC: 31.3 g/dL (ref 30.0–36.0)
MCV: 101.2 fL — ABNORMAL HIGH (ref 80.0–100.0)
Platelets: 131 10*3/uL — ABNORMAL LOW (ref 150–400)
RBC: 2.56 MIL/uL — ABNORMAL LOW (ref 3.87–5.11)
RDW: 12.2 % (ref 11.5–15.5)
WBC: 9.8 10*3/uL (ref 4.0–10.5)
nRBC: 0 % (ref 0.0–0.2)

## 2018-04-19 LAB — BASIC METABOLIC PANEL
Anion gap: 9 (ref 5–15)
BUN: 8 mg/dL (ref 8–23)
CO2: 23 mmol/L (ref 22–32)
Calcium: 8.1 mg/dL — ABNORMAL LOW (ref 8.9–10.3)
Chloride: 103 mmol/L (ref 98–111)
Creatinine, Ser: 0.69 mg/dL (ref 0.44–1.00)
GFR calc Af Amer: >60 mL/min (ref >60)
GFR calc non Af Amer: >60 mL/min (ref >60)
Glucose, Bld: 89 mg/dL (ref 70–99)
Potassium: 3.9 mmol/L (ref 3.5–5.1)
Sodium: 135 mmol/L (ref 135–145)

## 2018-04-19 LAB — GLUCOSE, CAPILLARY
GLUCOSE-CAPILLARY: 75 mg/dL (ref 70–99)
Glucose-Capillary: 68 mg/dL — ABNORMAL LOW (ref 70–99)
Glucose-Capillary: 70 mg/dL (ref 70–99)
Glucose-Capillary: 77 mg/dL (ref 70–99)
Glucose-Capillary: 85 mg/dL (ref 70–99)
Glucose-Capillary: 98 mg/dL (ref 70–99)

## 2018-04-19 LAB — MAGNESIUM: Magnesium: 2.2 mg/dL (ref 1.7–2.4)

## 2018-04-19 MED ORDER — METOCLOPRAMIDE HCL 5 MG/ML IJ SOLN
5.0000 mg | Freq: Three times a day (TID) | INTRAMUSCULAR | Status: AC
  Administered 2018-04-19 – 2018-04-20 (×4): 5 mg via INTRAVENOUS
  Filled 2018-04-19 (×4): qty 2

## 2018-04-19 MED FILL — Potassium Chloride Inj 2 mEq/ML: INTRAVENOUS | Qty: 40 | Status: AC

## 2018-04-19 MED FILL — Magnesium Sulfate Inj 50%: INTRAMUSCULAR | Qty: 10 | Status: AC

## 2018-04-19 MED FILL — Heparin Sodium (Porcine) Inj 1000 Unit/ML: INTRAMUSCULAR | Qty: 30 | Status: AC

## 2018-04-19 NOTE — Progress Notes (Signed)
      [address]       [place] [id number]             [phone]      POD # 2 CABG  BP 100/66 (BP Location: Right Arm)   Pulse 87   Temp 98.8 F (37.1 C) (Oral)   Resp 17   Ht 5' (1.524 m)   Wt 77.7 kg   SpO2 99%   BMI 33.45 kg/m   Intake/Output Summary (Last 24 hours) at 04/19/2018 1731 Last data filed at 04/19/2018 1400 Gross per 24 hour  Intake 1044.4 ml  Output 1410 ml  Net -365.6 ml   Nausea a little better  Viviann SpareSteven C. Dorris FetchHendrickson, MD [facility] [phone]

## 2018-04-19 NOTE — Progress Notes (Signed)
Patient ID: [name], female   DOB: 12-01-1950, 67 y.o.   MRN: [id number] TCTS DAILY ICU PROGRESS NOTE                   [address]            [place] [id number]          [phone]   2 Days Post-Op Procedure(s) (LRB): CORONARY ARTERY BYPASS GRAFTING (CABG) TIMES USING LEFT INTERNAL MAMMARY ARTERY TO LAD AND SAPHENOUS VEIN TO DIAG 1, OM1, AND RCA (N/A) TRANSESOPHAGEAL ECHOCARDIOGRAM (TEE) (N/A) ENDOVEIN HARVEST OF GREATER SAPHENOUS VEIN RIGHT THIGH AND CALF (Right)  Total Length of Stay:  LOS: 4 days   Subjective: Patient up in chair with complaint of nausea.   Objective: Vital signs in last 24 hours: Temp:  [98.1 F (36.7 C)-99.3 F (37.4 C)] 99.2 F (37.3 C) (12/06 0400) Pulse Rate:  [71-103] 84 (12/06 0700) Cardiac Rhythm: Normal sinus rhythm (12/06 0400) Resp:  [12-26] 16 (12/06 0700) BP: (71-112)/(34-74) 80/62 (12/06 0700) SpO2:  [91 %-100 %] 91 % (12/06 0700) Arterial Line BP: (84-144)/(44-81) 131/56 (12/06 0700) Weight:  [77.7 kg] 77.7 kg (12/06 0600)  Filed Weights   04/15/18 2155 04/18/18 0500 04/19/18 0600  Weight: 72.5 kg 76.7 kg 77.7 kg    Weight change: 1 kg   Hemodynamic parameters for last 24 hours: PAP: (24-43)/(11-29) 43/29  Intake/Output from previous day: 12/05 0701 - 12/06 0700 In: 2097.9 [P.O.:560; I.V.:1344.8; IV Piggyback:193] Out: 1445 [Urine:1265; Chest Tube:180]  Intake/Output this shift: No intake/output data recorded.  Current Meds: Scheduled Meds: . acetaminophen  1,000 mg Oral Q6H   Or  . acetaminophen (TYLENOL) oral liquid 160 mg/5 mL  1,000 mg Per Tube Q6H  . acyclovir  400 mg Oral BID  . aspirin EC  325 mg Oral Daily   Or  . aspirin  324 mg Per Tube Daily  . atorvastatin  80 mg Oral q1800  . Chlorhexidine Gluconate Cloth  6 each Topical Daily  . docusate sodium  200 mg Oral Daily  . enoxaparin (LOVENOX) injection  40 mg Subcutaneous QHS  . insulin aspart  0-24 Units Subcutaneous Q4H  . insulin  aspart  0-24 Units Subcutaneous Q4H  . levothyroxine  88 mcg Oral QAC breakfast  . mouth rinse  15 mL Mouth Rinse BID  . metoprolol tartrate  12.5 mg Oral BID   Or  . metoprolol tartrate  12.5 mg Per Tube BID  . pantoprazole  40 mg Oral Daily  . sodium chloride flush  10-40 mL Intracatheter Q12H   Continuous Infusions: . sodium chloride Stopped (04/18/18 1059)  . sodium chloride Stopped (04/18/18 0522)  . sodium chloride    . dexmedetomidine (PRECEDEX) IV infusion Stopped (04/17/18 1745)  . lactated ringers 20 mL/hr at 04/19/18 0700  . lactated ringers 20 mL/hr at 04/18/18 0500  . nitroGLYCERIN    . phenylephrine (NEO-SYNEPHRINE) Adult infusion 30 mcg/min (04/19/18 0700)   PRN Meds:.sodium chloride, fentaNYL (SUBLIMAZE) injection, metoprolol tartrate, midazolam, ondansetron (ZOFRAN) IV, oxyCODONE, sodium chloride flush, traMADol  General appearance: alert, cooperative and no distress Neurologic: intact Heart: regular rate and rhythm, S1, S2 normal, no murmur, click, rub or gallop Lungs: diminished breath sounds bibasilar Abdomen: soft, non-tender; bowel sounds normal; no masses,  no organomegaly Extremities: extremities normal, atraumatic, no cyanosis or edema and Homans sign is negative, no sign of DVT Wound: Sternum stable  Lab Results: CBC: Recent Labs    04/18/18 1615 04/19/18  0409  WBC 9.0 9.8  HGB 8.5* 8.1*  HCT 26.7* 25.9*  PLT 139* 131*   BMET:  Recent Labs    04/18/18 0345  04/18/18 1531 04/18/18 2300 04/19/18 0409  NA 136   < > 134*  --  135  K 4.3   < > 4.2  --  3.9  CL 105   < > 101  --  103  CO2 22  --   --   --  23  GLUCOSE 124*   < > 110*  --  89  BUN 9   < > 8  --  8  CREATININE 0.62   < > 0.40* 0.67 0.69  CALCIUM 7.8*  --   --   --  8.1*   < > = values in this interval not displayed.    CMET: Lab Results  Component Value Date   WBC 9.8 04/19/2018   HGB 8.1 (L) 04/19/2018   HCT 25.9 (L) 04/19/2018   PLT 131 (L) 04/19/2018   GLUCOSE 89  04/19/2018   CHOL 215 (H) 04/16/2018   TRIG 69 04/16/2018   HDL 72 04/16/2018   LDLCALC 129 (H) 04/16/2018   ALT 17 04/16/2018   AST 29 04/16/2018   NA 135 04/19/2018   K 3.9 04/19/2018   CL 103 04/19/2018   CREATININE 0.69 04/19/2018   BUN 8 04/19/2018   CO2 23 04/19/2018   TSH 1.43 04/15/2018   INR 1.25 04/17/2018   HGBA1C 5.3 04/16/2018      PT/INR:  Recent Labs    04/17/18 1504  LABPROT 15.6*  INR 1.25   Radiology: No results found.   Assessment/Plan: S/P Procedure(s) (LRB): CORONARY ARTERY BYPASS GRAFTING (CABG) TIMES USING LEFT INTERNAL MAMMARY ARTERY TO LAD AND SAPHENOUS VEIN TO DIAG 1, OM1, AND RCA (N/A) TRANSESOPHAGEAL ECHOCARDIOGRAM (TEE) (N/A) ENDOVEIN HARVEST OF GREATER SAPHENOUS VEIN RIGHT THIGH AND CALF (Right) Mobilize Diuresis DC A-line wean off neo- Limit solid food intake with nausea, add Reglan DC pleural tube Renal function is stable   Delight Ovens 04/19/2018 7:29 AM

## 2018-04-19 NOTE — Op Note (Signed)
[name], [name] MEDICAL RECORD ZO:[id number] ACCOUNT [id number] DATE OF BIRTH:Jul 25, 1950 FACILITY: [facility] LOCATION: [facility] PHYSICIAN:Kimberly Biever Bari EdwardB. Kimberly Wenzl, MD  OPERATIVE REPORT  DATE OF PROCEDURE:  04/17/2018  PREOPERATIVE DIAGNOSES:  Severe 3-vessel coronary artery disease with recent non-ST elevation myocardial infarction.  POSTOPERATIVE DIAGNOSIS:  Severe 3-vessel coronary artery disease with recent non-ST elevation myocardial infarction.  SURGICAL PROCEDURE:  Coronary artery bypass grafting x4 with the left internal mammary to the left anterior descending coronary artery, reverse saphenous vein graft to the first diagonal coronary artery, reverse saphenous vein graft to the first obtuse  marginal and reverse saphenous vein graft to the distal right coronary artery with right greater saphenous thigh and calf endoscopic vein harvesting.  SURGEON:  Kimberly PlaneEdward Maybell Misenheimer, MD  FIRST ASSISTANT:  Kimberly Fudgeonielle Zimmerman, PA.  BRIEF HISTORY:  The patient is a 67 year old female with no previous history of coronary artery disease who presents with new onset of prolonged chest pain and positive troponins consistent with non-STEMI myocardial infarction.  The patient was  stabilized medically and underwent cardiac catheterization demonstrating significant 3-vessel coronary artery disease with 60% left main obstruction with complex proximal LAD obstruction involving a moderate sized first diagonal, 99% proximal circumflex  obstruction, 60% proximal right obstruction.  Overall, ventricular function was preserved.  The patient did have slight systolic murmur and slight sclerosis of the aortic valve but without evidence of aortic stenosis.  Because of her significant left  main, LAD and circumflex disease, coronary artery bypass grafting was recommended to the patient who agreed and signed informed consent.  DESCRIPTION OF PROCEDURE:  With Swan-Ganz and arterial line monitors in place, the patient underwent  general endotracheal anesthesia without incident.  Skin of the chest and legs was prepped with Betadine, draped in the usual sterile manner.  Appropriate  timeout was performed and then we proceeded initially with harvesting of the right greater saphenous vein from the thigh and upper calf.  The vein was of good quality and caliber.  Median sternotomy was performed.  The left internal mammary artery was  dissected down as a pedicle graft.  The distal artery was divided and had excellent free flow.  Pericardium was opened.  Overall, ventricular function appeared preserved.  The patient was systemically heparinized.  The ascending aorta was cannulated.   The right atrium was cannulated.  An aortic root vent cardioplegia needle was introduced into the ascending aorta.  The patient was placed on cardiopulmonary bypass 2.4 liters per minute per meter square.  Sites of anastomoses were selected and dissected  at the epicardium should be noted the left anterior descending coronary artery was intramyocardial, but was located in its intramyocardial position.  The patient's body temperature was cooled to 32 degrees.  Aortic crossclamp was applied and 500 mL cold  blood potassium cardioplegia was administered with diastolic arrest of the heart.  Myocardial septal temperature was mild throughout the crossclamp.  Attention was turned first to the distal right coronary artery, which was opened and was a reasonable  sized vessel.  It admitted a 1.5 mm probe distally.  Using a running 7-0 Prolene, distal anastomosis was performed.  Attention was then turned to the lateral wall of the heart.  The first obtuse marginal, which was the largest supplier of the lateral  wall, was partially intramyocardial.  This vessel was opened and admitted 1.5 mm probe distally.  Using a running 7-0 Prolene, distal anastomosis was performed with a segment of reverse saphenous vein graft.  Attention was then turned to the first  diagonal  coronary artery, which was of similar size.  The vessel was opened and a 1.5 mm probe passed distally.  Using a running 7-0 Prolene, distal anastomosis was performed.  Attention was then turned to the left anterior descending coronary artery  where it was has noted it was found in its intramyocardial position.  The vessel was opened and admitted a 1.5 mm probe distally.  Using a running 8-0 Prolene, the left internal mammary artery was anastomosed to the left anterior descending coronary  artery.  With crossclamp still in place, 3 punch aortotomies were performed and each of the 3 vein grafts were anastomosed to the ascending aorta.  The bulldog was removed from the mammary artery with rise in myocardial septal temperature.  Aortic  crossclamp was removed after the heart was allowed to passively fill and deair and the proximal anastomoses were completed.  Total crossclamp time was 101 minutes.  The patient spontaneously converted to a sinus rhythm.  Sites and anastomoses were  inspected and free of bleeding.  The patient's body temperature was rewarmed to 37 degrees.  Atrial an0d ventricular pacing wires were applied.  Graft markers were applied.  The patient was weaned from cardiopulmonary bypass without difficulty,  decannulated in the usual fashion.  Protamine sulfate was administered.  Atrial and ventricular pacing wires were applied.  Graft markers were applied.  A left pleural tube and Blake mediastinal drain were left in place.  The pericardium was loosely  reapproximated.  Sternum was closed with #6 stainless steel wire.  Fascia closed with interrupted 0 Vicryl, running 3-0 Vicryl in subcutaneous tissue, 3-0 subcuticular stitch in the skin edges.  Dry dressings were applied.  Sponge and needle count was  reported as correct at completion of procedure.  RF lap detection system was used and registered clear.  Total pump time was 135 minutes.  The patient was transported to the surgical intensive care  unit for further postoperative care, having tolerated  the procedure without obvious complication.  TN/NUANCE  D:04/18/2018 T:04/19/2018 JOB:[id number]

## 2018-04-19 NOTE — Progress Notes (Signed)
CSW acknowledging consult for "Advance directives". Inappropriate consult; CSW unable to assist with advance directives. Please consult spiritual care to assist patient with advance directives.  Blenda NicelyElizabeth Tryphena Perkovich, Kentucky[place]CS[place] Clinical Social Worker [phone]

## 2018-04-20 ENCOUNTER — Inpatient Hospital Stay (HOSPITAL_COMMUNITY): Payer: Medicare Other

## 2018-04-20 LAB — GLUCOSE, CAPILLARY
GLUCOSE-CAPILLARY: 83 mg/dL (ref 70–99)
Glucose-Capillary: 74 mg/dL (ref 70–99)
Glucose-Capillary: 73 mg/dL (ref 70–99)
Glucose-Capillary: 69 mg/dL — ABNORMAL LOW (ref 70–99)
Glucose-Capillary: 74 mg/dL (ref 70–99)
Glucose-Capillary: 80 mg/dL (ref 70–99)

## 2018-04-20 LAB — BASIC METABOLIC PANEL
Anion gap: 10 (ref 5–15)
BUN: 6 mg/dL — ABNORMAL LOW (ref 8–23)
CO2: 24 mmol/L (ref 22–32)
Calcium: 7.8 mg/dL — ABNORMAL LOW (ref 8.9–10.3)
Chloride: 104 mmol/L (ref 98–111)
Creatinine, Ser: 0.74 mg/dL (ref 0.44–1.00)
GFR calc Af Amer: >60 mL/min (ref >60)
GFR calc non Af Amer: >60 mL/min (ref >60)
Glucose, Bld: 78 mg/dL (ref 70–99)
Potassium: 3.3 mmol/L — ABNORMAL LOW (ref 3.5–5.1)
Sodium: 138 mmol/L (ref 135–145)

## 2018-04-20 LAB — CBC
HCT: 24.2 % — ABNORMAL LOW (ref 36.0–46.0)
Hemoglobin: 7.6 g/dL — ABNORMAL LOW (ref 12.0–15.0)
MCH: 32.1 pg (ref 26.0–34.0)
MCHC: 31.4 g/dL (ref 30.0–36.0)
MCV: 102.1 fL — ABNORMAL HIGH (ref 80.0–100.0)
Platelets: 148 10*3/uL — ABNORMAL LOW (ref 150–400)
RBC: 2.37 MIL/uL — ABNORMAL LOW (ref 3.87–5.11)
RDW: 12.1 % (ref 11.5–15.5)
WBC: 7.5 10*3/uL (ref 4.0–10.5)
nRBC: 0.3 % — ABNORMAL HIGH (ref 0.0–0.2)

## 2018-04-20 LAB — PREPARE RBC (CROSSMATCH)

## 2018-04-20 MED ORDER — INSULIN ASPART 100 UNIT/ML ~~LOC~~ SOLN: 0.0000 [IU] | Freq: Three times a day (TID) | SUBCUTANEOUS | Status: DC

## 2018-04-20 MED ORDER — POTASSIUM CHLORIDE 10 MEQ/50ML IV SOLN
10.0000 meq | INTRAVENOUS | Status: AC
  Administered 2018-04-20 (×3): 10 meq via INTRAVENOUS
  Filled 2018-04-20 (×4): qty 50

## 2018-04-20 MED ORDER — POTASSIUM CHLORIDE 10 MEQ/50ML IV SOLN
10.0000 meq | INTRAVENOUS | Status: AC
  Administered 2018-04-20 (×2): 10 meq via INTRAVENOUS
  Filled 2018-04-20: qty 50

## 2018-04-20 MED ORDER — SODIUM CHLORIDE 0.9% IV SOLUTION
Freq: Once | INTRAVENOUS | Status: AC
  Administered 2018-04-20: 13:00:00 via INTRAVENOUS

## 2018-04-20 MED ORDER — FUROSEMIDE 10 MG/ML IJ SOLN
40.0000 mg | Freq: Once | INTRAMUSCULAR | Status: AC
  Administered 2018-04-20: 40 mg via INTRAVENOUS
  Filled 2018-04-20: qty 4

## 2018-04-20 NOTE — Plan of Care (Signed)

## 2018-04-20 NOTE — Progress Notes (Signed)
      [address]       [place] [id number]             [phone]      Feels better posttransfusion  BP 96/84   Pulse 100   Temp 99.6 F (37.6 C) (Oral)   Resp (!) 24   Ht 5' (1.524 m)   Wt 76.3 kg   SpO2 93%   BMI 32.85 kg/m   Intake/Output Summary (Last 24 hours) at 04/20/2018 1716 Last data filed at 04/20/2018 1711 Gross per 24 hour  Intake 1148.9 ml  Output 3650 ml  Net -2501.1 ml   Still on a small dose of neo- continue to wean  Viviann SpareSteven C. Dorris FetchHendrickson, MD [facility] [phone]

## 2018-04-20 NOTE — Progress Notes (Signed)
3 Days Post-Op Procedure(s) (LRB): CORONARY ARTERY BYPASS GRAFTING (CABG) TIMES USING LEFT INTERNAL MAMMARY ARTERY TO LAD AND SAPHENOUS VEIN TO DIAG 1, OM1, AND RCA (N/A) TRANSESOPHAGEAL ECHOCARDIOGRAM (TEE) (N/A) ENDOVEIN HARVEST OF GREATER SAPHENOUS VEIN RIGHT THIGH AND CALF (Right) Subjective: Feels better today, nausea resolved after tramadol stopped  Objective: Vital signs in last 24 hours: Temp:  [98 F (36.7 C)-99.2 F (37.3 C)] 98.3 F (36.8 C) (12/07 0801) Pulse Rate:  [79-103] 87 (12/07 0700) Cardiac Rhythm: Normal sinus rhythm (12/07 0300) Resp:  [15-27] 19 (12/07 0700) BP: (61-136)/(45-66) 100/57 (12/07 0700) SpO2:  [95 %-100 %] 100 % (12/07 0700) Arterial Line BP: (93-109)/(46-56) 102/48 (12/06 1200) Weight:  [76.3 kg] 76.3 kg (12/07 0500)  Hemodynamic parameters for last 24 hours:    Intake/Output from previous day: 12/06 0701 - 12/07 0700 In: 264.4 [P.O.:100; I.V.:156.3; IV Piggyback:8.1] Out: 2225 [Urine:2225] Intake/Output this shift: No intake/output data recorded.  General appearance: alert, cooperative and no distress Neurologic: intact Heart: regular rate and rhythm Lungs: diminished breath sounds bibasilar Abdomen: normal findings: soft, non-tender  Lab Results: Recent Labs    04/19/18 0409 04/20/18 0425  WBC 9.8 7.5  HGB 8.1* 7.6*  HCT 25.9* 24.2*  PLT 131* 148*   BMET:  Recent Labs    04/19/18 0409 04/20/18 0425  NA 135 138  K 3.9 3.3*  CL 103 104  CO2 23 24  GLUCOSE 89 78  BUN 8 6*  CREATININE 0.69 0.74  CALCIUM 8.1* 7.8*    PT/INR:  Recent Labs    04/17/18 1504  LABPROT 15.6*  INR 1.25   ABG    Component Value Date/Time   PHART 7.353 04/17/2018 2202   HCO3 22.3 04/17/2018 2202   TCO2 24 04/18/2018 1531   ACIDBASEDEF 3.0 (H) 04/17/2018 2202   O2SAT 98.0 04/17/2018 2202   CBG (last 3)  Recent Labs    04/19/18 2344 04/20/18 0404 04/20/18 0751  GLUCAP 74 83 73    Assessment/Plan: S/P Procedure(s)  (LRB): CORONARY ARTERY BYPASS GRAFTING (CABG) TIMES USING LEFT INTERNAL MAMMARY ARTERY TO LAD AND SAPHENOUS VEIN TO DIAG 1, OM1, AND RCA (N/A) TRANSESOPHAGEAL ECHOCARDIOGRAM (TEE) (N/A) ENDOVEIN HARVEST OF GREATER SAPHENOUS VEIN RIGHT THIGH AND CALF (Right) - POD # 2 CABG CV- still requiring neo drip for BP-  RESP- IS for atelectasis RENAL- diuresed well yesterday  Hypokalemia- supplement ENDO- CBG well controlled Anemia secondary to ABL, symptomatic and requiring Neo- will transfuse Cardiac rehab     LOS: 5 days    Loreli SlotSteven C Clessie Karras 04/20/2018

## 2018-04-21 LAB — BPAM RBC
Blood Product Expiration Date: 202001022359
ISSUE DATE / TIME: 201912071227
Unit Type and Rh: 6200

## 2018-04-21 LAB — CBC
HCT: 28.7 % — ABNORMAL LOW (ref 36.0–46.0)
Hemoglobin: 9.3 g/dL — ABNORMAL LOW (ref 12.0–15.0)
MCH: 31.5 pg (ref 26.0–34.0)
MCHC: 32.4 g/dL (ref 30.0–36.0)
MCV: 97.3 fL (ref 80.0–100.0)
PLATELETS: 198 10*3/uL (ref 150–400)
RBC: 2.95 MIL/uL — ABNORMAL LOW (ref 3.87–5.11)
RDW: 13.2 % (ref 11.5–15.5)
WBC: 6.4 10*3/uL (ref 4.0–10.5)
nRBC: 0 % (ref 0.0–0.2)

## 2018-04-21 LAB — BASIC METABOLIC PANEL
Anion gap: 10 (ref 5–15)
BUN: <5 mg/dL — ABNORMAL LOW (ref 8–23)
CO2: 27 mmol/L (ref 22–32)
CREATININE: 0.5 mg/dL (ref 0.44–1.00)
Calcium: 8.3 mg/dL — ABNORMAL LOW (ref 8.9–10.3)
Chloride: 101 mmol/L (ref 98–111)
GFR calc Af Amer: >60 mL/min (ref >60)
GFR calc non Af Amer: >60 mL/min (ref >60)
Glucose, Bld: 90 mg/dL (ref 70–99)
Potassium: 3.5 mmol/L (ref 3.5–5.1)
Sodium: 138 mmol/L (ref 135–145)

## 2018-04-21 LAB — TYPE AND SCREEN
ABO/RH(D): A POS
Antibody Screen: NEGATIVE
Unit division: 0

## 2018-04-21 LAB — GLUCOSE, CAPILLARY: Glucose-Capillary: 79 mg/dL (ref 70–99)

## 2018-04-21 MED ORDER — POTASSIUM CHLORIDE CRYS ER 20 MEQ PO TBCR
40.0000 meq | EXTENDED_RELEASE_TABLET | Freq: Two times a day (BID) | ORAL | Status: DC
  Administered 2018-04-21 – 2018-04-22 (×3): 40 meq via ORAL
  Filled 2018-04-21 (×4): qty 2

## 2018-04-21 MED ORDER — POTASSIUM CHLORIDE CRYS ER 20 MEQ PO TBCR: 30.0000 meq | EXTENDED_RELEASE_TABLET | Freq: Two times a day (BID) | ORAL | Status: DC

## 2018-04-21 MED ORDER — POTASSIUM CHLORIDE CRYS ER 20 MEQ PO TBCR
20.0000 meq | EXTENDED_RELEASE_TABLET | ORAL | Status: DC | PRN
  Administered 2018-04-21: 20 meq via ORAL
  Filled 2018-04-21: qty 1

## 2018-04-21 MED ORDER — POTASSIUM CHLORIDE 10 MEQ/50ML IV SOLN
10.0000 meq | INTRAVENOUS | Status: DC
  Filled 2018-04-21: qty 50

## 2018-04-21 NOTE — Progress Notes (Signed)
      [address]       [place] [id number]             [phone]      Stable day BP 128/74   Pulse 87   Temp 98.6 F (37 C) (Oral)   Resp 20   Ht 5' (1.524 m)   Wt 73.6 kg   SpO2 97%   BMI 31.68 kg/m   Intake/Output Summary (Last 24 hours) at 04/21/2018 1821 Last data filed at 04/21/2018 0500 Gross per 24 hour  Intake 440 ml  Output 900 ml  Net -460 ml   Doing well  Viviann SpareSteven C. Dorris FetchHendrickson, MD [facility] [phone]

## 2018-04-21 NOTE — Progress Notes (Signed)
4 Days Post-Op Procedure(s) (LRB): CORONARY ARTERY BYPASS GRAFTING (CABG) TIMES USING LEFT INTERNAL MAMMARY ARTERY TO LAD AND SAPHENOUS VEIN TO DIAG 1, OM1, AND RCA (N/A) TRANSESOPHAGEAL ECHOCARDIOGRAM (TEE) (N/A) ENDOVEIN HARVEST OF GREATER SAPHENOUS VEIN RIGHT THIGH AND CALF (Right) Subjective: Pain and nausea better, appetite still subpar  Objective: Vital signs in last 24 hours: Temp:  [98.2 F (36.8 C)-99.6 F (37.6 C)] 99.6 F (37.6 C) (12/08 0700) Pulse Rate:  [25-106] 88 (12/08 0700) Cardiac Rhythm: Normal sinus rhythm (12/08 0500) Resp:  [12-30] 18 (12/08 0700) BP: (74-141)/(50-84) 88/52 (12/08 0630) SpO2:  [87 %-100 %] 96 % (12/08 0700) Weight:  [73.6 kg] 73.6 kg (12/08 0500)  Hemodynamic parameters for last 24 hours:    Intake/Output from previous day: 12/07 0701 - 12/08 0700 In: 1754.8 [P.O.:1080; I.V.:145.6; Blood:310; IV Piggyback:219.3] Out: 3750 [Urine:3750] Intake/Output this shift: No intake/output data recorded.  General appearance: alert, cooperative and no distress Neurologic: intact Heart: slightly irregular Lungs: diminished breath sounds bibasilar Abdomen: normal findings: soft, non-tender  Lab Results: Recent Labs    04/20/18 0425 04/21/18 0406  WBC 7.5 6.4  HGB 7.6* 9.3*  HCT 24.2* 28.7*  PLT 148* 198   BMET:  Recent Labs    04/20/18 0425 04/21/18 0406  NA 138 138  K 3.3* 3.5  CL 104 101  CO2 24 27  GLUCOSE 78 90  BUN 6* <5*  CREATININE 0.74 0.50  CALCIUM 7.8* 8.3*    PT/INR: No results for input(s): LABPROT, INR in the last 72 hours. ABG    Component Value Date/Time   PHART 7.353 04/17/2018 2202   HCO3 22.3 04/17/2018 2202   TCO2 24 04/18/2018 1531   ACIDBASEDEF 3.0 (H) 04/17/2018 2202   O2SAT 98.0 04/17/2018 2202   CBG (last 3)  Recent Labs    04/20/18 1519 04/20/18 1958 04/21/18 0757  GLUCAP 74 80 79    Assessment/Plan: S/P Procedure(s) (LRB): CORONARY ARTERY BYPASS GRAFTING (CABG) TIMES USING LEFT INTERNAL  MAMMARY ARTERY TO LAD AND SAPHENOUS VEIN TO DIAG 1, OM1, AND RCA (N/A) TRANSESOPHAGEAL ECHOCARDIOGRAM (TEE) (N/A) ENDOVEIN HARVEST OF GREATER SAPHENOUS VEIN RIGHT THIGH AND CALF (Right) -  CV- BP relatively labile, may be intravascularly dry after diuresing yesterday  Will hold off on further diuresis  In SR with frequent PACs  RESP- continue IS for atelectasis  RENAL- creatinine normal, 2L negative with diuresis yesterday  Will give IV KCL for hypokalemia  ENDO- CBG normal, dc SSI  SCD + enoxaparin + ambulation   LOS: 6 days    Loreli SlotSteven C Bahar Shelden 04/21/2018

## 2018-04-22 DIAGNOSIS — I25810 Atherosclerosis of coronary artery bypass graft(s) without angina pectoris: Secondary | ICD-10-CM

## 2018-04-22 LAB — BASIC METABOLIC PANEL
Anion gap: 6 (ref 5–15)
BUN: <5 mg/dL — ABNORMAL LOW (ref 8–23)
CHLORIDE: 105 mmol/L (ref 98–111)
CO2: 29 mmol/L (ref 22–32)
Calcium: 8.6 mg/dL — ABNORMAL LOW (ref 8.9–10.3)
Creatinine, Ser: 0.66 mg/dL (ref 0.44–1.00)
GFR calc Af Amer: >60 mL/min (ref >60)
GFR calc non Af Amer: >60 mL/min (ref >60)
Glucose, Bld: 86 mg/dL (ref 70–99)
Potassium: 4.2 mmol/L (ref 3.5–5.1)
Sodium: 140 mmol/L (ref 135–145)

## 2018-04-22 LAB — CBC
HCT: 31.8 % — ABNORMAL LOW (ref 36.0–46.0)
Hemoglobin: 10.1 g/dL — ABNORMAL LOW (ref 12.0–15.0)
MCH: 31.2 pg (ref 26.0–34.0)
MCHC: 31.8 g/dL (ref 30.0–36.0)
MCV: 98.1 fL (ref 80.0–100.0)
Platelets: 266 10*3/uL (ref 150–400)
RBC: 3.24 MIL/uL — ABNORMAL LOW (ref 3.87–5.11)
RDW: 13 % (ref 11.5–15.5)
WBC: 6.1 10*3/uL (ref 4.0–10.5)
nRBC: 0 % (ref 0.0–0.2)

## 2018-04-22 MED ORDER — SODIUM CHLORIDE 0.9% FLUSH: 3.0000 mL | INTRAVENOUS | Status: DC | PRN

## 2018-04-22 MED ORDER — TRAMADOL HCL 50 MG PO TABS: 50.0000 mg | ORAL_TABLET | Freq: Four times a day (QID) | ORAL | Status: DC | PRN

## 2018-04-22 MED ORDER — INSULIN ASPART 100 UNIT/ML ~~LOC~~ SOLN: 0.0000 [IU] | Freq: Three times a day (TID) | SUBCUTANEOUS | Status: DC

## 2018-04-22 MED ORDER — BISACODYL 10 MG RE SUPP: 10.0000 mg | Freq: Every day | RECTAL | Status: DC | PRN

## 2018-04-22 MED ORDER — MOVING RIGHT ALONG BOOK
Freq: Once | Status: AC
  Administered 2018-04-23
  Filled 2018-04-22: qty 1

## 2018-04-22 MED ORDER — ASPIRIN EC 325 MG PO TBEC
325.0000 mg | DELAYED_RELEASE_TABLET | Freq: Every day | ORAL | Status: DC
  Administered 2018-04-23: 325 mg via ORAL
  Filled 2018-04-22: qty 1

## 2018-04-22 MED ORDER — BISACODYL 5 MG PO TBEC: 10.0000 mg | DELAYED_RELEASE_TABLET | Freq: Every day | ORAL | Status: DC | PRN

## 2018-04-22 MED ORDER — ACETAMINOPHEN 325 MG PO TABS
650.0000 mg | ORAL_TABLET | Freq: Four times a day (QID) | ORAL | Status: DC | PRN
  Administered 2018-04-23 (×2): 650 mg via ORAL
  Filled 2018-04-22 (×2): qty 2

## 2018-04-22 MED ORDER — SODIUM CHLORIDE 0.9% FLUSH
3.0000 mL | Freq: Two times a day (BID) | INTRAVENOUS | Status: DC
  Administered 2018-04-22 – 2018-04-23 (×2): 3 mL via INTRAVENOUS

## 2018-04-22 MED ORDER — ONDANSETRON HCL 4 MG/2ML IJ SOLN: 4.0000 mg | Freq: Four times a day (QID) | INTRAMUSCULAR | Status: DC | PRN

## 2018-04-22 MED ORDER — ONDANSETRON HCL 4 MG PO TABS: 4.0000 mg | ORAL_TABLET | Freq: Four times a day (QID) | ORAL | Status: DC | PRN

## 2018-04-22 MED ORDER — GUAIFENESIN ER 600 MG PO TB12: 600.0000 mg | ORAL_TABLET | Freq: Two times a day (BID) | ORAL | Status: DC | PRN

## 2018-04-22 MED ORDER — SODIUM CHLORIDE 0.9 % IV SOLN: 250.0000 mL | INTRAVENOUS | Status: DC | PRN

## 2018-04-22 MED ORDER — METOPROLOL TARTRATE 12.5 MG HALF TABLET
12.5000 mg | ORAL_TABLET | Freq: Two times a day (BID) | ORAL | Status: DC
  Administered 2018-04-23: 12.5 mg via ORAL
  Filled 2018-04-22: qty 1

## 2018-04-22 MED FILL — Sodium Chloride IV Soln 0.9%: INTRAVENOUS | Qty: 2000 | Status: AC

## 2018-04-22 MED FILL — Lidocaine HCl(Cardiac) IV PF Soln Pref Syr 100 MG/5ML (2%): INTRAVENOUS | Qty: 5 | Status: AC

## 2018-04-22 MED FILL — Electrolyte-R (PH 7.4) Solution: INTRAVENOUS | Qty: 3000 | Status: AC

## 2018-04-22 MED FILL — Mannitol IV Soln 20%: INTRAVENOUS | Qty: 500 | Status: AC

## 2018-04-22 MED FILL — Heparin Sodium (Porcine) Inj 1000 Unit/ML: INTRAMUSCULAR | Qty: 60 | Status: AC

## 2018-04-22 MED FILL — Sodium Bicarbonate IV Soln 8.4%: INTRAVENOUS | Qty: 50 | Status: AC

## 2018-04-22 NOTE — Progress Notes (Signed)
Patient ID: [name], female   DOB: [birth year], 67 y.o.   MRN: [id number] TCTS DAILY ICU PROGRESS NOTE                   [address]            [place] [address]          [phone]   5 Days Post-Op Procedure(s) (LRB): CORONARY ARTERY BYPASS GRAFTING (CABG) TIMES USING LEFT INTERNAL MAMMARY ARTERY TO LAD AND SAPHENOUS VEIN TO DIAG 1, OM1, AND RCA (N/A) TRANSESOPHAGEAL ECHOCARDIOGRAM (TEE) (N/A) ENDOVEIN HARVEST OF GREATER SAPHENOUS VEIN RIGHT THIGH AND CALF (Right)  Total Length of Stay:  LOS: 7 days   Subjective: Patient feels better this morning blood pressure stable, ambulating well Off neo-  Objective: Vital signs in last 24 hours: Temp:  [98 F (36.7 C)-98.8 F (37.1 C)] 98.8 F (37.1 C) (12/09 0809) Pulse Rate:  [70-107] 91 (12/09 0800) Cardiac Rhythm: Normal sinus rhythm (12/09 0800) Resp:  [15-27] 16 (12/09 0800) BP: (71-136)/(19-92) 128/58 (12/09 0800) SpO2:  [92 %-99 %] 96 % (12/09 0800) Weight:  [72.9 kg] 72.9 kg (12/09 0500)  Filed Weights   04/20/18 0500 04/21/18 0500 04/22/18 0500  Weight: 76.3 kg 73.6 kg 72.9 kg    Weight change: -0.673 kg   Hemodynamic parameters for last 24 hours:    Intake/Output from previous day: 12/08 0701 - 12/09 0700 In: 600 [P.O.:600] Out: -   Intake/Output this shift: No intake/output data recorded.  Current Meds: Scheduled Meds: . acetaminophen  1,000 mg Oral Q6H   Or  . acetaminophen (TYLENOL) oral liquid 160 mg/5 mL  1,000 mg Per Tube Q6H  . acyclovir  400 mg Oral BID  . aspirin EC  325 mg Oral Daily   Or  . aspirin  324 mg Per Tube Daily  . atorvastatin  80 mg Oral q1800  . Chlorhexidine Gluconate Cloth  6 each Topical Daily  . docusate sodium  200 mg Oral Daily  . enoxaparin (LOVENOX) injection  40 mg Subcutaneous QHS  . levothyroxine  88 mcg Oral QAC breakfast  . mouth rinse  15 mL Mouth Rinse BID  . metoprolol tartrate  12.5 mg Oral BID   Or  . metoprolol tartrate  12.5 mg Per  Tube BID  . pantoprazole  40 mg Oral Daily  . potassium chloride  40 mEq Oral BID  . sodium chloride flush  10-40 mL Intracatheter Q12H   Continuous Infusions: . sodium chloride Stopped (04/18/18 0522)  . lactated ringers Stopped (04/20/18 0753)  . lactated ringers 20 mL/hr at 04/18/18 0500  . phenylephrine (NEO-SYNEPHRINE) Adult infusion 3.9 mcg/min (04/20/18 1700)   PRN Meds:.fentaNYL (SUBLIMAZE) injection, metoprolol tartrate, ondansetron (ZOFRAN) IV, oxyCODONE, sodium chloride flush  General appearance: alert, cooperative and appears stated age Neurologic: intact Heart: regular rate and rhythm, S1, S2 normal, no murmur, click, rub or gallop Lungs: diminished breath sounds bibasilar Abdomen: soft, non-tender; bowel sounds normal; no masses,  no organomegaly Extremities: extremities normal, atraumatic, no cyanosis or edema and Homans sign is negative, no sign of DVT Wound: Sternum stable wound healing well  Lab Results: CBC: Recent Labs    04/21/18 0406 04/22/18 0321  WBC 6.4 6.1  HGB 9.3* 10.1*  HCT 28.7* 31.8*  PLT 198 266   BMET:  Recent Labs    04/21/18 0406 04/22/18 0321  NA 138 140  K 3.5 4.2  CL 101 105  CO2 27 29  GLUCOSE  90 86  BUN <5* <5*  CREATININE 0.50 0.66  CALCIUM 8.3* 8.6*    CMET: Lab Results  Component Value Date   WBC 6.1 04/22/2018   HGB 10.1 (L) 04/22/2018   HCT 31.8 (L) 04/22/2018   PLT 266 04/22/2018   GLUCOSE 86 04/22/2018   CHOL 215 (H) 04/16/2018   TRIG 69 04/16/2018   HDL 72 04/16/2018   LDLCALC 129 (H) 04/16/2018   ALT 17 04/16/2018   AST 29 04/16/2018   NA 140 04/22/2018   K 4.2 04/22/2018   CL 105 04/22/2018   CREATININE 0.66 04/22/2018   BUN <5 (L) 04/22/2018   CO2 29 04/22/2018   TSH 1.43 04/15/2018   INR 1.25 04/17/2018   HGBA1C 5.3 04/16/2018      PT/INR: No results for input(s): LABPROT, INR in the last 72 hours. Radiology: No results found.   Assessment/Plan: S/P Procedure(s) (LRB): CORONARY ARTERY  BYPASS GRAFTING (CABG) TIMES USING LEFT INTERNAL MAMMARY ARTERY TO LAD AND SAPHENOUS VEIN TO DIAG 1, OM1, AND RCA (N/A) TRANSESOPHAGEAL ECHOCARDIOGRAM (TEE) (N/A) ENDOVEIN HARVEST OF GREATER SAPHENOUS VEIN RIGHT THIGH AND CALF (Right) Mobilize Diabetes control d/c pacing wires Plan for transfer to step-down: see transfer orders Possible home tomorrow    Delight Ovensdward B Avedis Bevis 04/22/2018 8:28 AM

## 2018-04-22 NOTE — Progress Notes (Signed)
CT surgery p.m. Rounds  Patient had excellent day Ambulating in hall Epicardial wires removed Containing sinus rhythm Waiting for transfer out of ICU

## 2018-04-22 NOTE — Plan of Care (Signed)
Fentanyl 25 mcg IV given by Philis PiqueEryka M RN at 12/5 1030 for chest tube removal.

## 2018-04-22 NOTE — Progress Notes (Signed)
   Stable without angina.  Progressing nicely.

## 2018-04-22 NOTE — Plan of Care (Signed)
Late entry  Fentanyl 25 mcg IV given 12/5@1030  for chest tube removal by Philis PiqueEryka M RN

## 2018-04-22 NOTE — Progress Notes (Signed)
RT Note:  Went to check on patient per order.  Patient breath sounds were Clear, Diminished in bases.  Patient was not complaining of  SOB, and was on room air with an O2 sat of 94 to 97%.  Patient stated she has not been wearing her O2 since yesterday and has been doing fine.  Patient appears to be progressing well.

## 2018-04-23 ENCOUNTER — Inpatient Hospital Stay (HOSPITAL_COMMUNITY): Payer: Medicare Other

## 2018-04-23 LAB — CBC
HCT: 34.6 % — ABNORMAL LOW (ref 36.0–46.0)
Hemoglobin: 11.5 g/dL — ABNORMAL LOW (ref 12.0–15.0)
MCH: 31.8 pg (ref 26.0–34.0)
MCHC: 33.2 g/dL (ref 30.0–36.0)
MCV: 95.6 fL (ref 80.0–100.0)
Platelets: 329 10*3/uL (ref 150–400)
RBC: 3.62 MIL/uL — ABNORMAL LOW (ref 3.87–5.11)
RDW: 12.6 % (ref 11.5–15.5)
WBC: 6.9 10*3/uL (ref 4.0–10.5)
nRBC: 0 % (ref 0.0–0.2)

## 2018-04-23 LAB — BASIC METABOLIC PANEL
Anion gap: 9 (ref 5–15)
BUN: <5 mg/dL — ABNORMAL LOW (ref 8–23)
CO2: 26 mmol/L (ref 22–32)
Calcium: 9.3 mg/dL (ref 8.9–10.3)
Chloride: 104 mmol/L (ref 98–111)
Creatinine, Ser: 0.74 mg/dL (ref 0.44–1.00)
GFR calc Af Amer: >60 mL/min (ref >60)
GFR calc non Af Amer: >60 mL/min (ref >60)
Glucose, Bld: 105 mg/dL — ABNORMAL HIGH (ref 70–99)
Potassium: 4.5 mmol/L (ref 3.5–5.1)
Sodium: 139 mmol/L (ref 135–145)

## 2018-04-23 MED ORDER — GUAIFENESIN ER 600 MG PO TB12: 600.0000 mg | ORAL_TABLET | Freq: Two times a day (BID) | ORAL | Status: DC | PRN

## 2018-04-23 MED ORDER — ASPIRIN 325 MG PO TBEC: 325.0000 mg | DELAYED_RELEASE_TABLET | Freq: Four times a day (QID) | ORAL | Status: DC | PRN

## 2018-04-23 MED ORDER — ACETAMINOPHEN 325 MG PO TABS: 650.0000 mg | ORAL_TABLET | Freq: Four times a day (QID) | ORAL | Status: DC | PRN

## 2018-04-23 MED ORDER — METOPROLOL TARTRATE 25 MG PO TABS: 12.5000 mg | ORAL_TABLET | Freq: Two times a day (BID) | ORAL | 1 refills | Status: DC

## 2018-04-23 MED ORDER — ATORVASTATIN CALCIUM 80 MG PO TABS: 80.0000 mg | ORAL_TABLET | Freq: Every day | ORAL | 1 refills | Status: DC

## 2018-04-23 MED ORDER — TRAMADOL HCL 50 MG PO TABS: 50.0000 mg | ORAL_TABLET | Freq: Four times a day (QID) | ORAL | 0 refills | Status: DC | PRN

## 2018-04-23 NOTE — Discharge Instructions (Signed)
Endoscopic Saphenous Vein Harvesting, Care After °Refer to this sheet in the next few weeks. These instructions provide you with information about caring for yourself after your procedure. Your health care provider may also give you more specific instructions. Your treatment has been planned according to current medical practices, but problems sometimes occur. Call your health care provider if you have any problems or questions after your procedure. °What can I expect after the procedure? °After the procedure, it is common to have: °· Pain. °· Bruising. °· Swelling. °· Numbness. ° °Follow these instructions at home: °Medicine °· Take over-the-counter and prescription medicines only as told by your health care provider. °· Do not drive or operate heavy machinery while taking prescription pain medicine. °Incision care ° °· Follow instructions from your health care provider about how to take care of the cut made during surgery (incision). Make sure you: °? Wash your hands with soap and water before you change your bandage (dressing). If soap and water are not available, use hand sanitizer. °? Change your dressing as told by your health care provider. °? Leave stitches (sutures), skin glue, or adhesive strips in place. These skin closures may need to be in place for 2 weeks or longer. If adhesive strip edges start to loosen and curl up, you may trim the loose edges. Do not remove adhesive strips completely unless your health care provider tells you to do that. °· Check your incision area every day for signs of infection. Check for: °? More redness, swelling, or pain. °? More fluid or blood. °? Warmth. °? Pus or a bad smell. °General instructions °· Raise (elevate) your legs above the level of your heart while you are sitting or lying down. °· Do any exercises your health care providers have given you. These may include deep breathing, coughing, and walking exercises. °· Do not shower, take baths, swim, or use a hot tub  unless told by your health care provider. °· Wear your elastic stocking if told by your health care provider. °· Keep all follow-up visits as told by your health care provider. This is important. °Contact a health care provider if: °· Medicine does not help your pain. °· Your pain gets worse. °· You have new leg bruises or your leg bruises get bigger. °· You have a fever. °· Your leg feels numb. °· You have more redness, swelling, or pain around your incision. °· You have more fluid or blood coming from your incision. °· Your incision feels warm to the touch. °· You have pus or a bad smell coming from your incision. °Get help right away if: °· Your pain is severe. °· You develop pain, tenderness, warmth, redness, or swelling in any part of your leg. °· You have chest pain. °· You have trouble breathing. °This information is not intended to replace advice given to you by your health care provider. Make sure you discuss any questions you have with your health care provider. °Document Released: 01/11/2011 Document Revised: 10/07/2015 Document Reviewed: 03/15/2015 °Elsevier Interactive Patient Education © 2018 Elsevier Inc[employer] °Coronary Artery Bypass Grafting, Care After °These instructions give you information on caring for yourself after your procedure. Your doctor may also give you more specific instructions. Call your doctor if you have any problems or questions after your procedure. °Follow these instructions at home: °· Only take medicine as told by your doctor. Take medicines exactly as told. Do not stop taking medicines or start any new medicines without talking to your doctor first. °·   Take your pulse as told by your doctor. °· Do deep breathing as told by your doctor. Use your breathing device (incentive spirometer), if given, to practice deep breathing several times a day. Support your chest with a pillow or your arms when you take deep breaths or cough. °· Keep the area clean, dry, and protected where the  surgery cuts (incisions) were made. Remove bandages (dressings) only as told by your doctor. If strips were applied to surgical area, do not take them off. They fall off on their own. °· Check the surgery area daily for puffiness (swelling), redness, or leaking fluid. °· If surgery cuts were made in your legs: °? Avoid crossing your legs. °? Avoid sitting for long periods of time. Change positions every 30 minutes. °? Raise your legs when you are sitting. Place them on pillows. °· Wear stockings that help keep blood clots from forming in your legs (compression stockings). °· Only take sponge baths until your doctor says it is okay to take showers. Pat the surgery area dry. Do not rub the surgery area with a washcloth or towel. Do not bathe, swim, or use a hot tub until your doctor says it is okay. °· Eat foods that are high in fiber. These include raw fruits and vegetables, whole grains, beans, and nuts. Choose lean meats. Avoid canned, processed, and fried foods. °· Drink enough fluids to keep your pee (urine) clear or pale yellow. °· Weigh yourself every day. °· Rest and limit activity as told by your doctor. You may be told to: °? Stop any activity if you have chest pain, shortness of breath, changes in heartbeat, or dizziness. Get help right away if this happens. °? Move around often for short amounts of time or take short walks as told by your doctor. Gradually become more active. You may need help to strengthen your muscles and build endurance. °? Avoid lifting, pushing, or pulling anything heavier than 10 pounds (4.5 kg) for at least 6 weeks after surgery. °· Do not drive until your doctor says it is okay. °· Ask your doctor when you can go back to work. °· Ask your doctor when you can begin sexual activity again. °· Follow up with your doctor as told. °Contact a doctor if: °· You have puffiness, redness, more pain, or fluid draining from the incision site. °· You have a fever. °· You have puffiness in your  ankles or legs. °· You have pain in your legs. °· You gain 2 or more pounds (0.9 kg) a day. °· You feel sick to your stomach (nauseous) or throw up (vomit). °· You have watery poop (diarrhea). °Get help right away if: °· You have chest pain that goes to your jaw or arms. °· You have shortness of breath. °· You have a fast or irregular heartbeat. °· You notice a "clicking" in your breastbone when you move. °· You have numbness or weakness in your arms or legs. °· You feel dizzy or light-headed. °This information is not intended to replace advice given to you by your health care provider. Make sure you discuss any questions you have with your health care provider. °Document Released: 05/06/2013 Document Revised: 10/07/2015 Document Reviewed: 10/08/2012 °Elsevier Interactive Patient Education © 2017 Elsevier Inc. ° °

## 2018-04-23 NOTE — Progress Notes (Signed)
CARDIOLOGY RECOMMENDATIONS:  Discharge is anticipated in the next 48 hours. Recommendations for medications and follow up:  Discharge Medications: [facility]. Exceptions to the above:  None. Assume ASA, Metoprolol, and high intensity statin.  Follow Up: The patient's Primary Cardiologist is Chilton Siiffany [place], MD   Follow up in the office in 2-3 week(s).  Signed,  Lesleigh NoeHenry W Vishwa Dais III, MD  8:59 AM 04/23/2018  [facility]

## 2018-04-23 NOTE — Progress Notes (Signed)
Patient ID: Kimberly LutesCarolyn Wimer, female   DOB: 1951-03-24, 67 y.o.   MRN: [id number] TCTS DAILY ICU PROGRESS NOTE                   [address]            [place] [id number]          [phone]   6 Days Post-Op Procedure(s) (LRB): CORONARY ARTERY BYPASS GRAFTING (CABG) TIMES USING LEFT INTERNAL MAMMARY ARTERY TO LAD AND SAPHENOUS VEIN TO DIAG 1, OM1, AND RCA (N/A) TRANSESOPHAGEAL ECHOCARDIOGRAM (TEE) (N/A) ENDOVEIN HARVEST OF GREATER SAPHENOUS VEIN RIGHT THIGH AND CALF (Right)  Total Length of Stay:  LOS: 8 days   Subjective: Patient awake alert neurologically intact waiting for bed and stepdown, but ready for discharge home today Ambulating without difficulty off oxygen  Objective: Vital signs in last 24 hours: Temp:  [98.1 F (36.7 C)-98.8 F (37.1 C)] 98.5 F (36.9 C) (12/10 0751) Pulse Rate:  [69-96] 88 (12/10 0412) Cardiac Rhythm: Normal sinus rhythm (12/10 0400) Resp:  [16-28] 16 (12/10 0412) BP: (123-141)/(58-77) 141/77 (12/10 0412) SpO2:  [94 %-100 %] 99 % (12/10 0412) FiO2 (%):  [21 %] 21 % (12/09 2206) Weight:  [72.3 kg] 72.3 kg (12/10 0500)  Filed Weights   04/21/18 0500 04/22/18 0500 04/23/18 0500  Weight: 73.6 kg 72.9 kg 72.3 kg    Weight change: -0.6 kg   Hemodynamic parameters for last 24 hours:    Intake/Output from previous day: 12/09 0701 - 12/10 0700 In: 1920 [P.O.:1920] Out: -   Intake/Output this shift: No intake/output data recorded.  Current Meds: Scheduled Meds: . acyclovir  400 mg Oral BID  . aspirin EC  325 mg Oral Daily  . atorvastatin  80 mg Oral q1800  . enoxaparin (LOVENOX) injection  40 mg Subcutaneous QHS  . insulin aspart  0-24 Units Subcutaneous TID AC & HS  . levothyroxine  88 mcg Oral QAC breakfast  . mouth rinse  15 mL Mouth Rinse BID  . metoprolol tartrate  12.5 mg Oral BID  . sodium chloride flush  3 mL Intravenous Q12H   Continuous Infusions: . sodium chloride     PRN Meds:.sodium chloride,  acetaminophen, bisacodyl **OR** bisacodyl, guaiFENesin, ondansetron **OR** ondansetron (ZOFRAN) IV, sodium chloride flush, traMADol  General appearance: alert and cooperative Neurologic: intact Heart: regular rate and rhythm, S1, S2 normal, no murmur, click, rub or gallop Lungs: clear to auscultation bilaterally Abdomen: soft, non-tender; bowel sounds normal; no masses,  no organomegaly Extremities: extremities normal, atraumatic, no cyanosis or edema and Homans sign is negative, no sign of DVT Wound: Sternal incision is stable and healing well  Lab Results: CBC: Recent Labs    04/22/18 0321 04/23/18 0230  WBC 6.1 6.9  HGB 10.1* 11.5*  HCT 31.8* 34.6*  PLT 266 329   BMET:  Recent Labs    04/22/18 0321 04/23/18 0230  NA 140 139  K 4.2 4.5  CL 105 104  CO2 29 26  GLUCOSE 86 105*  BUN <5* <5*  CREATININE 0.66 0.74  CALCIUM 8.6* 9.3    CMET: Lab Results  Component Value Date   WBC 6.9 04/23/2018   HGB 11.5 (L) 04/23/2018   HCT 34.6 (L) 04/23/2018   PLT 329 04/23/2018   GLUCOSE 105 (H) 04/23/2018   CHOL 215 (H) 04/16/2018   TRIG 69 04/16/2018   HDL 72 04/16/2018   LDLCALC 129 (H) 04/16/2018   ALT 17 04/16/2018  AST 29 04/16/2018   NA 139 04/23/2018   K 4.5 04/23/2018   CL 104 04/23/2018   CREATININE 0.74 04/23/2018   BUN <5 (L) 04/23/2018   CO2 26 04/23/2018   TSH 1.43 04/15/2018   INR 1.25 04/17/2018   HGBA1C 5.3 04/16/2018      PT/INR: No results for input(s): LABPROT, INR in the last 72 hours. Radiology: No results found.   Assessment/Plan: S/P Procedure(s) (LRB): CORONARY ARTERY BYPASS GRAFTING (CABG) TIMES USING LEFT INTERNAL MAMMARY ARTERY TO LAD AND SAPHENOUS VEIN TO DIAG 1, OM1, AND RCA (N/A) TRANSESOPHAGEAL ECHOCARDIOGRAM (TEE) (N/A) ENDOVEIN HARVEST OF GREATER SAPHENOUS VEIN RIGHT THIGH AND CALF (Right) Mobilize Pacing wires are out Plan for discharge: Today    Delight Ovens 04/23/2018 7:59 AM

## 2018-04-23 NOTE — Progress Notes (Signed)
[id number] Pt stated she walked this morning without walker with steady gait so did not walk with her. Discussed sternal precautions and staying in the tube. Gave handout. Reviewed ex ed, heart healthy food choices, and IS. Pt familiar with CRP 2. [facility].  Pt voiced understanding of ed.Luetta Nutting.Lindsie Simar RN BSN 04/23/2018 10:19 AM

## 2018-04-23 NOTE — Progress Notes (Signed)
Patient discharged to discharge lounge with ride arranged to pick her up there. Discharge instructions explained to patient. IV taken out. Patient taken off monitor. Belongings sent with patient. VSS.

## 2018-04-24 ENCOUNTER — Telehealth: Payer: Self-pay

## 2018-04-24 ENCOUNTER — Other Ambulatory Visit: Payer: Self-pay

## 2018-04-24 MED ORDER — METOPROLOL TARTRATE 25 MG PO TABS: 12.5000 mg | ORAL_TABLET | Freq: Two times a day (BID) | ORAL | 1 refills | Status: DC

## 2018-04-24 NOTE — Telephone Encounter (Signed)
Patient contacted the office to state she did not get a new prescription for Metoprolol 12.5 mg when discharged from the hospital yesterday.  Prescription called into patient's pharmacy of choice, [facility] on [address] in [place].  Patient made aware and advised to take medication as prescribed.

## 2018-05-07 ENCOUNTER — Encounter: Payer: Self-pay | Admitting: Cardiology

## 2018-05-07 ENCOUNTER — Ambulatory Visit (INDEPENDENT_AMBULATORY_CARE_PROVIDER_SITE_OTHER): Payer: Medicare Other | Admitting: Cardiology

## 2018-05-07 VITALS — BP 130/76 | HR 67 | Ht 59.0 in | Wt 157.2 lb

## 2018-05-07 DIAGNOSIS — I214 Non-ST elevation (NSTEMI) myocardial infarction: Secondary | ICD-10-CM | POA: Diagnosis not present

## 2018-05-07 DIAGNOSIS — R0989 Other specified symptoms and signs involving the circulatory and respiratory systems: Secondary | ICD-10-CM | POA: Diagnosis not present

## 2018-05-07 DIAGNOSIS — E785 Hyperlipidemia, unspecified: Secondary | ICD-10-CM | POA: Diagnosis not present

## 2018-05-07 DIAGNOSIS — I2510 Atherosclerotic heart disease of native coronary artery without angina pectoris: Secondary | ICD-10-CM | POA: Diagnosis not present

## 2018-05-07 DIAGNOSIS — Z951 Presence of aortocoronary bypass graft: Secondary | ICD-10-CM

## 2018-05-07 MED ORDER — METOPROLOL TARTRATE 25 MG PO TABS: 12.5000 mg | ORAL_TABLET | Freq: Two times a day (BID) | ORAL | 11 refills | Status: DC

## 2018-05-07 MED ORDER — ATORVASTATIN CALCIUM 80 MG PO TABS: 80.0000 mg | ORAL_TABLET | Freq: Every day | ORAL | 3 refills | Status: DC

## 2018-05-07 MED ORDER — ASPIRIN EC 325 MG PO TBEC: 325.0000 mg | DELAYED_RELEASE_TABLET | Freq: Every day | ORAL | 0 refills | Status: DC

## 2018-05-07 MED ORDER — ATORVASTATIN CALCIUM 80 MG PO TABS: 80.0000 mg | ORAL_TABLET | Freq: Every day | ORAL | 0 refills | Status: DC

## 2018-05-07 NOTE — Assessment & Plan Note (Signed)
1-39% bilateral ICA stenosis

## 2018-05-07 NOTE — Assessment & Plan Note (Signed)
LIMA to LAD SVG to DIAGONAL SVG to OM SVG to RCA

## 2018-05-07 NOTE — Assessment & Plan Note (Signed)
Troponin 0.58 on 04/15/18

## 2018-05-07 NOTE — Patient Instructions (Addendum)
Medication Instructions Start taking ASPIRIN 325 MG ONE TABLET DAILY If you need a refill on your cardiac medications before your next appointment, please call your pharmacy.   Lab work: NOT NEEDED If you have labs (blood work) drawn today and your tests are completely normal, you will receive your results only by: Marland Kitchen. MyChart Message (if you have MyChart) OR . A paper copy in the mail If you have any lab test that is abnormal or we need to change your treatment, we will call you to review the results.  Testing/Procedures: NOT NEEDED  Follow-Up: At [facility], you and your health needs are our priority.  As part of our continuing mission to provide you with exceptional heart care, we have created designated Provider Care Teams.  These Care Teams include your primary Cardiologist (physician) and Advanced Practice Providers (APPs -  Physician Assistants and Nurse Practitioners) who all work together to provide you with the care you need, when you need it. You will need a follow up appointment in 3 months March 2020.  Please call our office 2 months in advance to schedule this appointment.  You may see Chilton Siiffany [place], MD or one of the following Advanced Practice Providers on your designated Care Team:   Corine ShelterLuke Kilroy, PA-C Judy PimpleKrista Kroeger, [place]-C . Marjie Skiffallie Goodrich, PA-C  Any Other Special Instructions Will Be Listed Below (If Applicable).

## 2018-05-07 NOTE — Progress Notes (Signed)
05/07/2018 [name]   Jun 01, 1950  [id number]  Primary Physician Copland, Gwenlyn Found, MD Primary Cardiologist: Dr Duke Salvia  HPI: [name] is a pleasant 67 year old female who moved here not long ago from [place] to help a friend whose spouse was sick.  She has no history of coronary disease.  She was a former heavy drinker and smoker but quit all that in the 1990s.  The patient presented to the emergency room 04/15/2018.  She had had "crushing" chest pain the night before for 2 hours.  That morning she had some dyspnea and called her family doctor who suggested she go to the emergency room.  In the emergency room her troponins were positive.  She was admitted and underwent diagnostic catheterization 04/16/2018.  This revealed 4 vessel coronary disease and coronary artery bypass grafting was recommended.  Interestingly she says that she had been exercising daily at the gym.  She admits now that she was having some right arm discomfort and dyspnea at times.  Her LV function was normal by echo with an EF of 65 to 70%.  She did have aortic valve sclerosis noted.  There was a minimal LV outflow tract gradient which was attributed to her vigorous LV function.  On 04/17/2018 she had bypass grafting x4 with an LIMA to the LAD, and SVG to the diagonal, and SVG to the OM, and SVG to the RCA.  She tolerated this well.  I believe she did have 4 hours of AF. Post op but that settled down before discharge.  She is in the office today for follow-up.  She says she is doing well.  She is not having any trouble sleeping and her appetite is good.  She denies any unusual tachycardia or shortness of breath.  During her hospitalization she was noted to have carotid bruits. Carotid Doppler done preop showed 1 to 39% bilateral internal carotid artery narrowing.  She asked about this and I reassured her that the treatment was medical therapy at this time.     Current Outpatient Medications  Medication Sig Dispense  Refill  . acetaminophen (TYLENOL) 325 MG tablet Take 2 tablets (650 mg total) by mouth every 6 (six) hours as needed for mild pain.    Marland Kitchen acyclovir (ZOVIRAX) 400 MG tablet Take 1 tablet (400 mg total) by mouth 2 (two) times daily. 180 tablet 3  . atorvastatin (LIPITOR) 80 MG tablet Take 1 tablet (80 mg total) by mouth daily at 6 PM. 15 tablet 0  . docusate sodium (COLACE) 100 MG capsule Take 100 mg by mouth at bedtime.     Marland Kitchen levothyroxine (SYNTHROID) 88 MCG tablet Take 1 tablet (88 mcg total) by mouth daily before breakfast. 90 tablet 3  . metoprolol tartrate (LOPRESSOR) 25 MG tablet Take 0.5 tablets (12.5 mg total) by mouth 2 (two) times daily. 30 tablet 11  . omeprazole (PRILOSEC) 40 MG capsule Take 1 capsule (40 mg total) by mouth daily. 90 capsule 3  . aspirin EC 325 MG tablet Take 1 tablet (325 mg total) by mouth daily. 30 tablet 0  . atorvastatin (LIPITOR) 80 MG tablet Take 1 tablet (80 mg total) by mouth daily. 90 tablet 3   No current facility-administered medications for this visit.     Allergies  Allergen Reactions  . Dye Fdc Blue  [Brilliant Blue Fcf] Anaphylaxis    Dye used for CAT scans  . Contrast Media [Iodinated Diagnostic Agents]     Past Medical History:  Diagnosis Date  .  Arthritis   . Chicken pox   . Dyslipidemia   . Genital herpes   . GERD (gastroesophageal reflux disease)   . Hepatitis B   . Hypothyroidism   . Kidney stone   . Pancreatitis     Social History   Socioeconomic History  . Marital status: Unknown    Spouse name: Not on file  . Number of children: Not on file  . Years of education: Not on file  . Highest education level: Not on file  Occupational History  . Not on file  Social Needs  . Financial resource strain: Not on file  . Food insecurity:    Worry: Not on file    Inability: Not on file  . Transportation needs:    Medical: Not on file    Non-medical: Not on file  Tobacco Use  . Smoking status: Former Games developermoker  . Smokeless  tobacco: Never Used  Substance and Sexual Activity  . Alcohol use: No  . Drug use: No  . Sexual activity: Never  Lifestyle  . Physical activity:    Days per week: Not on file    Minutes per session: Not on file  . Stress: Not on file  Relationships  . Social connections:    Talks on phone: Not on file    Gets together: Not on file    Attends religious service: Not on file    Active member of club or organization: Not on file    Attends meetings of clubs or organizations: Not on file    Relationship status: Not on file  . Intimate partner violence:    Fear of current or ex partner: Not on file    Emotionally abused: Not on file    Physically abused: Not on file    Forced sexual activity: Not on file  Other Topics Concern  . Not on file  Social History Narrative  . Not on file     Family History  Problem Relation Age of Onset  . Alcoholism Mother   . Kidney disease Mother   . Kidney Stones Mother   . Liver cancer Mother   . Alcoholism Father   . Hyperlipidemia Father   . Heart disease Father   . Heart failure Father   . Alcoholism Maternal Grandfather      Review of Systems: General: negative for chills, fever, night sweats or weight changes.  Cardiovascular: negative for chest pain, dyspnea on exertion, edema, orthopnea, palpitations, paroxysmal nocturnal dyspnea or shortness of breath Dermatological: negative for rash Respiratory: negative for cough or wheezing Urologic: negative for hematuria Abdominal: negative for nausea, vomiting, diarrhea, bright red blood per rectum, melena, or hematemesis Neurologic: negative for visual changes, syncope, or dizziness All other systems reviewed and are otherwise negative except as noted above.    Blood pressure 130/76, pulse 67, height 4\' 11"  (1.499 m), weight 157 lb 3.2 oz (71.3 kg).  General appearance: alert, cooperative, no distress and mildly obese Neck: no JVD and bilateral CA bruits Lungs: clear to auscultation  bilaterally Heart: regular rate and rhythm Extremities: no edema Skin: Skin color, texture, turgor normal. No rashes or lesions Neurologic: Grossly normal  EKG NSR, anterior lateral TWI  ASSESSMENT AND PLAN:   NSTEMI (non-ST elevated myocardial infarction) (HCC) Troponin 0.58 on 04/15/18  S/P CABG x 4 04/15/18 LIMA to LAD SVG to DIAGONAL SVG to OM SVG to RCA  Dyslipidemia LDL 129- discharged on high dose statin Rx  Bilateral carotid bruits 1-39% bilateral ICA  stenosis   PLAN  Same cardiac Rx. She tells me she was only taking 81 mg ASA- I suggested she increase that to 325 mg of ASA until directed otherwise by Dr Tyrone SageGerhardt.  She is doing well post CABG, so well that I suggested she slow down a little, no vigorous exercise, driving, or lifting till cleared by Dr Tyrone SageGerhardt.  F/U Dr Duke Salviaandolph in 3 months.   Corine ShelterLuke Dawnita Molner PA-C 05/07/2018 10:23 AM

## 2018-05-07 NOTE — Assessment & Plan Note (Signed)
LDL 129- discharged on high dose statin Rx

## 2018-05-22 ENCOUNTER — Other Ambulatory Visit: Payer: Self-pay | Admitting: Cardiothoracic Surgery

## 2018-05-22 DIAGNOSIS — H3562 Retinal hemorrhage, left eye: Secondary | ICD-10-CM | POA: Diagnosis not present

## 2018-05-22 DIAGNOSIS — H34832 Tributary (branch) retinal vein occlusion, left eye, with macular edema: Secondary | ICD-10-CM | POA: Diagnosis not present

## 2018-05-22 DIAGNOSIS — H43813 Vitreous degeneration, bilateral: Secondary | ICD-10-CM | POA: Diagnosis not present

## 2018-05-22 DIAGNOSIS — Z961 Presence of intraocular lens: Secondary | ICD-10-CM | POA: Diagnosis not present

## 2018-05-22 DIAGNOSIS — Z951 Presence of aortocoronary bypass graft: Secondary | ICD-10-CM

## 2018-05-23 ENCOUNTER — Ambulatory Visit
Admission: RE | Admit: 2018-05-23 | Discharge: 2018-05-23 | Disposition: A | Discharge status: Home / Self Care | Payer: Medicare Other | Source: Ambulatory Visit | Attending: Cardiothoracic Surgery | Admitting: Cardiothoracic Surgery

## 2018-05-23 ENCOUNTER — Ambulatory Visit (INDEPENDENT_AMBULATORY_CARE_PROVIDER_SITE_OTHER): Payer: Self-pay | Admitting: Cardiothoracic Surgery

## 2018-05-23 VITALS — BP 112/64 | HR 65 | Resp 20 | Ht 59.0 in | Wt 156.0 lb

## 2018-05-23 DIAGNOSIS — Z951 Presence of aortocoronary bypass graft: Secondary | ICD-10-CM

## 2018-05-23 DIAGNOSIS — I2510 Atherosclerotic heart disease of native coronary artery without angina pectoris: Secondary | ICD-10-CM

## 2018-05-23 NOTE — Progress Notes (Signed)
[address]       [place] [address]             [phone]      [name] [facility] Medical Record #[id number] Date of Birth: [birth year]  Referring: Delight Ovens, MD Primary Care: Pearline Cables, MD Primary Cardiologist: Chilton Si, MD   Chief Complaint:   POST OP FOLLOW UP OPERATIVE REPORT DATE OF PROCEDURE:  12/04/201 PREOPERATIVE DIAGNOSES:  Severe 3-vessel coronary artery disease with recent non-ST elevation myocardial infarction. POSTOPERATIVE DIAGNOSIS:  Severe 3-vessel coronary artery disease with recent non-ST elevation myocardial infarction. SURGICAL PROCEDURE:  Coronary artery bypass grafting x4 with the left internal mammary to the left anterior descending coronary artery, reverse saphenous vein graft to the first diagonal coronary artery, reverse saphenous vein graft to the first obtuse  marginal and reverse saphenous vein graft to the distal right coronary artery with right greater saphenous thigh and calf endoscopic vein harvesting. SURGEON:  Sheliah Plane, MD History of Present Illness:     Patient doing well postoperatively, denies recurrent angina or evidence of congestive heart failure.  She has been increasing her physical activity appropriately     Past Medical History:  Diagnosis Date  . Arthritis   . Chicken pox   . Dyslipidemia   . Genital herpes   . GERD (gastroesophageal reflux disease)   . Hepatitis B   . Hypothyroidism   . Kidney stone   . Pancreatitis      Social History   Tobacco Use  Smoking Status Former Smoker  Smokeless Tobacco Never Used    Social History   Substance and Sexual Activity  Alcohol Use No     Allergies  Allergen Reactions  . Dye Fdc Blue  [Brilliant Blue Fcf] Anaphylaxis    Dye used for CAT scans  . Contrast Media [Iodinated Diagnostic Agents]     Current Outpatient Medications  Medication Sig Dispense Refill  . acetaminophen (TYLENOL) 325 MG tablet Take  2 tablets (650 mg total) by mouth every 6 (six) hours as needed for mild pain.    Marland Kitchen acyclovir (ZOVIRAX) 400 MG tablet Take 1 tablet (400 mg total) by mouth 2 (two) times daily. 180 tablet 3  . aspirin EC 325 MG tablet Take 1 tablet (325 mg total) by mouth daily. 30 tablet 0  . atorvastatin (LIPITOR) 80 MG tablet Take 1 tablet (80 mg total) by mouth daily. 90 tablet 3  . docusate sodium (COLACE) 100 MG capsule Take 100 mg by mouth at bedtime.     Marland Kitchen levothyroxine (SYNTHROID) 88 MCG tablet Take 1 tablet (88 mcg total) by mouth daily before breakfast. 90 tablet 3  . metoprolol tartrate (LOPRESSOR) 25 MG tablet Take 0.5 tablets (12.5 mg total) by mouth 2 (two) times daily. 30 tablet 11  . omeprazole (PRILOSEC) 40 MG capsule Take 1 capsule (40 mg total) by mouth daily. 90 capsule 3   No current facility-administered medications for this visit.        Physical Exam: BP 112/64   Pulse 65   Resp 20   Ht 4\' 11"  (1.499 m)   Wt 156 lb (70.8 kg)   SpO2 99% Comment: RA  BMI 31.51 kg/m   General appearance: alert and cooperative Neurologic: intact Heart: regular rate and rhythm, S1, S2 normal, no murmur, click, rub or gallop Lungs: clear to auscultation bilaterally Abdomen: soft, non-tender; bowel sounds normal; no masses,  no organomegaly Extremities: extremities normal, atraumatic,  no cyanosis or edema and Homans sign is negative, no sign of DVT Wound: Sternal incision stable and well-healed, and harvest sites are well-healed she has no pedal edema   Diagnostic Studies & Laboratory data:     Recent Radiology Findings:   Dg Chest 2 View  Result Date: 05/23/2018 CLINICAL DATA:  Post CABG, follow-up EXAM: CHEST - 2 VIEW COMPARISON:  Chest x-ray of 04/23/2018 FINDINGS: The pleural effusions noted previously have completely resolved. No pneumonia is seen. The lungs are well aerated. Mediastinal and hilar contours are unremarkable. Median sternotomy sutures are present from CABG. The heart is  borderline enlarged. No bony abnormality is seen. IMPRESSION: No active lung disease.  Resolution of previous pleural effusions. Electronically Signed   By: Dwyane Dee M.D.   On: 05/23/2018 11:12      Recent Lab Findings: Lab Results  Component Value Date   WBC 6.9 04/23/2018   HGB 11.5 (L) 04/23/2018   HCT 34.6 (L) 04/23/2018   PLT 329 04/23/2018   GLUCOSE 105 (H) 04/23/2018   CHOL 215 (H) 04/16/2018   TRIG 69 04/16/2018   HDL 72 04/16/2018   LDLCALC 129 (H) 04/16/2018   ALT 17 04/16/2018   AST 29 04/16/2018   NA 139 04/23/2018   K 4.5 04/23/2018   CL 104 04/23/2018   CREATININE 0.74 04/23/2018   BUN <5 (L) 04/23/2018   CO2 26 04/23/2018   TSH 1.43 04/15/2018   INR 1.25 04/17/2018   HGBA1C 5.3 04/16/2018      Assessment / Plan:   #1 doing well status post coronary artery bypass grafting-patient was referred to cardiac rehab in [facility] regional Continued follow-up with cardiology She will return to the office as needed  Delight Ovens MD      [address] [place] [address] Office [phone]   Beeper [phone]  05/23/2018 1:17 PM

## 2018-06-05 DIAGNOSIS — Z951 Presence of aortocoronary bypass graft: Secondary | ICD-10-CM | POA: Diagnosis not present

## 2018-06-06 DIAGNOSIS — Z951 Presence of aortocoronary bypass graft: Secondary | ICD-10-CM | POA: Diagnosis not present

## 2018-06-10 DIAGNOSIS — Z951 Presence of aortocoronary bypass graft: Secondary | ICD-10-CM | POA: Diagnosis not present

## 2018-06-12 DIAGNOSIS — Z951 Presence of aortocoronary bypass graft: Secondary | ICD-10-CM | POA: Diagnosis not present

## 2018-06-13 ENCOUNTER — Other Ambulatory Visit: Payer: Self-pay | Admitting: Family Medicine

## 2018-06-13 DIAGNOSIS — Z951 Presence of aortocoronary bypass graft: Secondary | ICD-10-CM | POA: Diagnosis not present

## 2018-06-17 DIAGNOSIS — M722 Plantar fascial fibromatosis: Secondary | ICD-10-CM | POA: Diagnosis not present

## 2018-06-17 DIAGNOSIS — Z951 Presence of aortocoronary bypass graft: Secondary | ICD-10-CM | POA: Diagnosis not present

## 2018-06-19 DIAGNOSIS — Z951 Presence of aortocoronary bypass graft: Secondary | ICD-10-CM | POA: Diagnosis not present

## 2018-06-20 DIAGNOSIS — Z951 Presence of aortocoronary bypass graft: Secondary | ICD-10-CM | POA: Diagnosis not present

## 2018-06-24 ENCOUNTER — Other Ambulatory Visit: Payer: Self-pay | Admitting: *Deleted

## 2018-06-24 DIAGNOSIS — R0602 Shortness of breath: Secondary | ICD-10-CM

## 2018-06-24 DIAGNOSIS — Z951 Presence of aortocoronary bypass graft: Secondary | ICD-10-CM | POA: Diagnosis not present

## 2018-06-25 LAB — CBC
Hematocrit: 36.6 % (ref 34.0–46.6)
Hemoglobin: 12.5 g/dL (ref 11.1–15.9)
MCH: 32.4 pg (ref 26.6–33.0)
MCHC: 34.2 g/dL (ref 31.5–35.7)
MCV: 95 fL (ref 79–97)
Platelets: 282 10*3/uL (ref 150–450)
RBC: 3.86 x10E6/uL (ref 3.77–5.28)
RDW: 12.9 % (ref 11.7–15.4)
WBC: 6.2 10*3/uL (ref 3.4–10.8)

## 2018-06-26 DIAGNOSIS — Z951 Presence of aortocoronary bypass graft: Secondary | ICD-10-CM | POA: Diagnosis not present

## 2018-06-27 DIAGNOSIS — Z951 Presence of aortocoronary bypass graft: Secondary | ICD-10-CM | POA: Diagnosis not present

## 2018-07-01 DIAGNOSIS — Z951 Presence of aortocoronary bypass graft: Secondary | ICD-10-CM | POA: Diagnosis not present

## 2018-07-03 ENCOUNTER — Other Ambulatory Visit: Payer: Self-pay | Admitting: Family Medicine

## 2018-07-03 DIAGNOSIS — Z951 Presence of aortocoronary bypass graft: Secondary | ICD-10-CM | POA: Diagnosis not present

## 2018-07-03 DIAGNOSIS — B009 Herpesviral infection, unspecified: Secondary | ICD-10-CM

## 2018-07-03 MED ORDER — ACYCLOVIR 400 MG PO TABS: 400.0000 mg | ORAL_TABLET | Freq: Two times a day (BID) | ORAL | 3 refills | Status: DC

## 2018-07-03 NOTE — Telephone Encounter (Signed)
Requested Prescriptions  Pending Prescriptions Disp Refills  . acyclovir (ZOVIRAX) 400 MG tablet 180 tablet 3    Sig: Take 1 tablet (400 mg total) by mouth 2 (two) times daily.     Antimicrobials:  Antiviral Agents - Anti-Herpetic Passed - 07/03/2018  9:04 AM      Passed - Valid encounter within last 12 months    Recent Outpatient Visits          2 months ago Chest discomfort   [facility]iday representative at [facility], Gwenlyn Found, MD   1 year ago Hypothyroidism due to acquired atrophy of thyroid   Holiday representative at [facility], DeBary C, MD   2 years ago Hypothyroidism due to acquired atrophy of thyroid   Holiday representative at [facility], Gwenlyn Found, MD

## 2018-07-03 NOTE — Telephone Encounter (Signed)
Copied from CRM [id number]. Topic: Quick Communication - Rx Refill/Question >> Jul 03, 2018  8:41 AM Crist Infante wrote: Medication: acyclovir (ZOVIRAX) 400 MG tablet 90 days 180 tabs (BID) [facility] [facility] - [place], [place] - [address] [phone] (Phone) [phone] (Fax)

## 2018-07-04 DIAGNOSIS — Z951 Presence of aortocoronary bypass graft: Secondary | ICD-10-CM | POA: Diagnosis not present

## 2018-07-08 DIAGNOSIS — Z951 Presence of aortocoronary bypass graft: Secondary | ICD-10-CM | POA: Diagnosis not present

## 2018-07-10 DIAGNOSIS — H35352 Cystoid macular degeneration, left eye: Secondary | ICD-10-CM | POA: Diagnosis not present

## 2018-07-10 DIAGNOSIS — H35372 Puckering of macula, left eye: Secondary | ICD-10-CM | POA: Diagnosis not present

## 2018-07-10 DIAGNOSIS — H34832 Tributary (branch) retinal vein occlusion, left eye, with macular edema: Secondary | ICD-10-CM | POA: Diagnosis not present

## 2018-07-10 DIAGNOSIS — Z961 Presence of intraocular lens: Secondary | ICD-10-CM | POA: Diagnosis not present

## 2018-07-11 DIAGNOSIS — Z951 Presence of aortocoronary bypass graft: Secondary | ICD-10-CM | POA: Diagnosis not present

## 2018-07-15 DIAGNOSIS — Z951 Presence of aortocoronary bypass graft: Secondary | ICD-10-CM | POA: Diagnosis not present

## 2018-07-15 DIAGNOSIS — Z48812 Encounter for surgical aftercare following surgery on the circulatory system: Secondary | ICD-10-CM | POA: Diagnosis not present

## 2018-07-17 DIAGNOSIS — Z951 Presence of aortocoronary bypass graft: Secondary | ICD-10-CM | POA: Diagnosis not present

## 2018-07-17 DIAGNOSIS — Z48812 Encounter for surgical aftercare following surgery on the circulatory system: Secondary | ICD-10-CM | POA: Diagnosis not present

## 2018-07-18 DIAGNOSIS — Z48812 Encounter for surgical aftercare following surgery on the circulatory system: Secondary | ICD-10-CM | POA: Diagnosis not present

## 2018-07-18 DIAGNOSIS — Z951 Presence of aortocoronary bypass graft: Secondary | ICD-10-CM | POA: Diagnosis not present

## 2018-07-22 DIAGNOSIS — Z48812 Encounter for surgical aftercare following surgery on the circulatory system: Secondary | ICD-10-CM | POA: Diagnosis not present

## 2018-07-22 DIAGNOSIS — Z951 Presence of aortocoronary bypass graft: Secondary | ICD-10-CM | POA: Diagnosis not present

## 2018-07-24 DIAGNOSIS — Z951 Presence of aortocoronary bypass graft: Secondary | ICD-10-CM | POA: Diagnosis not present

## 2018-07-24 DIAGNOSIS — Z48812 Encounter for surgical aftercare following surgery on the circulatory system: Secondary | ICD-10-CM | POA: Diagnosis not present

## 2018-07-25 ENCOUNTER — Other Ambulatory Visit: Payer: Self-pay

## 2018-07-25 ENCOUNTER — Ambulatory Visit (INDEPENDENT_AMBULATORY_CARE_PROVIDER_SITE_OTHER): Payer: Medicare Other | Admitting: Cardiology

## 2018-07-25 ENCOUNTER — Encounter: Payer: Self-pay | Admitting: Cardiology

## 2018-07-25 VITALS — BP 110/74 | HR 75 | Ht 59.0 in | Wt 157.1 lb

## 2018-07-25 DIAGNOSIS — M79604 Pain in right leg: Secondary | ICD-10-CM

## 2018-07-25 DIAGNOSIS — B009 Herpesviral infection, unspecified: Secondary | ICD-10-CM | POA: Diagnosis not present

## 2018-07-25 DIAGNOSIS — Z48812 Encounter for surgical aftercare following surgery on the circulatory system: Secondary | ICD-10-CM | POA: Diagnosis not present

## 2018-07-25 DIAGNOSIS — R0989 Other specified symptoms and signs involving the circulatory and respiratory systems: Secondary | ICD-10-CM

## 2018-07-25 DIAGNOSIS — Z951 Presence of aortocoronary bypass graft: Secondary | ICD-10-CM

## 2018-07-25 DIAGNOSIS — E785 Hyperlipidemia, unspecified: Secondary | ICD-10-CM

## 2018-07-25 NOTE — Progress Notes (Signed)
Cardiology Office Note:    Date:  07/25/2018   ID:  [name], DOB Jun 09, 1950, MRN [id number]  PCP:  Pearline Cables, MD  Cardiologist:  Gypsy Balsam, MD    Referring MD: Pearline Cables, MD   Chief Complaint  Patient presents with  . Follow-up  I am doing well had bypass surgery  History of Present Illness:    [name] is a 68 y.o. female who recently got coronary artery bypass graft.  [name] is quite interesting she moved from [place] to help her friend to take care of her spouse.  She does not have any past medical history in terms of coronary artery disease.  She is a former heavy drinker and heavy smoker however quit doing this in 1990 she presented to the emergency room on 15 April 2018 with a crushing chest pain lasting for about 2 hours.  Troponin I was abnormal and then eventually cardiac catheterization was done on April 16, 2018 that revealed four-vessel disease.  Then discussion initiated about doing surgery and eventually coronary artery bypass graft has been done.  Her left ventricular ejection fraction was normal.  There was minimal LV outflow tract gradient.  She went to surgery with no difficulties interesting is the fact that she did exercise before bypass surgery and had no complaints except some mild shortness of breath nothing striking she is going to cardiac rehab which she enjoyed doing well denies have any chest pain tightness squeezing pressure been chest biggest complaint she has this jerking motion of her right leg this is the leg that graft was taken for bypasses.  Coronary artery bypass grafting was done on 17 April 2018 with LIMA to LAD SVG to diagonal branch SVG to obtuse marginal branch SVG to RCA. Denies have any chest pain tightness squeezing pressure burning chest. Used to drink and smoke but quit in 1990 Does have high cholesterol is taking high intensity statin. Multiple family members with coronary artery disease  Past Medical  History:  Diagnosis Date  . Arthritis   . Chicken pox   . Dyslipidemia   . Genital herpes   . GERD (gastroesophageal reflux disease)   . Hepatitis B   . Hypothyroidism   . Kidney stone   . Pancreatitis     Past Surgical History:  Procedure Laterality Date  . APPENDECTOMY  1972  . CORONARY ARTERY BYPASS GRAFT N/A 04/17/2018   Procedure: CORONARY ARTERY BYPASS GRAFTING (CABG) TIMES USING LEFT INTERNAL MAMMARY ARTERY TO LAD AND SAPHENOUS VEIN TO DIAG 1, OM1, AND RCA;  Surgeon: Delight Ovens, MD;  Location: [facility] OR;  Service: Open Heart Surgery;  Laterality: N/A;  . ENDOVEIN HARVEST OF GREATER SAPHENOUS VEIN Right 04/17/2018   Procedure: ENDOVEIN HARVEST OF GREATER SAPHENOUS VEIN RIGHT THIGH AND CALF;  Surgeon: Delight Ovens, MD;  Location: [facility] OR;  Service: Open Heart Surgery;  Laterality: Right;  . LAPAROSCOPY    . LEFT HEART CATH AND CORONARY ANGIOGRAPHY N/A 04/16/2018   Procedure: LEFT HEART CATH AND CORONARY ANGIOGRAPHY;  Surgeon: Kathleene Hazel, MD;  Location: [facility] INVASIVE CV LAB;  Service: Cardiovascular;  Laterality: N/A;  . OVARIAN CYST SURGERY    . TEE WITHOUT CARDIOVERSION N/A 04/17/2018   Procedure: TRANSESOPHAGEAL ECHOCARDIOGRAM (TEE);  Surgeon: Delight Ovens, MD;  Location[facility] OR;  Service: Open Heart Surgery;  Laterality: N/A;  . TONSILLECTOMY      Current Medications: Current Meds  Medication Sig  . acetaminophen (TYLENOL) 500 MG tablet Take  500 mg by mouth every 6 (six) hours as needed.  Marland Kitchen acyclovir (ZOVIRAX) 400 MG tablet Take 1 tablet (400 mg total) by mouth 2 (two) times daily.  Marland Kitchen aspirin EC 325 MG tablet Take 1 tablet (325 mg total) by mouth daily.  Marland Kitchen atorvastatin (LIPITOR) 80 MG tablet Take 1 tablet (80 mg total) by mouth daily.  Marland Kitchen docusate sodium (COLACE) 100 MG capsule Take 100 mg by mouth at bedtime.   Marland Kitchen levothyroxine (SYNTHROID, LEVOTHROID) 88 MCG tablet TAKE 1 TABLET BY MOUTH ONCE DAILY BEFORE BREAKFAST  . metoprolol tartrate (LOPRESSOR) 25  MG tablet Take 0.5 tablets (12.5 mg total) by mouth 2 (two) times daily.  Marland Kitchen omeprazole (PRILOSEC) 40 MG capsule Take 1 capsule (40 mg total) by mouth daily.     Allergies:   Dye fdc blue  [brilliant blue fcf] and Contrast media [iodinated diagnostic agents]   Social History   Socioeconomic History  . Marital status: Unknown    Spouse name: Not on file  . Number of children: Not on file  . Years of education: Not on file  . Highest education level: Not on file  Occupational History  . Not on file  Social Needs  . Financial resource strain: Not on file  . Food insecurity:    Worry: Not on file    Inability: Not on file  . Transportation needs:    Medical: Not on file    Non-medical: Not on file  Tobacco Use  . Smoking status: Former Games developer  . Smokeless tobacco: Never Used  Substance and Sexual Activity  . Alcohol use: No  . Drug use: No  . Sexual activity: Never  Lifestyle  . Physical activity:    Days per week: Not on file    Minutes per session: Not on file  . Stress: Not on file  Relationships  . Social connections:    Talks on phone: Not on file    Gets together: Not on file    Attends religious service: Not on file    Active member of club or organization: Not on file    Attends meetings of clubs or organizations: Not on file    Relationship status: Not on file  Other Topics Concern  . Not on file  Social History Narrative  . Not on file     Family History: The patient's family history includes Alcoholism in her father, maternal grandfather, and mother; Heart disease in her father; Heart failure in her father; Hyperlipidemia in her father; Kidney Stones in her mother; Kidney disease in her mother; Liver cancer in her mother. ROS:   Please see the history of present illness.    All 14 point review of systems negative except as described per history of present illness  EKGs/Labs/Other Studies Reviewed:      Recent Labs: 04/15/2018: TSH 1.43 04/16/2018: ALT  17 04/18/2018: Magnesium 2.2 04/23/2018: BUN <5; Creatinine, Ser 0.74; Potassium 4.5; Sodium 139 06/24/2018: Hemoglobin 12.5; Platelets 282  Recent Lipid Panel    Component Value Date/Time   CHOL 215 (H) 04/16/2018 0229   TRIG 69 04/16/2018 0229   HDL 72 04/16/2018 0229   CHOLHDL 3.0 04/16/2018 0229   VLDL 14 04/16/2018 0229   LDLCALC 129 (H) 04/16/2018 0229    Physical Exam:    VS:  BP 110/74   Pulse 75   Ht 4\' 11"  (1.499 m)   Wt 157 lb 1.9 oz (71.3 kg)   SpO2 96%   BMI 31.73 kg/m  Wt Readings from Last 3 Encounters:  07/25/18 157 lb 1.9 oz (71.3 kg)  05/23/18 156 lb (70.8 kg)  05/07/18 157 lb 3.2 oz (71.3 kg)     GEN:  Well nourished, well developed in no acute distress HEENT: Normal NECK: No JVD; soft bruit bilaterally LYMPHATICS: No lymphadenopathy CARDIAC: RRR, systolic ejection murmur grade 1/6 best heard at right upper portion of the sternum without radiation, no rubs, no gallops RESPIRATORY:  Clear to auscultation without rales, wheezing or rhonchi  ABDOMEN: Soft, non-tender, non-distended MUSCULOSKELETAL:  No edema; No deformity  SKIN: Warm and dry LOWER EXTREMITIES: no swelling NEUROLOGIC:  Alert and oriented x 3 PSYCHIATRIC:  Normal affect   ASSESSMENT:    1. Dyslipidemia   2. Pain of right lower extremity   3. S/P CABG x 4 04/15/18   4. HSV-2 (herpes simplex virus 2) infection   5. Bilateral carotid bruits    PLAN:    In order of problems listed above:  1. Dyslipidemia I advised to continue with 80 Lipitor will check her fasting lipid profile.  I told her that her LDL have to be less than 70 2. Pain in the right lower extremity with some jerking movement.  I will ask to have arterial duplex on the leg. 3. Is post coronary bypass graft with LIMA to LAD SVG to diagonal SVG to obtuse marginal SVG to RCA. 4. Bilateral carotid bruit ultrasounds has been done which showed lesions up to 39%.   Medication Adjustments/Labs and Tests Ordered: Current  medicines are reviewed at length with the patient today.  Concerns regarding medicines are outlined above.  Orders Placed This Encounter  Procedures  . Hepatic function panel  . Lipid Profile   Medication changes: No orders of the defined types were placed in this encounter.   Signed, Georgeanna Lea, MD, [facility] 07/25/2018 4:23 PM    [facility]

## 2018-07-25 NOTE — Patient Instructions (Signed)
Medication Instructions:  Your physician recommends that you continue on your current medications as directed. Please refer to the Current Medication list given to you today.  If you need a refill on your cardiac medications before your next appointment, please call your pharmacy.   Lab work: None.  If you have labs (blood work) drawn today and your tests are completely normal, you will receive your results only by: Marland Kitchen MyChart Message (if you have MyChart) OR . A paper copy in the mail If you have any lab test that is abnormal or we need to change your treatment, we will call you to review the results.  Testing/Procedures: Your physician has requested that you have a lower extremity arterial duplex. During this test, exercise and ultrasound are used to evaluate arterial blood flow in the legs. Allow one hour for this exam. There are no restrictions or special instructions.  Follow-Up: At [facility], you and your health needs are our priority.  As part of our continuing mission to provide you with exceptional heart care, we have created designated Provider Care Teams.  These Care Teams include your primary Cardiologist (physician) and Advanced Practice Providers (APPs -  Physician Assistants and Nurse Practitioners) who all work together to provide you with the care you need, when you need it. .  You will need a follow up appointment in 3 months.  Please call our office 2 months in advance to schedule this appointment.  You may see Chilton Si, MD or another member of our [facility] Provider Team in [place]: Norman Herrlich, MD . Belva Crome, MD  Any Other Special Instructions Will Be Listed Below (If Applicable).

## 2018-07-26 DIAGNOSIS — E785 Hyperlipidemia, unspecified: Secondary | ICD-10-CM | POA: Diagnosis not present

## 2018-07-27 LAB — LIPID PANEL
CHOL/HDL RATIO: 2.8 ratio (ref 0.0–4.4)
Cholesterol, Total: 164 mg/dL (ref 100–199)
HDL: 58 mg/dL (ref >39)
LDL Calculated: 89 mg/dL (ref 0–99)
Triglycerides: 87 mg/dL (ref 0–149)
VLDL Cholesterol Cal: 17 mg/dL (ref 5–40)

## 2018-07-27 LAB — HEPATIC FUNCTION PANEL
ALT: 11 IU/L (ref 0–32)
AST: 17 IU/L (ref 0–40)
Albumin: 4 g/dL (ref 3.8–4.8)
Alkaline Phosphatase: 73 IU/L (ref 39–117)
BILIRUBIN TOTAL: 0.2 mg/dL (ref 0.0–1.2)
Bilirubin, Direct: 0.08 mg/dL (ref 0.00–0.40)
Total Protein: 6.3 g/dL (ref 6.0–8.5)

## 2018-07-31 ENCOUNTER — Encounter: Payer: Self-pay | Admitting: Family Medicine

## 2018-07-31 ENCOUNTER — Telehealth: Payer: Self-pay | Admitting: Emergency Medicine

## 2018-07-31 DIAGNOSIS — K219 Gastro-esophageal reflux disease without esophagitis: Secondary | ICD-10-CM

## 2018-07-31 MED ORDER — OMEPRAZOLE 40 MG PO CPDR: 40.0000 mg | DELAYED_RELEASE_CAPSULE | Freq: Every day | ORAL | 3 refills | Status: DC

## 2018-07-31 MED ORDER — EZETIMIBE 10 MG PO TABS: 10.0000 mg | ORAL_TABLET | Freq: Every day | ORAL | 1 refills | Status: DC

## 2018-07-31 NOTE — Telephone Encounter (Signed)
Patient informed of lab results and advised to start Zetia 10 mg daily. She verbally understands. Prescription sent to requested pharmacy.

## 2018-08-05 ENCOUNTER — Telehealth: Payer: Self-pay | Admitting: Cardiology

## 2018-08-05 NOTE — Telephone Encounter (Signed)
° °  Primary Cardiologist:  Bing Matter  Patient contacted.  History reviewed.  No symptoms to suggest any unstable cardiac conditions.  Based on discussion, with current pandemic situation, we will be postponing this appointment for [name].  If symptoms change, she has been instructed to contact our office.    [name]  08/05/2018 12:25 PM         .

## 2018-08-06 ENCOUNTER — Ambulatory Visit (HOSPITAL_BASED_OUTPATIENT_CLINIC_OR_DEPARTMENT_OTHER): Payer: Medicare Other

## 2018-08-14 ENCOUNTER — Ambulatory Visit: Payer: Medicare Other | Admitting: Cardiovascular Disease

## 2018-08-27 NOTE — Telephone Encounter (Signed)
Recall has been placed for patient lower extremity arterial duplex

## 2018-09-25 ENCOUNTER — Ambulatory Visit (HOSPITAL_BASED_OUTPATIENT_CLINIC_OR_DEPARTMENT_OTHER)
Admission: RE | Admit: 2018-09-25 | Discharge: 2018-09-25 | Disposition: A | Discharge status: Home / Self Care | Payer: Medicare Other | Source: Ambulatory Visit | Attending: Cardiology | Admitting: Cardiology

## 2018-09-25 ENCOUNTER — Other Ambulatory Visit: Payer: Self-pay

## 2018-09-25 DIAGNOSIS — M79604 Pain in right leg: Secondary | ICD-10-CM | POA: Insufficient documentation

## 2018-09-30 ENCOUNTER — Telehealth: Payer: Self-pay | Admitting: Cardiology

## 2018-09-30 NOTE — Telephone Encounter (Signed)
Patient is calling because she was told somone would call her by 09/27/2018 with results from her recent tests.  Please call patient to discuss [phone]

## 2018-09-30 NOTE — Telephone Encounter (Signed)
Please review this test

## 2018-10-02 NOTE — Telephone Encounter (Signed)
Patient informed per Dr. Bevelyn Ngo that abi ultrasound was normal. Patient verbally understands. No further questions.

## 2018-11-04 ENCOUNTER — Telehealth (INDEPENDENT_AMBULATORY_CARE_PROVIDER_SITE_OTHER): Payer: Medicare Other | Admitting: Cardiology

## 2018-11-04 ENCOUNTER — Telehealth: Payer: Self-pay | Admitting: Cardiology

## 2018-11-04 ENCOUNTER — Other Ambulatory Visit: Payer: Self-pay

## 2018-11-04 ENCOUNTER — Encounter: Payer: Self-pay | Admitting: Cardiology

## 2018-11-04 VITALS — BP 112/72 | Wt 161.0 lb

## 2018-11-04 DIAGNOSIS — E039 Hypothyroidism, unspecified: Secondary | ICD-10-CM

## 2018-11-04 DIAGNOSIS — Z951 Presence of aortocoronary bypass graft: Secondary | ICD-10-CM

## 2018-11-04 DIAGNOSIS — E785 Hyperlipidemia, unspecified: Secondary | ICD-10-CM | POA: Diagnosis not present

## 2018-11-04 DIAGNOSIS — R0989 Other specified symptoms and signs involving the circulatory and respiratory systems: Secondary | ICD-10-CM

## 2018-11-04 NOTE — Addendum Note (Signed)
Addended by: Ashok Norris on: 11/04/2018 03:46 PM   Modules accepted: Orders

## 2018-11-04 NOTE — Telephone Encounter (Signed)
Virtual Visit Pre-Appointment Phone Call  "(Name), I am calling you today to discuss your upcoming appointment. We are currently trying to limit exposure to the virus that causes COVID-19 by seeing patients at home rather than in the office."  1. "What is the BEST phone number to call the day of the visit?" - include this in appointment notes  2. Do you have or have access to (through a family member/friend) a smartphone with video capability that we can use for your visit?" a. If yes - list this number in appt notes as cell (if different from BEST phone #) and list the appointment type as a VIDEO visit in appointment notes b. If no - list the appointment type as a PHONE visit in appointment notes  3. Confirm consent - "In the setting of the current Covid19 crisis, you are scheduled for a (phone or video) visit with your provider on (date) at (time).  Just as we do with many in-office visits, in order for you to participate in this visit, we must obtain consent.  If you'd like, I can send this to your mychart (if signed up) or email for you to review.  Otherwise, I can obtain your verbal consent now.  All virtual visits are billed to your insurance company just like a normal visit would be.  By agreeing to a virtual visit, we'd like you to understand that the technology does not allow for your provider to perform an examination, and thus may limit your provider's ability to fully assess your condition. If your provider identifies any concerns that need to be evaluated in person, we will make arrangements to do so.  Finally, though the technology is pretty good, we cannot assure that it will always work on either your or our end, and in the setting of a video visit, we may have to convert it to a phone-only visit.  In either situation, we cannot ensure that we have a secure connection.  Are you willing to proceed?" STAFF: Did the patient verbally acknowledge consent to telehealth visit? Document  YES/NO here: YES  4. Advise patient to be prepared - "Two hours prior to your appointment, go ahead and check your blood pressure, pulse, oxygen saturation, and your weight (if you have the equipment to check those) and write them all down. When your visit starts, your provider will ask you for this information. If you have an Apple Watch or Kardia device, please plan to have heart rate information ready on the day of your appointment. Please have a pen and paper handy nearby the day of the visit as well."  5. Give patient instructions for MyChart download to smartphone OR Doximity/Doxy.me as below if video visit (depending on what platform provider is using)  6. Inform patient they will receive a phone call 15 minutes prior to their appointment time (may be from unknown caller ID) so they should be prepared to answer    TELEPHONE CALL NOTE  [name] has been deemed a candidate for a follow-up tele-health visit to limit community exposure during the Covid-19 pandemic. I spoke with the patient via phone to ensure availability of phone/video source, confirm preferred email & phone number, and discuss instructions and expectations.  I reminded [name] to be prepared with any vital sign and/or heart rhythm information that could potentially be obtained via home monitoring, at the time of her visit. I reminded [name] to expect a phone call prior to her visit.  Isaiah Blakes 11/04/2018 3:10 PM   INSTRUCTIONS FOR DOWNLOADING THE MYCHART APP TO SMARTPHONE  - The patient must first make sure to have activated MyChart and know their login information - If Apple, go to [employer] and type in MyChart in the search bar and download the app. If Android, ask patient to go to [employer] and type in [place] in the search bar and download the app. The app is free but as with any other app downloads, their phone may require them to verify saved payment information or Apple/Android  password.  - The patient will need to then log into the app with their MyChart username and password, and select [facility] as their healthcare provider to link the account. When it is time for your visit, go to the MyChart app, find appointments, and click Begin Video Visit. Be sure to Select Allow for your device to access the Microphone and Camera for your visit. You will then be connected, and your provider will be with you shortly.  **If they have any issues connecting, or need assistance please contact MyChart service desk [phone])**  **If using a computer, in order to ensure the best quality for their visit they will need to use either of the following Internet Browsers: [employer], or Google Chrome**  IF USING DOXIMITY or DOXY.ME - The patient will receive a link just prior to their visit by text.     FULL LENGTH CONSENT FOR TELE-HEALTH VISIT   I hereby voluntarily request, consent and authorize [facility] and its employed or contracted physicians, physician assistants, nurse practitioners or other licensed health care professionals (the Practitioner), to provide me with telemedicine health care services (the Services") as deemed necessary by the treating Practitioner. I acknowledge and consent to receive the Services by the Practitioner via telemedicine. I understand that the telemedicine visit will involve communicating with the Practitioner through live audiovisual communication technology and the disclosure of certain medical information by electronic transmission. I acknowledge that I have been given the opportunity to request an in-person assessment or other available alternative prior to the telemedicine visit and am voluntarily participating in the telemedicine visit.  I understand that I have the right to withhold or withdraw my consent to the use of telemedicine in the course of my care at any time, without affecting my right to future care or treatment,  and that the Practitioner or I may terminate the telemedicine visit at any time. I understand that I have the right to inspect all information obtained and/or recorded in the course of the telemedicine visit and may receive copies of available information for a reasonable fee.  I understand that some of the potential risks of receiving the Services via telemedicine include:   Delay or interruption in medical evaluation due to technological equipment failure or disruption;  Information transmitted may not be sufficient (e.g. poor resolution of images) to allow for appropriate medical decision making by the Practitioner; and/or   In rare instances, security protocols could fail, causing a breach of personal health information.  Furthermore, I acknowledge that it is my responsibility to provide information about my medical history, conditions and care that is complete and accurate to the best of my ability. I acknowledge that Practitioner's advice, recommendations, and/or decision may be based on factors not within their control, such as incomplete or inaccurate data provided by me or distortions of diagnostic images or specimens that may result from electronic transmissions. I understand that the  practice of medicine is not an Chief Strategy Officer and that Practitioner makes no warranties or guarantees regarding treatment outcomes. I acknowledge that I will receive a copy of this consent concurrently upon execution via email to the email address I last provided but may also request a printed copy by calling the office of [facility].    I understand that my insurance will be billed for this visit.   I have read or had this consent read to me.  I understand the contents of this consent, which adequately explains the benefits and risks of the Services being provided via telemedicine.   I have been provided ample opportunity to ask questions regarding this consent and the Services and have had my questions  answered to my satisfaction.  I give my informed consent for the services to be provided through the use of telemedicine in my medical care  By participating in this telemedicine visit I agree to the above.

## 2018-11-04 NOTE — Progress Notes (Signed)
Virtual Visit via Telephone Note   This visit type was conducted due to national recommendations for restrictions regarding the COVID-19 Pandemic (e.g. social distancing) in an effort to limit this patient's exposure and mitigate transmission in our community.  Due to her co-morbid illnesses, this patient is at least at moderate risk for complications without adequate follow up.  This format is felt to be most appropriate for this patient at this time.  The patient did not have access to video technology/had technical difficulties with video requiring transitioning to audio format only (telephone).  All issues noted in this document were discussed and addressed.  No physical exam could be performed with this format.  Please refer to the patient's chart for her  consent to telehealth for [facility].  Evaluation Performed:  Follow-up visit  This visit type was conducted due to national recommendations for restrictions regarding the COVID-19 Pandemic (e.g. social distancing).  This format is felt to be most appropriate for this patient at this time.  All issues noted in this document were discussed and addressed.  No physical exam was performed (except for noted visual exam findings with Video Visits).  Please refer to the patient's chart (MyChart message for video visits and phone note for telephone visits) for the patient's consent to telehealth for [facility].  Date:  11/04/2018  ID: [name], DOB 26-Nov-1950, MRN [id number]   Patient Location: [address] [place]  [address]   Provider location:   [place] Office  PCP:  Copland, Gay Filler, MD  Cardiologist:  Jenne Campus, MD     Chief Complaint: Doing well  History of Present Illness:    [name] is a 68 y.o. female  who presents via audio/video conferencing for a telehealth visit today.  With history of coronary artery bypass graft last year, also hypertension, dyslipidemia she does  have a tele-visit with me today doing well denies have any chest pain tightness squeezing pressure burning chest.  Previously she complained of having pain in the legs.  ABIs was done and those were normal.   The patient does not have symptoms concerning for COVID-19 infection (fever, chills, cough, or new SHORTNESS OF BREATH).    Prior CV studies:   The following studies were reviewed today:  ABIs were normal     Past Medical History:  Diagnosis Date  . Arthritis   . Chicken pox   . Dyslipidemia   . Genital herpes   . GERD (gastroesophageal reflux disease)   . Hepatitis B   . Hypothyroidism   . Kidney stone   . Pancreatitis     Past Surgical History:  Procedure Laterality Date  . APPENDECTOMY  1972  . CORONARY ARTERY BYPASS GRAFT N/A 04/17/2018   Procedure: CORONARY ARTERY BYPASS GRAFTING (CABG) TIMES USING LEFT INTERNAL MAMMARY ARTERY TO LAD AND SAPHENOUS VEIN TO DIAG 1, OM1, AND RCA;  Surgeon: Grace Isaac, MD;  Location: [facility];  Service: Open Heart Surgery;  Laterality: N/A;  . ENDOVEIN HARVEST OF GREATER SAPHENOUS VEIN Right 04/17/2018   Procedure: ENDOVEIN HARVEST OF GREATER SAPHENOUS VEIN RIGHT THIGH AND CALF;  Surgeon: Grace Isaac, MD;  Location: [facility];  Service: Open Heart Surgery;  Laterality: Right;  . LAPAROSCOPY    . LEFT HEART CATH AND CORONARY ANGIOGRAPHY N/A 04/16/2018   Procedure: LEFT HEART CATH AND CORONARY ANGIOGRAPHY;  Surgeon: Burnell Blanks, MD;  Location: [facility];  Service: Cardiovascular;  Laterality: N/A;  .  OVARIAN CYST SURGERY    . TEE WITHOUT CARDIOVERSION N/A 04/17/2018   Procedure: TRANSESOPHAGEAL ECHOCARDIOGRAM (TEE);  Surgeon: Delight OvensGerhardt, Edward B, MD;  [facility];  Service: Open Heart Surgery;  Laterality: N/A;  . TONSILLECTOMY       Current Meds  Medication Sig  . acetaminophen (TYLENOL) 500 MG tablet Take 500 mg by mouth every 6 (six) hours as needed.  Marland Kitchen. acyclovir (ZOVIRAX) 400 MG tablet Take 1 tablet  (400 mg total) by mouth 2 (two) times daily.  Marland Kitchen. aspirin EC 325 MG tablet Take 1 tablet (325 mg total) by mouth daily.  Marland Kitchen. atorvastatin (LIPITOR) 80 MG tablet Take 1 tablet (80 mg total) by mouth daily.  Marland Kitchen. docusate sodium (COLACE) 100 MG capsule Take 100 mg by mouth at bedtime.   Marland Kitchen. levothyroxine (SYNTHROID, LEVOTHROID) 88 MCG tablet TAKE 1 TABLET BY MOUTH ONCE DAILY BEFORE BREAKFAST  . metoprolol tartrate (LOPRESSOR) 25 MG tablet Take 0.5 tablets (12.5 mg total) by mouth 2 (two) times daily.  Marland Kitchen. omeprazole (PRILOSEC) 40 MG capsule Take 1 capsule (40 mg total) by mouth daily.      Family History: The patient's family history includes Alcoholism in her father, maternal grandfather, and mother; Heart disease in her father; Heart failure in her father; Hyperlipidemia in her father; Kidney Stones in her mother; Kidney disease in her mother; Liver cancer in her mother.   ROS:   Please see the history of present illness.     All other systems reviewed and are negative.   Labs/Other Tests and Data Reviewed:     Recent Labs: 04/15/2018: TSH 1.43 04/18/2018: Magnesium 2.2 04/23/2018: BUN <5; Creatinine, Ser 0.74; Potassium 4.5; Sodium 139 06/24/2018: Hemoglobin 12.5; Platelets 282 07/26/2018: ALT 11  Recent Lipid Panel    Component Value Date/Time   CHOL 164 07/26/2018 1008   TRIG 87 07/26/2018 1008   HDL 58 07/26/2018 1008   CHOLHDL 2.8 07/26/2018 1008   CHOLHDL 3.0 04/16/2018 0229   VLDL 14 04/16/2018 0229   LDLCALC 89 07/26/2018 1008      Exam:    Vital Signs:  BP 112/72   Wt 161 lb (73 kg)   BMI 32.52 kg/m     Wt Readings from Last 3 Encounters:  11/04/18 161 lb (73 kg)  07/25/18 157 lb 1.9 oz (71.3 kg)  05/23/18 156 lb (70.8 kg)     Well nourished, well developed in no acute distress. Alert awake and x3 talking to me over the phone without any symptoms.  Diagnosis for this visit:   1. S/P CABG x 4 04/15/18   2. Dyslipidemia   3. Bilateral carotid bruits   4.  Hypothyroidism (acquired)      ASSESSMENT & PLAN:    1.  Status post coronary bypass graft asymptomatic we talked about healthy lifestyle good eating and exercises on a regular basis when she is trying to do 2.  Dyslipidemia I asked her to start taking Zetia however she does not want to do it she want to wait a little bit and she want to recheck her cholesterol in about 2 to 3 months which we will do. 3.  Bilateral carotid bruit without critical stenosis.  Continue monitoring 4.  Hypothyroidism followed by internal medicine team  COVID-19 Education: The signs and symptoms of COVID-19 were discussed with the patient and how to seek care for testing (follow up with PCP or arrange E-visit).  The importance of social distancing was discussed today.  Patient Risk:  After full review of this patients clinical status, I feel that they are at least moderate risk at this time.  Time:   Today, I have spent 15 minutes with the patient with telehealth technology discussing pt health issues.  I spent 5 minutes reviewing her chart before the visit.  Visit was finished at 3:37 PM.    Medication Adjustments/Labs and Tests Ordered: Current medicines are reviewed at length with the patient today.  Concerns regarding medicines are outlined above.  No orders of the defined types were placed in this encounter.  Medication changes: No orders of the defined types were placed in this encounter.    Disposition: Follow-up in 5 months  Signed, Georgeanna Leaobert J. , MD, [facility] 11/04/2018 3:37 PM    [facility]

## 2018-11-04 NOTE — Patient Instructions (Signed)
Medication Instructions:  Your physician recommends that you continue on your current medications as directed. Please refer to the Current Medication list given to you today.  If you need a refill on your cardiac medications before your next appointment, please call your pharmacy.   Lab work: Your physician recommends that you return for lab work in 2 months fasting: lipids   If you have labs (blood work) drawn today and your tests are completely normal, you will receive your results only by: Marland Kitchen MyChart Message (if you have MyChart) OR . A paper copy in the mail If you have any lab test that is abnormal or we need to change your treatment, we will call you to review the results.  Testing/Procedures: None.   Follow-Up: At [facility], you and your health needs are our priority.  As part of our continuing mission to provide you with exceptional heart care, we have created designated Provider Care Teams.  These Care Teams include your primary Cardiologist (physician) and Advanced Practice Providers (APPs -  Physician Assistants and Nurse Practitioners) who all work together to provide you with the care you need, when you need it. You will need a follow up appointment in 5 months.  Please call our office 2 months in advance to schedule this appointment.  You may see Skeet Latch, MD or another member of our [facility] Provider Team in [place]: Shirlee More, MD . Jyl Heinz, MD  Any Other Special Instructions Will Be Listed Below (If Applicable).

## 2018-11-13 HISTORY — PX: WRIST SURGERY: SHX841

## 2018-11-20 DIAGNOSIS — H3589 Other specified retinal disorders: Secondary | ICD-10-CM | POA: Diagnosis not present

## 2018-11-20 DIAGNOSIS — Z961 Presence of intraocular lens: Secondary | ICD-10-CM | POA: Diagnosis not present

## 2018-11-20 DIAGNOSIS — H34832 Tributary (branch) retinal vein occlusion, left eye, with macular edema: Secondary | ICD-10-CM | POA: Diagnosis not present

## 2018-12-02 ENCOUNTER — Other Ambulatory Visit: Payer: Self-pay | Admitting: Family Medicine

## 2018-12-09 ENCOUNTER — Emergency Department (HOSPITAL_BASED_OUTPATIENT_CLINIC_OR_DEPARTMENT_OTHER)
Admission: EM | Admit: 2018-12-09 | Discharge: 2018-12-09 | Disposition: A | Discharge status: Home / Self Care | Payer: Medicare Other | Attending: Emergency Medicine | Admitting: Emergency Medicine

## 2018-12-09 ENCOUNTER — Other Ambulatory Visit: Payer: Self-pay

## 2018-12-09 ENCOUNTER — Encounter (HOSPITAL_BASED_OUTPATIENT_CLINIC_OR_DEPARTMENT_OTHER): Payer: Self-pay | Admitting: *Deleted

## 2018-12-09 ENCOUNTER — Emergency Department (HOSPITAL_BASED_OUTPATIENT_CLINIC_OR_DEPARTMENT_OTHER): Payer: Medicare Other

## 2018-12-09 DIAGNOSIS — I252 Old myocardial infarction: Secondary | ICD-10-CM | POA: Diagnosis not present

## 2018-12-09 DIAGNOSIS — Z951 Presence of aortocoronary bypass graft: Secondary | ICD-10-CM | POA: Diagnosis not present

## 2018-12-09 DIAGNOSIS — S52501S Unspecified fracture of the lower end of right radius, sequela: Secondary | ICD-10-CM | POA: Diagnosis not present

## 2018-12-09 DIAGNOSIS — E039 Hypothyroidism, unspecified: Secondary | ICD-10-CM | POA: Diagnosis not present

## 2018-12-09 DIAGNOSIS — Y939 Activity, unspecified: Secondary | ICD-10-CM | POA: Diagnosis not present

## 2018-12-09 DIAGNOSIS — Y929 Unspecified place or not applicable: Secondary | ICD-10-CM | POA: Diagnosis not present

## 2018-12-09 DIAGNOSIS — Z87891 Personal history of nicotine dependence: Secondary | ICD-10-CM | POA: Insufficient documentation

## 2018-12-09 DIAGNOSIS — S52502A Unspecified fracture of the lower end of left radius, initial encounter for closed fracture: Secondary | ICD-10-CM | POA: Diagnosis not present

## 2018-12-09 DIAGNOSIS — Z7982 Long term (current) use of aspirin: Secondary | ICD-10-CM | POA: Diagnosis not present

## 2018-12-09 DIAGNOSIS — Z79899 Other long term (current) drug therapy: Secondary | ICD-10-CM | POA: Insufficient documentation

## 2018-12-09 DIAGNOSIS — Y999 Unspecified external cause status: Secondary | ICD-10-CM | POA: Diagnosis not present

## 2018-12-09 DIAGNOSIS — S59912A Unspecified injury of left forearm, initial encounter: Secondary | ICD-10-CM | POA: Diagnosis present

## 2018-12-09 DIAGNOSIS — S52592A Other fractures of lower end of left radius, initial encounter for closed fracture: Secondary | ICD-10-CM | POA: Diagnosis not present

## 2018-12-09 DIAGNOSIS — W010XXA Fall on same level from slipping, tripping and stumbling without subsequent striking against object, initial encounter: Secondary | ICD-10-CM | POA: Diagnosis not present

## 2018-12-09 MED ORDER — OXYCODONE HCL 5 MG PO TABS: 5.0000 mg | ORAL_TABLET | Freq: Four times a day (QID) | ORAL | 0 refills | Status: DC | PRN

## 2018-12-09 MED ORDER — LIDOCAINE HCL (PF) 1 % IJ SOLN
5.0000 mL | Freq: Once | INTRAMUSCULAR | Status: AC
  Administered 2018-12-09: 5 mL
  Filled 2018-12-09: qty 5

## 2018-12-09 NOTE — ED Triage Notes (Signed)
She tripped over electrical wire and fell. Injury to left wrist. Deformity noted.

## 2018-12-09 NOTE — ED Notes (Signed)
ED Provider at bedside. 

## 2018-12-09 NOTE — Discharge Instructions (Signed)
Please return if any worsening pain, swelling, sensation changes, color changes to your fingers as discussed.

## 2018-12-09 NOTE — ED Notes (Signed)
Call Dr. Grandville Silos  (hand surgery)

## 2018-12-09 NOTE — ED Provider Notes (Signed)
[facility] EMERGENCY DEPARTMENT Provider Note   CSN: [id number] Arrival date & time: 12/09/18  1319    History   Chief Complaint Chief Complaint  Patient presents with  . Wrist Injury    HPI [name] is a 68 y.o. female.     Patient fell forward onto her left wrist.  Deformity noted.  Denies any numbness or tingling.  The history is provided by the patient.  Wrist Pain This is a new problem. The current episode started 1 to 2 hours ago. The problem occurs constantly. The problem has not changed since onset.Pertinent negatives include no chest pain, no abdominal pain, no headaches and no shortness of breath. Nothing aggravates the symptoms. Nothing relieves the symptoms. She has tried nothing for the symptoms. The treatment provided no relief.    Past Medical History:  Diagnosis Date  . Arthritis   . Chicken pox   . Dyslipidemia   . Genital herpes   . GERD (gastroesophageal reflux disease)   . Hepatitis B   . Hypothyroidism   . Kidney stone   . Pancreatitis     Patient Active Problem List   Diagnosis Date Noted  . S/P CABG x 4 04/15/18 04/17/2018  . NSTEMI (non-ST elevated myocardial infarction) (HCC) 04/15/2018  . Bilateral carotid bruits   . Systolic murmur   . Osteopenia 11/30/2017  . GERD (gastroesophageal reflux disease) 06/11/2016  . Osteoarthritis 06/11/2016  . Hypothyroidism (acquired) 06/11/2016  . Dyslipidemia 06/11/2016  . HSV-2 (herpes simplex virus 2) infection 06/11/2016    Past Surgical History:  Procedure Laterality Date  . APPENDECTOMY  1972  . CORONARY ARTERY BYPASS GRAFT N/A 04/17/2018   Procedure: CORONARY ARTERY BYPASS GRAFTING (CABG) TIMES USING LEFT INTERNAL MAMMARY ARTERY TO LAD AND SAPHENOUS VEIN TO DIAG 1, OM1, AND RCA;  Surgeon: Delight OvensGerhardt, Edward B, MD;  Location: [facility] OR;  Service: Open Heart Surgery;  Laterality: N/A;  . ENDOVEIN HARVEST OF GREATER SAPHENOUS VEIN Right 04/17/2018   Procedure: ENDOVEIN HARVEST OF  GREATER SAPHENOUS VEIN RIGHT THIGH AND CALF;  Surgeon: Delight OvensGerhardt, Edward B, MD;  [facility];  Service: Open Heart Surgery;  Laterality: Right;  . LAPAROSCOPY    . LEFT HEART CATH AND CORONARY ANGIOGRAPHY N/A 04/16/2018   Procedure: LEFT HEART CATH AND CORONARY ANGIOGRAPHY;  Surgeon: Kathleene HazelMcAlhany, Christopher D, MD;  Location: [facility] INVASIVE CV LAB;  Service: Cardiovascular;  Laterality: N/A;  . OVARIAN CYST SURGERY    . TEE WITHOUT CARDIOVERSION N/A 04/17/2018   Procedure: TRANSESOPHAGEAL ECHOCARDIOGRAM (TEE);  Surgeon: Delight OvensGerhardt, Edward B, MD;  [facility];  Service: Open Heart Surgery;  Laterality: N/A;  . TONSILLECTOMY       OB History    Gravida  2   Para  1   Term  1   Preterm  0   AB  1   Living  1     SAB  0   TAB  1   Ectopic  0   Multiple  0   Live Births  1            Home Medications    Prior to Admission medications   Medication Sig Start Date End Date Taking? Authorizing Provider  acetaminophen (TYLENOL) 500 MG tablet Take 500 mg by mouth every 6 (six) hours as needed.    [provider]  acyclovir (ZOVIRAX) 400 MG tablet Take 1 tablet (400 mg total) by mouth 2 (two) times daily. 07/03/18   Copland, Gwenlyn FoundJessica C, MD  aspirin EC 325 MG tablet Take 1 tablet (325 mg total) by mouth daily. 05/07/18   Abelino DerrickKilroy, Luke K, PA-C  atorvastatin (LIPITOR) 80 MG tablet Take 1 tablet (80 mg total) by mouth daily. 05/07/18 11/04/19  Abelino DerrickKilroy, Luke K, PA-C  docusate sodium (COLACE) 100 MG capsule Take 100 mg by mouth at bedtime.     [provider]  EUTHYROX 88 MCG tablet TAKE 1 TABLET BY MOUTH ONCE DAILY BEFORE BREAKFAST 12/03/18   Copland, Gwenlyn FoundJessica C, MD  metoprolol tartrate (LOPRESSOR) 25 MG tablet Take 0.5 tablets (12.5 mg total) by mouth 2 (two) times daily. 05/07/18   Abelino DerrickKilroy, Luke K, PA-C  omeprazole (PRILOSEC) 40 MG capsule Take 1 capsule (40 mg total) by mouth daily. 07/31/18   Copland, Gwenlyn FoundJessica C, MD  oxyCODONE (ROXICODONE) 5 MG immediate release tablet  Take 1 tablet (5 mg total) by mouth every 6 (six) hours as needed for up to 10 doses for severe pain. 12/09/18   Virgina Norfolkuratolo, Khali Albanese, DO    Family History Family History  Problem Relation Age of Onset  . Alcoholism Mother   . Kidney disease Mother   . Kidney Stones Mother   . Liver cancer Mother   . Alcoholism Father   . Hyperlipidemia Father   . Heart disease Father   . Heart failure Father   . Alcoholism Maternal Grandfather     Social History Social History   Tobacco Use  . Smoking status: Former Games developermoker  . Smokeless tobacco: Never Used  Substance Use Topics  . Alcohol use: No  . Drug use: No     Allergies   Dye fdc blue  [brilliant blue fcf] and Contrast media [iodinated diagnostic agents]   Review of Systems Review of Systems  Constitutional: Negative for chills and fever.  HENT: Negative for ear pain and sore throat.   Eyes: Negative for pain and visual disturbance.  Respiratory: Negative for cough and shortness of breath.   Cardiovascular: Negative for chest pain and palpitations.  Gastrointestinal: Negative for abdominal pain and vomiting.  Genitourinary: Negative for dysuria and hematuria.  Musculoskeletal: Positive for arthralgias and joint swelling. Negative for back pain.  Skin: Negative for color change and rash.  Neurological: Negative for seizures, syncope and headaches.  All other systems reviewed and are negative.    Physical Exam Updated Vital Signs  ED Triage Vitals  Enc Vitals Group     BP 12/09/18 1333 140/76     Pulse Rate 12/09/18 1333 67     Resp 12/09/18 1333 14     Temp 12/09/18 1333 98.2 F (36.8 C)     Temp Source 12/09/18 1333 Oral     SpO2 12/09/18 1333 100 %     Weight 12/09/18 1331 160 lb (72.6 kg)     Height 12/09/18 1331 4\' 11"  (1.499 m)     Head Circumference --      Peak Flow --      Pain Score 12/09/18 1331 7     Pain Loc --      Pain Edu? --      Excl. in GC? --     Physical Exam Vitals signs and nursing note  reviewed.  Constitutional:      General: She is not in acute distress.    Appearance: She is well-developed. She is not ill-appearing.  HENT:     Head: Normocephalic and atraumatic.     Right Ear: Tympanic membrane normal.     Nose: Nose normal.  Mouth/Throat:     Mouth: Mucous membranes are moist.  Eyes:     Extraocular Movements: Extraocular movements intact.     Conjunctiva/sclera: Conjunctivae normal.     Pupils: Pupils are equal, round, and reactive to light.  Neck:     Musculoskeletal: Normal range of motion and neck supple.  Cardiovascular:     Rate and Rhythm: Normal rate and regular rhythm.     Pulses: Normal pulses.     Heart sounds: No murmur.  Pulmonary:     Effort: Pulmonary effort is normal. No respiratory distress.     Breath sounds: Normal breath sounds.  Abdominal:     Palpations: Abdomen is soft.     Tenderness: There is no abdominal tenderness.  Musculoskeletal:        General: Swelling, tenderness, deformity and signs of injury present.     Comments: Decreased range of motion at the left wrist due to pain, swelling to the left wrist with tenderness and deformity  Skin:    General: Skin is warm and dry.  Neurological:     General: No focal deficit present.     Mental Status: She is alert.     Sensory: No sensory deficit.     Motor: No weakness.      ED Treatments / Results  Labs (all labs ordered are listed, but only abnormal results are displayed) Labs Reviewed - No data to display  EKG None  Radiology Dg Wrist Complete Left  Result Date: 12/09/2018 CLINICAL DATA:  Post reduction. EXAM: LEFT WRIST - COMPLETE 3+ VIEW COMPARISON:  Prior study same day. FINDINGS: Patient is in a splint. Comminuted displaced angulated fracture of the distal left radius is again noted. Displaced old fracture of the ulnar styloid is again noted. Similar findings noted on prior exam. IMPRESSION: Patient has been splinted. Comminuted, displaced, angulated fracture of  the distal left radius is again noted. Old ulnar styloid fracture is again noted. Electronically Signed   By: Maisie Fushomas  Register   On: 12/09/2018 15:50   Dg Wrist Complete Left  Result Date: 12/09/2018 CLINICAL DATA:  LEFT wrist pain and swelling after fall today, obvious deformity. EXAM: LEFT WRIST - COMPLETE 3+ VIEW COMPARISON:  None. FINDINGS: Displaced/comminuted fracture of the distal LEFT radius, with posterior displacement of the main proximal fracture fragment and at least some degree of impaction at the fracture site. Suspected fracture extension to the articular surface. Old fracture deformities of the ulnar styloid. No convincing evidence of acute fracture or dislocation within the carpal bones. IMPRESSION: Displaced/comminuted fracture of the distal LEFT radius, as detailed above. Electronically Signed   By: Bary RichardStan  Maynard M.D.   On: 12/09/2018 13:59    Procedures .Ortho Injury Treatment  Date/Time: 12/09/2018 4:04 PM Performed by: Virgina Norfolkuratolo, Avary Pitsenbarger, DO Authorized by: Virgina Norfolkuratolo, Adedamola Seto, DO   Consent:    Consent obtained:  Verbal   Consent given by:  Patient   Risks discussed:  Fracture, irreducible dislocation, nerve damage, recurrent dislocation, stiffness, vascular damage and restricted joint movement   Alternatives discussed:  No treatment, referral, immobilization, alternative treatment and delayed treatmentInjury location: wrist Location details: left wrist Injury type: fracture Fracture type: distal radius Pre-procedure neurovascular assessment: neurovascularly intact Pre-procedure distal perfusion: normal Pre-procedure neurological function: normal Pre-procedure range of motion: reduced Anesthesia: hematoma block  Anesthesia: Local anesthesia used: yes Local Anesthetic: lidocaine 1% without epinephrine Anesthetic total: 3 mL  Patient sedated: NoManipulation performed: yes Skin traction used: yes Skeletal traction used: yes Reduction successful: partial, no  complete  reduction  X-ray confirmed reduction: yes (xray did not show major improvement) Immobilization: splint Splint type: sugar tong Post-procedure neurovascular assessment: post-procedure neurovascularly intact Post-procedure distal perfusion: normal Post-procedure neurological function: normal Post-procedure range of motion: normal Patient tolerance: patient tolerated the procedure well with no immediate complications    (including critical care time)  Medications Ordered in ED Medications  lidocaine (PF) (XYLOCAINE) 1 % injection 5 mL (5 mLs Infiltration Given 12/09/18 1416)     Initial Impression / Assessment and Plan / ED Course  I have reviewed the triage vital signs and the nursing notes.  Pertinent labs & imaging results that were available during my care of the patient were reviewed by me and considered in my medical decision making (see chart for details).     [name] is a 68 year old female with history of reflux who presents to the ED with left wrist injury after a fall.  Patient with normal vitals.  No fever.  Patient with deformity to the left wrist.  X-ray confirmed comminuted and displaced left distal radius fracture.  Patient with normal sensation and strength on exam.  Normal pulses.  Patient had a hematoma block placed with lidocaine and was hung in finger traps and had manual reduction performed.  Overall reduction showed some mild improvement.   Wrist was splinted.  Patient has no neurovascular compromise on reexamination.  Good sensation, good pulses.  Talk to Dr. Grandville Silos with hand and patient will follow-up outpatient as will need surgery. He will see patient in two days. Patient does not have any active narcotic scripts will prescribe Roxicodone but recommend Tylenol as well.  Patient discharged from ED in good condition.  Given return precautions.  This chart was dictated using voice recognition software.  Despite best efforts to proofread,  errors can occur  which can change the documentation meaning.    Final Clinical Impressions(s) / ED Diagnoses   Final diagnoses:  Closed fracture of distal end of left radius, unspecified fracture morphology, initial encounter    ED Discharge Orders         Ordered    oxyCODONE (ROXICODONE) 5 MG immediate release tablet  Every 6 hours PRN     12/09/18 Dupont, Normanna, DO 12/09/18 1613

## 2018-12-11 DIAGNOSIS — M25532 Pain in left wrist: Secondary | ICD-10-CM | POA: Diagnosis not present

## 2018-12-11 DIAGNOSIS — S52572A Other intraarticular fracture of lower end of left radius, initial encounter for closed fracture: Secondary | ICD-10-CM | POA: Diagnosis not present

## 2018-12-12 DIAGNOSIS — S52572A Other intraarticular fracture of lower end of left radius, initial encounter for closed fracture: Secondary | ICD-10-CM | POA: Diagnosis not present

## 2018-12-13 DIAGNOSIS — Z01812 Encounter for preprocedural laboratory examination: Secondary | ICD-10-CM | POA: Diagnosis not present

## 2018-12-13 DIAGNOSIS — Z1159 Encounter for screening for other viral diseases: Secondary | ICD-10-CM | POA: Diagnosis not present

## 2018-12-13 DIAGNOSIS — S52502D Unspecified fracture of the lower end of left radius, subsequent encounter for closed fracture with routine healing: Secondary | ICD-10-CM | POA: Diagnosis not present

## 2018-12-13 DIAGNOSIS — S62102A Fracture of unspecified carpal bone, left wrist, initial encounter for closed fracture: Secondary | ICD-10-CM

## 2018-12-13 DIAGNOSIS — Z20828 Contact with and (suspected) exposure to other viral communicable diseases: Secondary | ICD-10-CM | POA: Diagnosis not present

## 2018-12-13 HISTORY — DX: Fracture of unspecified carpal bone, left wrist, initial encounter for closed fracture: S62.102A

## 2018-12-20 DIAGNOSIS — Z951 Presence of aortocoronary bypass graft: Secondary | ICD-10-CM | POA: Diagnosis not present

## 2018-12-20 DIAGNOSIS — S52562A Barton's fracture of left radius, initial encounter for closed fracture: Secondary | ICD-10-CM | POA: Diagnosis not present

## 2018-12-20 DIAGNOSIS — S52572A Other intraarticular fracture of lower end of left radius, initial encounter for closed fracture: Secondary | ICD-10-CM | POA: Diagnosis not present

## 2018-12-20 DIAGNOSIS — I2510 Atherosclerotic heart disease of native coronary artery without angina pectoris: Secondary | ICD-10-CM | POA: Diagnosis not present

## 2018-12-31 DIAGNOSIS — S62102A Fracture of unspecified carpal bone, left wrist, initial encounter for closed fracture: Secondary | ICD-10-CM | POA: Diagnosis not present

## 2019-01-04 ENCOUNTER — Encounter: Payer: Self-pay | Admitting: Family Medicine

## 2019-01-04 DIAGNOSIS — Z5181 Encounter for therapeutic drug level monitoring: Secondary | ICD-10-CM

## 2019-01-04 DIAGNOSIS — E034 Atrophy of thyroid (acquired): Secondary | ICD-10-CM

## 2019-01-07 ENCOUNTER — Encounter: Payer: Self-pay | Admitting: Family Medicine

## 2019-01-09 ENCOUNTER — Encounter: Payer: Self-pay | Admitting: Family Medicine

## 2019-01-09 ENCOUNTER — Other Ambulatory Visit (INDEPENDENT_AMBULATORY_CARE_PROVIDER_SITE_OTHER): Payer: Medicare Other

## 2019-01-09 ENCOUNTER — Ambulatory Visit (INDEPENDENT_AMBULATORY_CARE_PROVIDER_SITE_OTHER): Payer: Medicare Other | Admitting: *Deleted

## 2019-01-09 ENCOUNTER — Other Ambulatory Visit: Payer: Self-pay

## 2019-01-09 DIAGNOSIS — E034 Atrophy of thyroid (acquired): Secondary | ICD-10-CM | POA: Diagnosis not present

## 2019-01-09 DIAGNOSIS — Z23 Encounter for immunization: Secondary | ICD-10-CM

## 2019-01-09 DIAGNOSIS — Z5181 Encounter for therapeutic drug level monitoring: Secondary | ICD-10-CM

## 2019-01-09 DIAGNOSIS — E785 Hyperlipidemia, unspecified: Secondary | ICD-10-CM | POA: Diagnosis not present

## 2019-01-09 LAB — COMPREHENSIVE METABOLIC PANEL
ALT: 21 U/L (ref 0–35)
AST: 22 U/L (ref 0–37)
Albumin: 4.3 g/dL (ref 3.5–5.2)
Alkaline Phosphatase: 87 U/L (ref 39–117)
BUN: 10 mg/dL (ref 6–23)
CO2: 25 mEq/L (ref 19–32)
Calcium: 9.5 mg/dL (ref 8.4–10.5)
Chloride: 100 mEq/L (ref 96–112)
Creatinine, Ser: 0.73 mg/dL (ref 0.40–1.20)
GFR: 78.73 mL/min (ref >60.00)
Glucose, Bld: 87 mg/dL (ref 70–99)
Potassium: 4.4 mEq/L (ref 3.5–5.1)
Sodium: 135 mEq/L (ref 135–145)
Total Bilirubin: 0.4 mg/dL (ref 0.2–1.2)
Total Protein: 6.9 g/dL (ref 6.0–8.3)

## 2019-01-09 LAB — TSH: TSH: 3.23 u[IU]/mL (ref 0.35–4.50)

## 2019-01-09 NOTE — Progress Notes (Signed)
Patient her for high dose flu shot.  Vaccine given and patient tolerated well.

## 2019-01-09 NOTE — Progress Notes (Signed)
Results for orders placed or performed in visit on 01/09/19  TSH  Result Value Ref Range   TSH 3.23 0.35 - 4.50 uIU/mL  Comprehensive metabolic panel  Result Value Ref Range   Sodium 135 135 - 145 mEq/L   Potassium 4.4 3.5 - 5.1 mEq/L   Chloride 100 96 - 112 mEq/L   CO2 25 19 - 32 mEq/L   Glucose, Bld 87 70 - 99 mg/dL   BUN 10 6 - 23 mg/dL   Creatinine, Ser 0.73 0.40 - 1.20 mg/dL   Total Bilirubin 0.4 0.2 - 1.2 mg/dL   Alkaline Phosphatase 87 39 - 117 U/L   AST 22 0 - 37 U/L   ALT 21 0 - 35 U/L   Total Protein 6.9 6.0 - 8.3 g/dL   Albumin 4.3 3.5 - 5.2 g/dL   Calcium 9.5 8.4 - 10.5 mg/dL   GFR 78.73 >60.00 mL/min   Message to patient Can continue current dose of thyroid medication

## 2019-01-10 ENCOUNTER — Telehealth: Payer: Self-pay | Admitting: Emergency Medicine

## 2019-01-10 DIAGNOSIS — M25532 Pain in left wrist: Secondary | ICD-10-CM | POA: Diagnosis not present

## 2019-01-10 LAB — LIPID PANEL
Chol/HDL Ratio: 3.2 ratio (ref 0.0–4.4)
Cholesterol, Total: 179 mg/dL (ref 100–199)
HDL: 56 mg/dL (ref >39)
LDL Calculated: 88 mg/dL (ref 0–99)
Triglycerides: 173 mg/dL — ABNORMAL HIGH (ref 0–149)
VLDL Cholesterol Cal: 35 mg/dL (ref 5–40)

## 2019-01-10 MED ORDER — EZETIMIBE 10 MG PO TABS: 10.0000 mg | ORAL_TABLET | Freq: Every day | ORAL | 1 refills | Status: DC

## 2019-01-10 NOTE — Telephone Encounter (Signed)
Patient informed of results of lab work and advised to start Zetia 10 mg daily per Dr. Agustin Cree. Patient verbally understands. No further questions.

## 2019-01-29 DIAGNOSIS — S62102D Fracture of unspecified carpal bone, left wrist, subsequent encounter for fracture with routine healing: Secondary | ICD-10-CM | POA: Diagnosis not present

## 2019-02-26 DIAGNOSIS — H34832 Tributary (branch) retinal vein occlusion, left eye, with macular edema: Secondary | ICD-10-CM | POA: Diagnosis not present

## 2019-02-26 DIAGNOSIS — H35352 Cystoid macular degeneration, left eye: Secondary | ICD-10-CM | POA: Diagnosis not present

## 2019-02-26 DIAGNOSIS — Z961 Presence of intraocular lens: Secondary | ICD-10-CM | POA: Diagnosis not present

## 2019-03-03 ENCOUNTER — Other Ambulatory Visit: Payer: Self-pay | Admitting: Family Medicine

## 2019-03-07 ENCOUNTER — Other Ambulatory Visit: Payer: Self-pay | Admitting: Family Medicine

## 2019-03-08 NOTE — Patient Instructions (Addendum)
It was nice to see you again today Please consider having the shingles vaccine given at your drugstore Please have a mammogram as soon as you feel comfortable- I can order this test if need be  Your BP is borderline low - I will ask your cardiologist about decreasing your beta blocker if possible  I am concerned about the vaginal bleeding you have noticed. Hopefully this is just from the skin, but we need to make sure and rule out endometrial cancer especially before starting any estrogen.  I am going to set you up with a GYN practice here in [facility] for this purpose   Take care, please see me in about 6 months

## 2019-03-08 NOTE — Progress Notes (Addendum)
[facility] [address], [address] [place], [place] [address] [phone] [phone]  Date:  03/10/2019   Name:  [name] Barnes   DOB:  02-17-1951   MRN:  [id number]  PCP:  Pearline Cablesopland, Jessica C, MD    Chief Complaint: Medication Refill (thyroid mediation)   History of Present Illness:  [name] Study is a 68 y.o. very pleasant female patient who presents with the following:  In person visit today for medication review Patient with history of NSTEMI and CABG in 2019, hypothyroidism, dyslipidemia, GERD, borderline prediabetes Last seen by myself in December of last year-she had an office evaluation at that time, troponin was high.  She was sent to the hospital and treated with CABG as above  She had a TSH, lipids and metabolic profile in August, looked fine She also fell and broke her wrist in July- she had surgery and did well  We discussed her wrist fracture   She was seen by her cardiologist in March of this year:  1. Dyslipidemia I advised to continue with 80 Lipitor will check her fasting lipid profile.  I told her that her LDL have to be less than 70 2. Pain in the right lower extremity with some jerking movement.  I will ask to have arterial duplex on the leg. 3. Is post coronary bypass graft with LIMA to LAD SVG to diagonal SVG to obtuse marginal SVG to RCA. 4. Bilateral carotid bruit ultrasounds has been done which showed lesions up to 39%.  Labs- see chart  Mammogram- pt declines for now due to covid Flu shot-given in August DEXA given last year- she declines treatment for her osteopenia/ increased fracture risk  Pneumonia series up-to-date  She notes that she may have hot and cold flashes She may get some yeast infection under her breasts  Sometimes she feels like her skin is burning and "on fire"  She notes some vaginal irritation and itching- but no yeast like discharge  She has a tube of premarin cream at home that she is  using already for the last 3 months- she is not sure if she should continue to use this.  She notes that she got a pessary about 2 years ago, since then she has no more vaginal irritation and dryness.  However she also notes that she has occasional light bleeding or spotting vaginally.  She does still have her uterus   Lab Results  Component Value Date   TSH 3.23 01/09/2019    Patient Active Problem List   Diagnosis Date Noted  . S/P CABG x 4 04/15/18 04/17/2018  . NSTEMI (non-ST elevated myocardial infarction) (HCC) 04/15/2018  . Bilateral carotid bruits   . Systolic murmur   . Osteopenia 11/30/2017  . GERD (gastroesophageal reflux disease) 06/11/2016  . Osteoarthritis 06/11/2016  . Hypothyroidism (acquired) 06/11/2016  . Dyslipidemia 06/11/2016  . HSV-2 (herpes simplex virus 2) infection 06/11/2016    Past Medical History:  Diagnosis Date  . Arthritis   . Chicken pox   . Dyslipidemia   . Genital herpes   . GERD (gastroesophageal reflux disease)   . Hepatitis B   . Hypothyroidism   . Kidney stone   . Pancreatitis     Past Surgical History:  Procedure Laterality Date  . APPENDECTOMY  1972  . CORONARY ARTERY BYPASS GRAFT N/A 04/17/2018   Procedure: CORONARY ARTERY BYPASS GRAFTING (CABG) TIMES USING LEFT INTERNAL MAMMARY ARTERY TO LAD AND SAPHENOUS VEIN  TO DIAG 1, OM1, AND RCA;  Surgeon: Delight Ovens, MD;  Location: [facility] OR;  Service: Open Heart Surgery;  Laterality: N/A;  . ENDOVEIN HARVEST OF GREATER SAPHENOUS VEIN Right 04/17/2018   Procedure: ENDOVEIN HARVEST OF GREATER SAPHENOUS VEIN RIGHT THIGH AND CALF;  Surgeon: Delight Ovens, MD;  Location: [facility] OR;  Service: Open Heart Surgery;  Laterality: Right;  . LAPAROSCOPY    . LEFT HEART CATH AND CORONARY ANGIOGRAPHY N/A 04/16/2018   Procedure: LEFT HEART CATH AND CORONARY ANGIOGRAPHY;  Surgeon: Kathleene Hazel, MD;  Location: [facility] INVASIVE CV LAB;  Service: Cardiovascular;  Laterality: N/A;  . OVARIAN CYST  SURGERY    . TEE WITHOUT CARDIOVERSION N/A 04/17/2018   Procedure: TRANSESOPHAGEAL ECHOCARDIOGRAM (TEE);  Surgeon: Delight Ovens, MD;  Location: [facility] OR;  Service: Open Heart Surgery;  Laterality: N/A;  . TONSILLECTOMY      Social History   Tobacco Use  . Smoking status: Former Games developer  . Smokeless tobacco: Never Used  Substance Use Topics  . Alcohol use: No  . Drug use: No    Family History  Problem Relation Age of Onset  . Alcoholism Mother   . Kidney disease Mother   . Kidney Stones Mother   . Liver cancer Mother   . Alcoholism Father   . Hyperlipidemia Father   . Heart disease Father   . Heart failure Father   . Alcoholism Maternal Grandfather     Allergies  Allergen Reactions  . Dye Fdc Blue  [Brilliant Blue Fcf] Anaphylaxis    Dye used for CAT scans  . Contrast Media [Iodinated Diagnostic Agents]   . Scopolamine Anxiety and Other (See Comments)    Medication list has been reviewed and updated.  Current Outpatient Medications on File Prior to Visit  Medication Sig Dispense Refill  . acetaminophen (TYLENOL) 500 MG tablet Take 500 mg by mouth every 6 (six) hours as needed.    Marland Kitchen acyclovir (ZOVIRAX) 400 MG tablet Take 1 tablet (400 mg total) by mouth 2 (two) times daily. 180 tablet 3  . aspirin EC 325 MG tablet Take 1 tablet (325 mg total) by mouth daily. 30 tablet 0  . atorvastatin (LIPITOR) 80 MG tablet Take 1 tablet (80 mg total) by mouth daily. 90 tablet 3  . docusate sodium (COLACE) 100 MG capsule Take 100 mg by mouth at bedtime.     Marland Kitchen ezetimibe (ZETIA) 10 MG tablet Take 1 tablet (10 mg total) by mouth daily. 90 tablet 1  . metoprolol tartrate (LOPRESSOR) 25 MG tablet Take 0.5 tablets (12.5 mg total) by mouth 2 (two) times daily. 30 tablet 11  . omeprazole (PRILOSEC) 40 MG capsule Take 1 capsule (40 mg total) by mouth daily. 90 capsule 3   No current facility-administered medications on file prior to visit.     Review of Systems:  As per HPI- otherwise  negative. No CP or SOB No fever or chills   Physical Examination: Vitals:   03/10/19 1251  BP: (!) 100/56  Pulse: 69  Resp: 12  Temp: (!) 96.6 F (35.9 C)  SpO2: 99%   Vitals:   03/10/19 1251  Weight: 165 lb 12.8 oz (75.2 kg)  Height: 5' (1.524 m)   Body mass index is 32.38 kg/m. Ideal Body Weight: Weight in (lb) to have BMI = 25: 127.7  GEN: WDWN, NAD, Non-toxic, A & O x 3, obese, looks well  HEENT: Atraumatic, Normocephalic. Neck supple. No masses, No LAD. Ears and Nose:  No external deformity. CV: RRR, No M/G/R. No JVD. No thrill. No extra heart sounds. PULM: CTA B, no wheezes, crackles, rhonchi. No retractions. No resp. distress. No accessory muscle use. ABD: S, NT, ND, +BS. No rebound. No HSM. EXTR: No c/c/e NEURO Normal gait.  PSYCH: Normally interactive. Conversant. Not depressed or anxious appearing.  Calm demeanor.  External genitalia appears relatively normal, mild atrophy but not severe  Assessment and Plan: Hypothyroidism due to acquired atrophy of thyroid - Plan: levothyroxine (EUTHYROX) 88 MCG tablet  Hyperlipidemia, unspecified hyperlipidemia type  NSTEMI (non-ST elevated myocardial infarction) ([place])  PMB (postmenopausal bleeding) - Plan: Ambulatory referral to Obstetrics / Gynecology  Discussed her PMB-explained that endometrial cancer needs to be ruled out in all cases.  She is somewhat reluctant but accepts a referral to GYN.  Advised her that we will not start her on more estrogen until she is evaluated.  Her if she wants to continue using the estrogen cream she has at home, this should be okay in the interim  Refilled her thyroid medication  Signed Lamar Blinks, MD

## 2019-03-10 ENCOUNTER — Encounter: Payer: Self-pay | Admitting: Family Medicine

## 2019-03-10 ENCOUNTER — Other Ambulatory Visit: Payer: Self-pay

## 2019-03-10 ENCOUNTER — Ambulatory Visit (INDEPENDENT_AMBULATORY_CARE_PROVIDER_SITE_OTHER): Payer: Medicare Other | Admitting: Family Medicine

## 2019-03-10 VITALS — BP 100/56 | HR 69 | Temp 96.6°F | Resp 12 | Ht 60.0 in | Wt 165.8 lb

## 2019-03-10 DIAGNOSIS — N950 Postmenopausal bleeding: Secondary | ICD-10-CM | POA: Diagnosis not present

## 2019-03-10 DIAGNOSIS — I2510 Atherosclerotic heart disease of native coronary artery without angina pectoris: Secondary | ICD-10-CM

## 2019-03-10 DIAGNOSIS — E785 Hyperlipidemia, unspecified: Secondary | ICD-10-CM | POA: Diagnosis not present

## 2019-03-10 DIAGNOSIS — I214 Non-ST elevation (NSTEMI) myocardial infarction: Secondary | ICD-10-CM | POA: Diagnosis not present

## 2019-03-10 DIAGNOSIS — E034 Atrophy of thyroid (acquired): Secondary | ICD-10-CM | POA: Diagnosis not present

## 2019-03-10 MED ORDER — LEVOTHYROXINE SODIUM 88 MCG PO TABS: ORAL_TABLET | ORAL | 3 refills | Status: DC

## 2019-03-11 ENCOUNTER — Encounter: Payer: Self-pay | Admitting: Family Medicine

## 2019-03-12 ENCOUNTER — Encounter: Payer: Self-pay | Admitting: Family Medicine

## 2019-03-12 DIAGNOSIS — S62102D Fracture of unspecified carpal bone, left wrist, subsequent encounter for fracture with routine healing: Secondary | ICD-10-CM | POA: Diagnosis not present

## 2019-04-02 DIAGNOSIS — Z961 Presence of intraocular lens: Secondary | ICD-10-CM | POA: Diagnosis not present

## 2019-04-02 DIAGNOSIS — H35352 Cystoid macular degeneration, left eye: Secondary | ICD-10-CM | POA: Diagnosis not present

## 2019-04-02 DIAGNOSIS — H34832 Tributary (branch) retinal vein occlusion, left eye, with macular edema: Secondary | ICD-10-CM | POA: Diagnosis not present

## 2019-04-03 ENCOUNTER — Encounter: Payer: Self-pay | Admitting: Obstetrics & Gynecology

## 2019-04-03 ENCOUNTER — Ambulatory Visit (INDEPENDENT_AMBULATORY_CARE_PROVIDER_SITE_OTHER): Payer: Medicare Other | Admitting: Obstetrics & Gynecology

## 2019-04-03 ENCOUNTER — Other Ambulatory Visit: Payer: Self-pay

## 2019-04-03 VITALS — BP 106/55 | HR 76 | Ht 60.0 in | Wt 165.0 lb

## 2019-04-03 DIAGNOSIS — N814 Uterovaginal prolapse, unspecified: Secondary | ICD-10-CM

## 2019-04-03 DIAGNOSIS — I2510 Atherosclerotic heart disease of native coronary artery without angina pectoris: Secondary | ICD-10-CM

## 2019-04-03 DIAGNOSIS — N950 Postmenopausal bleeding: Secondary | ICD-10-CM

## 2019-04-03 MED ORDER — MISOPROSTOL 200 MCG PO TABS: ORAL_TABLET | ORAL | 0 refills | Status: DC

## 2019-04-03 NOTE — Progress Notes (Signed)
History:  68 y.o. G2P1011 here today for eval of her pessary. She reports that she normally removes it and changes it herself but, she reports that she had brown discharge that looks like old blood several; times. Once even prompting her to remove the pessary early. She is not sure if its related to the pessary itself or something else.  She has removed it but, not for more than 1-2 days. She denies recent note of this brown discharge. She denies frank bright red blood.       The following portions of the patient's history were reviewed and updated as appropriate: allergies, current medications, past family history, past medical history, past social history, past surgical history and problem list.  Review of Systems:  Pertinent items are noted in HPI.    Objective:  Physical Exam Blood pressure (!) 106/55, pulse 76, height 5' (1.524 m), weight 165 lb (74.8 kg).  CONSTITUTIONAL: Well-developed, well-nourished female in no acute distress.  HENT:  Normocephalic, atraumatic EYES: Conjunctivae and EOM are normal. No scleral icterus.  NECK: Normal range of motion SKIN: Skin is warm and dry. No rash noted. Not diaphoretic.No pallor. [place]: Alert and oriented to person, place, and time. Normal coordination.   GYN: A bimanual reveal a pessary that is properly fitted. Pessary removed and cleaned; vaginal mucosa is red and beefy There is a laceration in the left post side of the vault that is not actively bleeding. The cervix is red by no lesions are noted.  The uterus is small and mobile and prolapsed to the introitus.     Assessment:     Pessary check  PMPB - pt wants [place] to assess [place] prior to agreeing to bx.     Plan:     F/u in 6 days to reexamine pt and to have pessary replaced  F/u sooner prn  TV US prior to next visit If endometrial stripe >35mm rec Endo Bx.  It pt needs bx will take Cytotec prior to the procedure.     Sephora L. Harraway-Smith, M.D., Cherlynn June

## 2019-04-03 NOTE — Progress Notes (Signed)
Pt states she has been having some abnormal bleeding since she started using the pessary two years ago.

## 2019-04-04 ENCOUNTER — Ambulatory Visit (HOSPITAL_BASED_OUTPATIENT_CLINIC_OR_DEPARTMENT_OTHER): Payer: Medicare Other

## 2019-04-04 ENCOUNTER — Ambulatory Visit (HOSPITAL_BASED_OUTPATIENT_CLINIC_OR_DEPARTMENT_OTHER)
Admission: RE | Admit: 2019-04-04 | Discharge: 2019-04-04 | Disposition: A | Discharge status: Home / Self Care | Payer: Medicare Other | Source: Ambulatory Visit | Attending: Obstetrics & Gynecology | Admitting: Obstetrics & Gynecology

## 2019-04-04 ENCOUNTER — Encounter (HOSPITAL_BASED_OUTPATIENT_CLINIC_OR_DEPARTMENT_OTHER): Payer: Self-pay

## 2019-04-04 DIAGNOSIS — N950 Postmenopausal bleeding: Secondary | ICD-10-CM | POA: Diagnosis not present

## 2019-04-04 DIAGNOSIS — N939 Abnormal uterine and vaginal bleeding, unspecified: Secondary | ICD-10-CM | POA: Diagnosis not present

## 2019-04-09 ENCOUNTER — Encounter: Payer: Self-pay | Admitting: Obstetrics & Gynecology

## 2019-04-09 ENCOUNTER — Ambulatory Visit: Payer: Medicare Other | Admitting: Cardiology

## 2019-04-09 ENCOUNTER — Other Ambulatory Visit: Payer: Self-pay

## 2019-04-09 ENCOUNTER — Ambulatory Visit (INDEPENDENT_AMBULATORY_CARE_PROVIDER_SITE_OTHER): Payer: Medicare Other | Admitting: Obstetrics & Gynecology

## 2019-04-09 VITALS — BP 123/74 | HR 73 | Ht 60.0 in | Wt 168.0 lb

## 2019-04-09 DIAGNOSIS — N819 Female genital prolapse, unspecified: Secondary | ICD-10-CM | POA: Diagnosis not present

## 2019-04-09 DIAGNOSIS — I2510 Atherosclerotic heart disease of native coronary artery without angina pectoris: Secondary | ICD-10-CM | POA: Diagnosis not present

## 2019-04-09 DIAGNOSIS — S3141XD Laceration without foreign body of vagina and vulva, subsequent encounter: Secondary | ICD-10-CM

## 2019-04-09 NOTE — Progress Notes (Signed)
Pt states bleeding has stopped.  chiquita l wilson, CMA

## 2019-04-09 NOTE — Progress Notes (Signed)
History:  68 y.o. G2P1011 here today for a pessary check. Pt was seen 04/03/2019 after she took out jer pessary and noted bleeding. She reports that after her visit she has some brownish discharge that has completely stopped. The itching she was hang resolved completely.   Pt wants her pessary reinserted today.   She wants to cont to remove and replace her pessary herself. Pt reports using only water to replace her pessary. She reports that she was using EES cream but, it was too expensive to continue but, she does feel that it helped.      The following portions of the patient's history were reviewed and updated as appropriate: allergies, current medications, past family history, past medical history, past social history, past surgical history and problem list.  Review of Systems:  Pertinent items are noted in HPI.    Objective:  Physical Exam Blood pressure 123/74, pulse 73, height 5' (1.524 m), weight 168 lb (76.2 kg).  CONSTITUTIONAL: Well-developed, well-nourished female in no acute distress.  HENT:  Normocephalic, atraumatic EYES: Conjunctivae and EOM are normal. No scleral icterus.  NECK: Normal range of motion SKIN: Skin is warm and dry. No rash noted. Not diaphoretic.No pallor. [place]: Alert and oriented to person, place, and time. Normal coordination.  Abd: Soft, nontender and nondistended Pelvic: Normal appearing external genitalia; normal appearing vaginal mucosa and cervix.  Normal discharge.  The vaginal tissue is completely healed. No laceration noted. The pessary was reinserted.   Labs and Imaging US Pelvic Complete With Transvaginal  Result Date: 04/04/2019 CLINICAL DATA:  Sporadic dark brown vaginal spotting for 1 year, had pessary removed yesterday due to tear and vaginal wall, postmenopausal, postmenopausal bleeding EXAM: TRANSABDOMINAL AND TRANSVAGINAL ULTRASOUND OF PELVIS TECHNIQUE: Both transabdominal and transvaginal ultrasound examinations of the pelvis were  performed. Transabdominal technique was performed for global imaging of the pelvis including uterus, ovaries, adnexal regions, and pelvic cul-de-sac. It was necessary to proceed with endovaginal exam following the transabdominal exam to visualize the uterus, endometrium, and ovaries. COMPARISON:  None FINDINGS: Uterus Measurements: 5.2 x 1.9 x 3.0 cm = volume: 15 mL. Anteverted. Slightly heterogeneous myometrium without mass. Incidentally noted small nabothian cyst at cervix. Endometrium Thickness: 2 mm.  Small amount of nonspecific endometrial fluid. Right ovary Not visualized on either transabdominal or endovaginal imaging, question due to atrophy versus obscured by bowel Left ovary Not visualized on either transabdominal or endovaginal imaging, question due to atrophy versus obscured by bowel Other findings No free pelvic fluid.  No adnexal masses. IMPRESSION: Nonvisualization of ovaries. Normal thickness of endometrial complex 2 mm thick with small amount of nonspecific endometrial fluid. In the setting of post-menopausal bleeding, this is consistent with a benign etiology such as endometrial atrophy. If bleeding remains unresponsive to hormonal or medical therapy, sonohysterogram should be considered for focal lesion work-up. (Ref: Radiological Reasoning: Algorithmic Workup of Abnormal Vaginal Bleeding with Endovaginal Sonography and Sonohysterography. AJR 2008; 254:Y70-62) Electronically Signed   By: Lavonia Dana M.D.   On: 04/04/2019 17:15    Assessment & Plan:  Vaginal injury after pessary insertion- improved   Pessary reinserted.  Rec lubrication with pessary insertion Topical EES called to compounding pharmacy. They will send dosage to pt. rec 3 month f/u. Pts works for immunocompromised pt and she is reluctant to come in. Pt told that if she is without sx she can forg that appt.   PMPB- the endometrial stripe is 2 mm which supports that this was likely all vaginal and not  uterine. Pt offered  endo bx vs observation. Pt will f/u if bleeding returns.  Total face-to-face time with patient was 15 min.  Greater than 50% was spent in counseling and coordination of care with the patient.   Shanedra L. Harraway-Smith, M.D., [facility]

## 2019-04-09 NOTE — Patient Instructions (Signed)
Pelvic Organ Prolapse °Pelvic organ prolapse is the stretching, bulging, or dropping of pelvic organs into an abnormal position. It happens when the muscles and tissues that surround and support pelvic structures become weak or stretched. Pelvic organ prolapse can involve the: °· Vagina (vaginal prolapse). °· Uterus (uterine prolapse). °· Bladder (cystocele). °· Rectum (rectocele). °· Intestines (enterocele). °When organs other than the vagina are involved, they often bulge into the vagina or protrude from the vagina, depending on how severe the prolapse is. °What are the causes? °This condition may be caused by: °· Pregnancy, labor, and childbirth. °· Past pelvic surgery. °· Decreased production of the hormone estrogen associated with menopause. °· Consistently lifting more than 50 lb (23 kg). °· Obesity. °· Long-term inability to pass stool (chronic constipation). °· A cough that lasts a long time (chronic). °· Buildup of fluid in the abdomen due to certain diseases and other conditions. °What are the signs or symptoms? °Symptoms of this condition include: °· Passing a little urine (loss of bladder control) when you cough, sneeze, strain, and exercise (stress incontinence). This may be worse immediately after childbirth. It may gradually improve over time. °· Feeling pressure in your pelvis or vagina. This pressure may increase when you cough or when you are passing stool. °· A bulge that protrudes from the opening of your vagina. °· Difficulty passing urine or stool. °· Pain in your lower back. °· Pain, discomfort, or disinterest in sex. °· Repeated bladder infections (urinary tract infections). °· Difficulty inserting a tampon. °In some people, this condition causes no symptoms. °How is this diagnosed? °This condition may be diagnosed based on a vaginal and rectal exam. During the exam, you may be asked to cough and strain while you are lying down, sitting, and standing up. Your health care provider will  determine if other tests are required, such as bladder function tests. °How is this treated? °Treatment for this condition may depend on your symptoms. Treatment may include: °· Lifestyle changes, such as changes to your diet. °· Emptying your bladder at scheduled times (bladder training therapy). This can help reduce or avoid urinary incontinence. °· Estrogen. Estrogen may help mild prolapse by increasing the strength and tone of pelvic floor muscles. °· Kegel exercises. These may help mild cases of prolapse by strengthening and tightening the muscles of the pelvic floor. °· A soft, flexible device that helps support the vaginal walls and keep pelvic organs in place (pessary). This is inserted into your vagina by your health care provider. °· Surgery. This is often the only form of treatment for severe prolapse. °Follow these instructions at home: °· Avoid drinking beverages that contain caffeine or alcohol. °· Increase your intake of high-fiber foods. This can help decrease constipation and straining during bowel movements. °· Lose weight if recommended by your health care provider. °· Wear a sanitary pad or adult diapers if you have urinary incontinence. °· Avoid heavy lifting and straining with exercise and work. Do not hold your breath when you perform mild to moderate lifting and exercise activities. Limit your activities as directed by your health care provider. °· Do Kegel exercises as directed by your health care provider. To do this: °? Squeeze your pelvic floor muscles tight. You should feel a tight lift in your rectal area and a tightness in your vaginal area. Keep your stomach, buttocks, and legs relaxed. °? Hold the muscles tight for up to 10 seconds. °? Relax your muscles. °? Repeat this exercise 50 times a day,   or as many times as told by your health care provider. Continue to do this exercise for at least 4-6 weeks, or for as long as told by your health care provider. °· Take over-the-counter and  prescription medicines only as told by your health care provider. °· If you have a pessary, take care of it as told by your health care provider. °· Keep all follow-up visits as told by your health care provider. This is important. °Contact a health care provider if you: °· Have symptoms that interfere with your daily activities or sex life. °· Need medicine to help with the discomfort. °· Notice bleeding from your vagina that is not related to your period. °· Have a fever. °· Have pain or bleeding when you urinate. °· Have bleeding when you pass stool. °· Pass urine when you have sex. °· Have chronic constipation. °· Have a pessary that falls out. °· Have bad smelling vaginal discharge. °· Have an unusual, low pain in your abdomen. °Summary °· Pelvic organ prolapse is the stretching, bulging, or dropping of pelvic organs into an abnormal position. It happens when the muscles and tissues that surround and support pelvic structures become weak or stretched. °· When organs other than the vagina are involved, they often bulge into the vagina or protrude from the vagina, depending on how severe the prolapse is. °· In most cases, this condition needs to be treated only if it produces symptoms. Treatment may include lifestyle changes, estrogen, Kegel exercises, pessary insertion, or surgery. °· Avoid heavy lifting and straining with exercise and work. Do not hold your breath when you perform mild to moderate lifting and exercise activities. Limit your activities as directed by your health care provider. °This information is not intended to replace advice given to you by your health care provider. Make sure you discuss any questions you have with your health care provider. °Document Released: 11/26/2013 Document Revised: 05/23/2017 Document Reviewed: 05/23/2017 °Elsevier Patient Education © 2020 Elsevier Inc. ° °

## 2019-04-14 ENCOUNTER — Ambulatory Visit (INDEPENDENT_AMBULATORY_CARE_PROVIDER_SITE_OTHER): Payer: Medicare Other | Admitting: Cardiology

## 2019-04-14 ENCOUNTER — Other Ambulatory Visit: Payer: Self-pay

## 2019-04-14 ENCOUNTER — Encounter: Payer: Self-pay | Admitting: Cardiology

## 2019-04-14 VITALS — BP 100/70 | HR 60 | Ht 60.0 in | Wt 168.0 lb

## 2019-04-14 DIAGNOSIS — R0989 Other specified symptoms and signs involving the circulatory and respiratory systems: Secondary | ICD-10-CM | POA: Diagnosis not present

## 2019-04-14 DIAGNOSIS — E785 Hyperlipidemia, unspecified: Secondary | ICD-10-CM

## 2019-04-14 DIAGNOSIS — Z951 Presence of aortocoronary bypass graft: Secondary | ICD-10-CM

## 2019-04-14 DIAGNOSIS — I214 Non-ST elevation (NSTEMI) myocardial infarction: Secondary | ICD-10-CM | POA: Diagnosis not present

## 2019-04-14 DIAGNOSIS — K219 Gastro-esophageal reflux disease without esophagitis: Secondary | ICD-10-CM | POA: Diagnosis not present

## 2019-04-14 MED ORDER — EZETIMIBE 10 MG PO TABS: 10.0000 mg | ORAL_TABLET | Freq: Every day | ORAL | 1 refills | Status: DC

## 2019-04-14 NOTE — Patient Instructions (Signed)
Medication Instructions:  Your physician recommends that you continue on your current medications as directed. Please refer to the Current Medication list given to you today.  *If you need a refill on your cardiac medications before your next appointment, please call your pharmacy*  Lab Work: Your physician recommends that you return for lab work today: lipids   If you have labs (blood work) drawn today and your tests are completely normal, you will receive your results only by: Marland Kitchen MyChart Message (if you have MyChart) OR . A paper copy in the mail If you have any lab test that is abnormal or we need to change your treatment, we will call you to review the results.  Testing/Procedures: Your physician has requested that you have a carotid duplex. This test is an ultrasound of the carotid arteries in your neck. It looks at blood flow through these arteries that supply the brain with blood. Allow one hour for this exam. There are no restrictions or special instructions.  Your physician has requested that you have an echocardiogram. Echocardiography is a painless test that uses sound waves to create images of your heart. It provides your doctor with information about the size and shape of your heart and how well your heart's chambers and valves are working. This procedure takes approximately one hour. There are no restrictions for this procedure.    Follow-Up: At [facility], you and your health needs are our priority.  As part of our continuing mission to provide you with exceptional heart care, we have created designated Provider Care Teams.  These Care Teams include your primary Cardiologist (physician) and Advanced Practice Providers (APPs -  Physician Assistants and Nurse Practitioners) who all work together to provide you with the care you need, when you need it.  Your next appointment:   6 month(s)  The format for your next appointment:   In Person  Provider:   You may see Dr.  Bing Matter or the following Advanced Practice Provider on your designated Care Team:    Gillian Shields, FNP   Other Instructions   Echocardiogram An echocardiogram is a procedure that uses painless sound waves (ultrasound) to produce an image of the heart. Images from an echocardiogram can provide important information about:  Signs of coronary artery disease (CAD).  Aneurysm detection. An aneurysm is a weak or damaged part of an artery wall that bulges out from the normal force of blood pumping through the body.  Heart size and shape. Changes in the size or shape of the heart can be associated with certain conditions, including heart failure, aneurysm, and CAD.  Heart muscle function.  Heart valve function.  Signs of a past heart attack.  Fluid buildup around the heart.  Thickening of the heart muscle.  A tumor or infectious growth around the heart valves. Tell a health care provider about:  Any allergies you have.  All medicines you are taking, including vitamins, herbs, eye drops, creams, and over-the-counter medicines.  Any blood disorders you have.  Any surgeries you have had.  Any medical conditions you have.  Whether you are pregnant or may be pregnant. What are the risks? Generally, this is a safe procedure. However, problems may occur, including:  Allergic reaction to dye (contrast) that may be used during the procedure. What happens before the procedure? No specific preparation is needed. You may eat and drink normally. What happens during the procedure?   An IV tube may be inserted into one of your veins.  You may receive contrast through this tube. A contrast is an injection that improves the quality of the pictures from your heart.  A gel will be applied to your chest.  A wand-like tool (transducer) will be moved over your chest. The gel will help to transmit the sound waves from the transducer.  The sound waves will harmlessly bounce off of your  heart to allow the heart images to be captured in real-time motion. The images will be recorded on a computer. The procedure may vary among health care providers and hospitals. What happens after the procedure?  You may return to your normal, everyday life, including diet, activities, and medicines, unless your health care provider tells you not to do that. Summary  An echocardiogram is a procedure that uses painless sound waves (ultrasound) to produce an image of the heart.  Images from an echocardiogram can provide important information about the size and shape of your heart, heart muscle function, heart valve function, and fluid buildup around your heart.  You do not need to do anything to prepare before this procedure. You may eat and drink normally.  After the echocardiogram is completed, you may return to your normal, everyday life, unless your health care provider tells you not to do that. This information is not intended to replace advice given to you by your health care provider. Make sure you discuss any questions you have with your health care provider. Document Released: 04/28/2000 Document Revised: 08/22/2018 Document Reviewed: 06/03/2016 Elsevier Patient Education  2020 [employer].

## 2019-04-14 NOTE — Progress Notes (Signed)
Cardiology Office Note:    Date:  04/14/2019   ID:  [name], DOB [birth year], MRN [id number]  PCP:  Pearline Cablesopland, Jessica C, MD  Cardiologist:  Gypsy Balsamobert Khy Pitre, MD    Referring MD: Pearline Cablesopland, Jessica C, MD   Chief Complaint  Patient presents with  . Follow-up  Doing well  History of Present Illness:    Kimberly LutesCarolyn Barnes is a 68 y.o. female with coronary disease status post coronary bypass graft in April 15, 2018.  She comes today to my office for follow-up.  Overall she seems to be doing well.  Denies having a chest pain, tightness, pressure, burning the chest.  She is frustrated with coronavirus situation that she is sitting at home all the time and watching [employer] channel.  But overall seems to be doing well cardiac wise.  Past Medical History:  Diagnosis Date  . Arthritis   . Chicken pox   . Dyslipidemia   . Genital herpes   . GERD (gastroesophageal reflux disease)   . Hepatitis B   . Hypothyroidism   . Kidney stone   . Pancreatitis     Past Surgical History:  Procedure Laterality Date  . APPENDECTOMY  1972  . CORONARY ARTERY BYPASS GRAFT N/A 04/17/2018   Procedure: CORONARY ARTERY BYPASS GRAFTING (CABG) TIMES USING LEFT INTERNAL MAMMARY ARTERY TO LAD AND SAPHENOUS VEIN TO DIAG 1, OM1, AND RCA;  Surgeon: Delight OvensGerhardt, Edward B, MD;  Location: [facility] OR;  Service: Open Heart Surgery;  Laterality: N/A;  . ENDOVEIN HARVEST OF GREATER SAPHENOUS VEIN Right 04/17/2018   Procedure: ENDOVEIN HARVEST OF GREATER SAPHENOUS VEIN RIGHT THIGH AND CALF;  Surgeon: Delight OvensGerhardt, Edward B, MD;  [facility];  Service: Open Heart Surgery;  Laterality: Right;  . LAPAROSCOPY    . LEFT HEART CATH AND CORONARY ANGIOGRAPHY N/A 04/16/2018   Procedure: LEFT HEART CATH AND CORONARY ANGIOGRAPHY;  Surgeon: Kathleene HazelMcAlhany, Christopher D, MD;  Location: [facility] INVASIVE CV LAB;  Service: Cardiovascular;  Laterality: N/A;  . OVARIAN CYST SURGERY    . TEE WITHOUT CARDIOVERSION N/A 04/17/2018   Procedure: TRANSESOPHAGEAL  ECHOCARDIOGRAM (TEE);  Surgeon: Delight OvensGerhardt, Edward B, MD;  [facility];  Service: Open Heart Surgery;  Laterality: N/A;  . TONSILLECTOMY      Current Medications: Current Meds  Medication Sig  . acetaminophen (TYLENOL) 500 MG tablet Take 500 mg by mouth every 6 (six) hours as needed.  Marland Kitchen. acyclovir (ZOVIRAX) 400 MG tablet Take 1 tablet (400 mg total) by mouth 2 (two) times daily.  Marland Kitchen. aspirin EC 325 MG tablet Take 1 tablet (325 mg total) by mouth daily.  Marland Kitchen. atorvastatin (LIPITOR) 80 MG tablet Take 1 tablet (80 mg total) by mouth daily.  Marland Kitchen. docusate sodium (COLACE) 100 MG capsule Take 100 mg by mouth at bedtime.   Marland Kitchen. ezetimibe (ZETIA) 10 MG tablet Take 1 tablet (10 mg total) by mouth daily.  Marland Kitchen. levothyroxine (EUTHYROX) 88 MCG tablet TAKE 1 TABLET BY MOUTH ONCE DAILY BEFORE BREAKFAST  . metoprolol tartrate (LOPRESSOR) 25 MG tablet Take 0.5 tablets (12.5 mg total) by mouth 2 (two) times daily.  Marland Kitchen. omeprazole (PRILOSEC) 40 MG capsule Take 1 capsule (40 mg total) by mouth daily.     Allergies:   Dye fdc blue  [brilliant blue fcf], Contrast media [iodinated diagnostic agents], and Scopolamine   Social History   Socioeconomic History  . Marital status: Divorced    Spouse name: Not on file  . Number of children: Not on file  . Years of  education: Not on file  . Highest education level: Not on file  Occupational History  . Not on file  Social Needs  . Financial resource strain: Not on file  . Food insecurity    Worry: Not on file    Inability: Not on file  . Transportation needs    Medical: Not on file    Non-medical: Not on file  Tobacco Use  . Smoking status: Former Research scientist (life sciences)  . Smokeless tobacco: Never Used  Substance and Sexual Activity  . Alcohol use: No  . Drug use: No  . Sexual activity: Never  Lifestyle  . Physical activity    Days per week: Not on file    Minutes per session: Not on file  . Stress: Not on file  Relationships  . Social Herbalist on phone: Not on  file    Gets together: Not on file    Attends religious service: Not on file    Active member of club or organization: Not on file    Attends meetings of clubs or organizations: Not on file    Relationship status: Not on file  Other Topics Concern  . Not on file  Social History Narrative  . Not on file     Family History: The patient's family history includes Alcoholism in her father, maternal grandfather, and mother; Heart disease in her father; Heart failure in her father; Hyperlipidemia in her father; Kidney Stones in her mother; Kidney disease in her mother; Liver cancer in her mother. ROS:   Please see the history of present illness.    All 14 point review of systems negative except as described per history of present illness  EKGs/Labs/Other Studies Reviewed:      Recent Labs: 04/18/2018: Magnesium 2.2 06/24/2018: Hemoglobin 12.5; Platelets 282 01/09/2019: ALT 21; BUN 10; Creatinine, Ser 0.73; Potassium 4.4; Sodium 135; TSH 3.23  Recent Lipid Panel    Component Value Date/Time   CHOL 179 01/09/2019 1037   TRIG 173 (H) 01/09/2019 1037   HDL 56 01/09/2019 1037   CHOLHDL 3.2 01/09/2019 1037   CHOLHDL 3.0 04/16/2018 0229   VLDL 14 04/16/2018 0229   LDLCALC 88 01/09/2019 1037    Physical Exam:    VS:  BP 100/70   Pulse 60   Ht 5' (1.524 m)   Wt 168 lb (76.2 kg)   SpO2 96%   BMI 32.81 kg/m     Wt Readings from Last 3 Encounters:  04/14/19 168 lb (76.2 kg)  04/09/19 168 lb (76.2 kg)  04/03/19 165 lb (74.8 kg)     GEN:  Well nourished, well developed in no acute distress HEENT: Normal NECK: No JVD; bruit heard on the left side.  S LYMPHATICS: No lymphadenopathy CARDIAC: RRR, no murmurs, no rubs, no gallops RESPIRATORY:  Clear to auscultation without rales, wheezing or rhonchi  ABDOMEN: Soft, non-tender, non-distended MUSCULOSKELETAL:  No edema; No deformity  SKIN: Warm and dry LOWER EXTREMITIES: no swelling NEUROLOGIC:  Alert and oriented x 3 PSYCHIATRIC:   Normal affect   ASSESSMENT:    1. S/P CABG x 4 04/15/18   2. Dyslipidemia   3. Bilateral carotid bruits   4. Gastroesophageal reflux disease, unspecified whether esophagitis present    PLAN:    In order of problems listed above:  1. Status post coronary bypass graft doing well from that point review echocardiogram to be scheduled to reassess left ventricle ejection fraction. 2. Dyslipidemia she is on high intensity statin Lipitor  80 as well as Zetia 10 which I will continue today we will check fasting lipid profile. 3. Bilateral carotid bruit time to repeat cardiac ultrasounds which we will do. 4. Gastroesophageal reflux disease denies having a problem from that aspect.   Medication Adjustments/Labs and Tests Ordered: Current medicines are reviewed at length with the patient today.  Concerns regarding medicines are outlined above.  No orders of the defined types were placed in this encounter.  Medication changes: No orders of the defined types were placed in this encounter.   Signed, Georgeanna Lea, MD, [facility] 04/14/2019 11:09 AM    [facility]

## 2019-04-14 NOTE — Addendum Note (Signed)
Addended by: Ashok Norris on: 04/14/2019 11:18 AM   Modules accepted: Orders

## 2019-04-15 LAB — LIPID PANEL
Chol/HDL Ratio: 2.7 ratio (ref 0.0–4.4)
Cholesterol, Total: 162 mg/dL (ref 100–199)
HDL: 59 mg/dL (ref >39)
LDL Chol Calc (NIH): 84 mg/dL (ref 0–99)
Triglycerides: 107 mg/dL (ref 0–149)
VLDL Cholesterol Cal: 19 mg/dL (ref 5–40)

## 2019-04-16 ENCOUNTER — Other Ambulatory Visit: Payer: Self-pay

## 2019-04-16 ENCOUNTER — Ambulatory Visit (HOSPITAL_BASED_OUTPATIENT_CLINIC_OR_DEPARTMENT_OTHER)
Admission: RE | Admit: 2019-04-16 | Discharge: 2019-04-16 | Disposition: A | Discharge status: Home / Self Care | Payer: Medicare Other | Source: Ambulatory Visit | Attending: Cardiology | Admitting: Cardiology

## 2019-04-16 DIAGNOSIS — I214 Non-ST elevation (NSTEMI) myocardial infarction: Secondary | ICD-10-CM | POA: Insufficient documentation

## 2019-04-16 DIAGNOSIS — Z951 Presence of aortocoronary bypass graft: Secondary | ICD-10-CM

## 2019-04-16 DIAGNOSIS — E785 Hyperlipidemia, unspecified: Secondary | ICD-10-CM | POA: Diagnosis not present

## 2019-04-16 DIAGNOSIS — R0989 Other specified symptoms and signs involving the circulatory and respiratory systems: Secondary | ICD-10-CM | POA: Insufficient documentation

## 2019-04-16 NOTE — Progress Notes (Signed)
  Echocardiogram 2D Echocardiogram has been performed.  [name] 04/16/2019, 2:19 PM

## 2019-04-16 NOTE — Progress Notes (Signed)
VAS US CAROTID DUPLEX BILATERAL    04/16/19 Cardell Peach RDCS, RVT

## 2019-04-16 NOTE — Progress Notes (Signed)
  Echocardiogram 2D Echocardiogram has been performed.  [name] 04/16/2019, 2:22 PM

## 2019-04-18 ENCOUNTER — Encounter: Payer: Self-pay | Admitting: Cardiology

## 2019-04-18 ENCOUNTER — Other Ambulatory Visit: Payer: Self-pay

## 2019-04-18 ENCOUNTER — Telehealth (INDEPENDENT_AMBULATORY_CARE_PROVIDER_SITE_OTHER): Payer: Medicare Other | Admitting: Cardiology

## 2019-04-18 VITALS — BP 106/75 | HR 50 | Wt 165.0 lb

## 2019-04-18 DIAGNOSIS — E785 Hyperlipidemia, unspecified: Secondary | ICD-10-CM

## 2019-04-18 DIAGNOSIS — Z951 Presence of aortocoronary bypass graft: Secondary | ICD-10-CM

## 2019-04-18 DIAGNOSIS — I739 Peripheral vascular disease, unspecified: Secondary | ICD-10-CM

## 2019-04-18 DIAGNOSIS — R0989 Other specified symptoms and signs involving the circulatory and respiratory systems: Secondary | ICD-10-CM

## 2019-04-18 HISTORY — DX: Peripheral vascular disease, unspecified: I73.9

## 2019-04-18 NOTE — Progress Notes (Signed)
Virtual Visit via Telephone Note   This visit type was conducted due to national recommendations for restrictions regarding the COVID-19 Pandemic (e.g. social distancing) in an effort to limit this patient's exposure and mitigate transmission in our community.  Due to her co-morbid illnesses, this patient is at least at moderate risk for complications without adequate follow up.  This format is felt to be most appropriate for this patient at this time.  The patient did not have access to video technology/had technical difficulties with video requiring transitioning to audio format only (telephone).  All issues noted in this document were discussed and addressed.  No physical exam could be performed with this format.  Please refer to the patient's chart for her  consent to telehealth for [facility].  Evaluation Performed:  Follow-up visit  This visit type was conducted due to national recommendations for restrictions regarding the COVID-19 Pandemic (e.g. social distancing).  This format is felt to be most appropriate for this patient at this time.  All issues noted in this document were discussed and addressed.  No physical exam was performed (except for noted visual exam findings with Video Visits).  Please refer to the patient's chart (MyChart message for video visits and phone note for telephone visits) for the patient's consent to telehealth for [facility].  Date:  04/18/2019  ID: [name], DOB 1950/11/09, MRN [id number]   Patient Location: [address] [place] [address]   Provider location:   [facility] [place] [facility]  PCP:  Copland, Gwenlyn Found, MD  Cardiologist:  Gypsy Balsam, MD     Chief Complaint: Doing well  History of Present Illness:    [name] is a 68 y.o. female  who presents via audio/video conferencing for a telehealth visit today.  With coronary artery disease, status post coronary artery bypass graft year ago.  She did have  carotic ultrasound done looking at her carotid arteries which likely did not demonstrate any significant stenosis, however, once again we identified significant stenosis in the right subclavian artery.  The purpose of my conversation with her was to talk about it.  She does not have any signs and symptoms of significant coronary subclavian artery stenosis meaning there is no pain in the arm there is no numbness in the arm.  She denies having any signs and symptoms of steal syndrome.  There is no dizziness there is no passing out we talked about the fact that there is no indication to fix her subclavian artery unless there are some symptoms and feels she is very gladly admit that she does not have any symptoms that she is very happy with conservative approach.  Cardiac wise doing well denies having any chest pain tightness squeezing pressure burning chest   The patient does not have symptoms concerning for COVID-19 infection (fever, chills, cough, or new SHORTNESS OF BREATH).    Prior CV studies:   The following studies were reviewed today:  Any significant stenosis within cardiac artery but significant stenosis in the right subclavian artery.     Past Medical History:  Diagnosis Date  . Arthritis   . Chicken pox   . Dyslipidemia   . Genital herpes   . GERD (gastroesophageal reflux disease)   . Hepatitis B   . Hypothyroidism   . Kidney stone   . Pancreatitis     Past Surgical History:  Procedure Laterality Date  . APPENDECTOMY  1972  . CORONARY ARTERY BYPASS GRAFT N/A 04/17/2018  Procedure: CORONARY ARTERY BYPASS GRAFTING (CABG) TIMES USING LEFT INTERNAL MAMMARY ARTERY TO LAD AND SAPHENOUS VEIN TO DIAG 1, OM1, AND RCA;  Surgeon: Grace Isaac, MD;  Location: [place];  Service: Open Heart Surgery;  Laterality: N/A;  . ENDOVEIN HARVEST OF GREATER SAPHENOUS VEIN Right 04/17/2018   Procedure: ENDOVEIN HARVEST OF GREATER SAPHENOUS VEIN RIGHT THIGH AND CALF;  Surgeon: Grace Isaac,  MD;  Location: [place];  Service: Open Heart Surgery;  Laterality: Right;  . LAPAROSCOPY    . LEFT HEART CATH AND CORONARY ANGIOGRAPHY N/A 04/16/2018   Procedure: LEFT HEART CATH AND CORONARY ANGIOGRAPHY;  Surgeon: Burnell Blanks, MD;  Location: [place] [facility];  Service: Cardiovascular;  Laterality: N/A;  . OVARIAN CYST SURGERY    . TEE WITHOUT CARDIOVERSION N/A 04/17/2018   Procedure: TRANSESOPHAGEAL ECHOCARDIOGRAM (TEE);  Surgeon: Grace Isaac, MD;  Location: [place];  Service: Open Heart Surgery;  Laterality: N/A;  . TONSILLECTOMY       Current Meds  Medication Sig  . acetaminophen (TYLENOL) 500 MG tablet Take 500 mg by mouth every 6 (six) hours as needed.  Marland Kitchen acyclovir (ZOVIRAX) 400 MG tablet Take 1 tablet (400 mg total) by mouth 2 (two) times daily.  Marland Kitchen aspirin EC 325 MG tablet Take 1 tablet (325 mg total) by mouth daily.  Marland Kitchen atorvastatin (LIPITOR) 80 MG tablet Take 1 tablet (80 mg total) by mouth daily.  Marland Kitchen docusate sodium (COLACE) 100 MG capsule Take 100 mg by mouth at bedtime.   Marland Kitchen ezetimibe (ZETIA) 10 MG tablet Take 1 tablet (10 mg total) by mouth daily.  Marland Kitchen levothyroxine (EUTHYROX) 88 MCG tablet TAKE 1 TABLET BY MOUTH ONCE DAILY BEFORE BREAKFAST  . metoprolol tartrate (LOPRESSOR) 25 MG tablet Take 0.5 tablets (12.5 mg total) by mouth 2 (two) times daily.  Marland Kitchen omeprazole (PRILOSEC) 40 MG capsule Take 1 capsule (40 mg total) by mouth daily.      Family History: The patient's family history includes Alcoholism in her father, maternal grandfather, and mother; Heart disease in her father; Heart failure in her father; Hyperlipidemia in her father; Kidney Stones in her mother; Kidney disease in her mother; Liver cancer in her mother.   ROS:   Please see the history of present illness.     All other systems reviewed and are negative.   Labs/Other Tests and Data Reviewed:     Recent Labs: 04/18/2018: Magnesium 2.2 06/24/2018: Hemoglobin 12.5; Platelets 282 01/09/2019: ALT  21; BUN 10; Creatinine, Ser 0.73; Potassium 4.4; Sodium 135; TSH 3.23  Recent Lipid Panel    Component Value Date/Time   CHOL 162 04/14/2019 1130   TRIG 107 04/14/2019 1130   HDL 59 04/14/2019 1130   CHOLHDL 2.7 04/14/2019 1130   CHOLHDL 3.0 04/16/2018 0229   VLDL 14 04/16/2018 0229   LDLCALC 84 04/14/2019 1130      Exam:    Vital Signs:  BP 106/75   Pulse (!) 50   Wt 165 lb (74.8 kg)   BMI 32.22 kg/m     Wt Readings from Last 3 Encounters:  04/18/19 165 lb (74.8 kg)  04/14/19 168 lb (76.2 kg)  04/09/19 168 lb (76.2 kg)     Well nourished, well developed in no acute distress. Alert awake and x3 not in any distress, talking to me from her house and I am in our office in [facility]  Diagnosis for this visit:   1. S/P CABG x 4 04/15/18   2.  Peripheral vascular disease, unspecified (HCC) right subclavian artery stenosis   3. Dyslipidemia   4. Bilateral carotid bruits      ASSESSMENT & PLAN:    1.  Status post coronary artery bypass graft asymptomatic doing well on appropriate medication which I will continue. Peripheral vascular disease continue present management.  Asymptomatic right spine artery stenosis. 3.  Dyslipidemia on high intensity statin which I will continue. 4.  Bilateral carotid bruit likely no significant stenosis.  COVID-19 Education: The signs and symptoms of COVID-19 were discussed with the patient and how to seek care for testing (follow up with PCP or arrange E-visit).  The importance of social distancing was discussed today.  Patient Risk:   After full review of this patients clinical status, I feel that they are at least moderate risk at this time.  Time:   Today, I have spent 5 minutes with the patient with telehealth technology discussing pt health issues.  I spent 15 minutes reviewing her chart before the visit.  Visit was finished at 3:39 PM.    Medication Adjustments/Labs and Tests Ordered: Current medicines are reviewed at length  with the patient today.  Concerns regarding medicines are outlined above.  No orders of the defined types were placed in this encounter.  Medication changes: No orders of the defined types were placed in this encounter.    Disposition: Follow-up in 5 months  Signed,  J. , MD, SoutGeorgeanna Leahern [facility] CenterFACC 04/18/2019 3:37 PM    [facility]

## 2019-04-18 NOTE — Patient Instructions (Signed)
Medication Instructions:  None.  *If you need a refill on your cardiac medications before your next appointment, please call your pharmacy*  Lab Work: .None.  If you have labs (blood work) drawn today and your tests are completely normal, you will receive your results only by: Marland Kitchen MyChart Message (if you have MyChart) OR . A paper copy in the mail If you have any lab test that is abnormal or we need to change your treatment, we will call you to review the results.  Testing/Procedures: None.   Follow-Up: At [facility], you and your health needs are our priority.  As part of our continuing mission to provide you with exceptional heart care, we have created designated Provider Care Teams.  These Care Teams include your primary Cardiologist (physician) and Advanced Practice Providers (APPs -  Physician Assistants and Nurse Practitioners) who all work together to provide you with the care you need, when you need it.  Your next appointment:   5 month(s)  The format for your next appointment:   In Person  Provider:   You may see Dr. Agustin Cree or the following Advanced Practice Provider on your designated Care Team:    Laurann Montana, FNP   Other Instructions

## 2019-04-28 ENCOUNTER — Other Ambulatory Visit: Payer: Self-pay

## 2019-04-28 MED ORDER — METOPROLOL TARTRATE 25 MG PO TABS: 12.5000 mg | ORAL_TABLET | Freq: Two times a day (BID) | ORAL | 11 refills | Status: DC

## 2019-04-29 ENCOUNTER — Other Ambulatory Visit: Payer: Self-pay | Admitting: Obstetrics & Gynecology

## 2019-04-29 DIAGNOSIS — N958 Other specified menopausal and perimenopausal disorders: Secondary | ICD-10-CM

## 2019-04-29 MED ORDER — ESTRADIOL 0.1 MG/GM VA CREA: 1.5000 g | TOPICAL_CREAM | Freq: Every day | VAGINAL | 3 refills | Status: DC

## 2019-04-30 ENCOUNTER — Other Ambulatory Visit: Payer: Self-pay | Admitting: *Deleted

## 2019-04-30 MED ORDER — ATORVASTATIN CALCIUM 80 MG PO TABS: 80.0000 mg | ORAL_TABLET | Freq: Every day | ORAL | 1 refills | Status: DC

## 2019-06-10 ENCOUNTER — Telehealth: Payer: Self-pay | Admitting: Family Medicine

## 2019-06-10 DIAGNOSIS — B009 Herpesviral infection, unspecified: Secondary | ICD-10-CM

## 2019-06-10 MED ORDER — ACYCLOVIR 400 MG PO TABS: 400.0000 mg | ORAL_TABLET | Freq: Two times a day (BID) | ORAL | 3 refills | Status: DC

## 2019-06-10 NOTE — Telephone Encounter (Signed)
acyclovir (ZOVIRAX) 400 MG tablet [[id number]]    Pharmacy called to have patient's RX refilled.  [email or web address] - [place], [place] - [address]  [address] [place] [address]  Phone:  [phone] Fax:  [phone]

## 2019-06-10 NOTE — Telephone Encounter (Signed)
Medication refilled and sent to pharmacy.

## 2019-06-15 ENCOUNTER — Ambulatory Visit: Payer: Medicare Other

## 2019-06-20 ENCOUNTER — Ambulatory Visit: Payer: Medicare Other

## 2019-06-21 ENCOUNTER — Ambulatory Visit: Payer: Medicare Other

## 2019-06-30 ENCOUNTER — Ambulatory Visit: Payer: Medicare Other | Admitting: Obstetrics & Gynecology

## 2019-07-06 ENCOUNTER — Ambulatory Visit: Payer: Medicare Other

## 2019-07-09 DIAGNOSIS — Z961 Presence of intraocular lens: Secondary | ICD-10-CM | POA: Diagnosis not present

## 2019-07-09 DIAGNOSIS — H35352 Cystoid macular degeneration, left eye: Secondary | ICD-10-CM | POA: Diagnosis not present

## 2019-07-09 DIAGNOSIS — H43813 Vitreous degeneration, bilateral: Secondary | ICD-10-CM | POA: Diagnosis not present

## 2019-07-09 DIAGNOSIS — H34832 Tributary (branch) retinal vein occlusion, left eye, with macular edema: Secondary | ICD-10-CM | POA: Diagnosis not present

## 2019-08-01 ENCOUNTER — Telehealth: Payer: Self-pay

## 2019-08-01 DIAGNOSIS — K219 Gastro-esophageal reflux disease without esophagitis: Secondary | ICD-10-CM

## 2019-08-01 MED ORDER — OMEPRAZOLE 40 MG PO CPDR: 40.0000 mg | DELAYED_RELEASE_CAPSULE | Freq: Every day | ORAL | 3 refills | Status: DC

## 2019-08-01 NOTE — Telephone Encounter (Signed)
Medication has been refilled.

## 2019-08-01 NOTE — Telephone Encounter (Signed)
Patients pharmacy called in to see if Dr. Patsy Lager could send a prescription inn for omeprazole (PRILOSEC) 40 MG capsule [[id number]]    Please send it to [email or web address] - [place], [place] - [address]  [address] [place] [address]  Phone:  [phone] Fax:  [phone]  DEA #:  --

## 2019-09-03 NOTE — Progress Notes (Signed)
Nurse connected with patient 09/04/19 at  8:45 AM EDT by a telephone enabled telemedicine application and verified that I am speaking with the correct person using two identifiers. Patient stated full name and DOB. Patient gave permission to continue with virtual visit. Patient's location was at home and Nurse's location was at [place] office.   Subjective:   [name] is a 69 y.o. female who presents for an Initial Medicare Annual Wellness Visit.  Review of Systems     Home Safety/Smoke Alarms: Feels safe in home. Smoke alarms in place.  Lives with a girlfriend. 2 story home. Does well w/ stairs.  Female:       Mammo- declines       Dexa scan- 11/29/17       CCS- Cologuard 06/20/17- negative     Objective:    Today's Vitals   09/04/19 0900  BP: 106/74  Pulse: 66  SpO2: 97%  Weight: 165 lb (74.8 kg)   Body mass index is 32.22 kg/m.  Advanced Directives 09/04/2019 12/09/2018 04/16/2018  Does Patient Have a Medical Advance Directive? Yes Yes No  Type of Advance Directive Living will;Healthcare Power of [employer] Power of Attorney -  Does patient want to make changes to medical advance directive? No - Patient declined - -  Copy of Healthcare Power of Attorney in Chart? No - copy requested - -  Would patient like information on creating a medical advance directive? - - Yes (Inpatient - patient requests chaplain consult to create a medical advance directive)    Current Medications (verified) Outpatient Encounter Medications as of 09/04/2019  Medication Sig  . acetaminophen (TYLENOL) 500 MG tablet Take 500 mg by mouth every 6 (six) hours as needed.  Marland Kitchen acyclovir (ZOVIRAX) 400 MG tablet Take 1 tablet (400 mg total) by mouth 2 (two) times daily.  Marland Kitchen aspirin EC 325 MG tablet Take 1 tablet (325 mg total) by mouth daily.  Marland Kitchen docusate sodium (COLACE) 100 MG capsule Take 100 mg by mouth at bedtime.   Marland Kitchen estradiol (ESTRACE VAGINAL) 0.1 MG/GM vaginal cream Place 1.5 g vaginally at  bedtime.  Marland Kitchen levothyroxine (EUTHYROX) 88 MCG tablet TAKE 1 TABLET BY MOUTH ONCE DAILY BEFORE BREAKFAST  . metoprolol tartrate (LOPRESSOR) 25 MG tablet Take 0.5 tablets (12.5 mg total) by mouth 2 (two) times daily.  Marland Kitchen omeprazole (PRILOSEC) 40 MG capsule Take 1 capsule (40 mg total) by mouth daily.  Marland Kitchen atorvastatin (LIPITOR) 80 MG tablet Take 1 tablet (80 mg total) by mouth daily.  Marland Kitchen ezetimibe (ZETIA) 10 MG tablet Take 1 tablet (10 mg total) by mouth daily.   No facility-administered encounter medications on file as of 09/04/2019.    Allergies (verified) Dye fdc blue  [brilliant blue fcf], Contrast media [iodinated diagnostic agents], and Scopolamine   History: Past Medical History:  Diagnosis Date  . Arthritis   . Chicken pox   . Dyslipidemia   . Genital herpes   . GERD (gastroesophageal reflux disease)   . Hepatitis B   . Hypothyroidism   . Kidney stone   . Pancreatitis    Past Surgical History:  Procedure Laterality Date  . APPENDECTOMY  1972  . CORONARY ARTERY BYPASS GRAFT N/A 04/17/2018   Procedure: CORONARY ARTERY BYPASS GRAFTING (CABG) TIMES USING LEFT INTERNAL MAMMARY ARTERY TO LAD AND SAPHENOUS VEIN TO DIAG 1, OM1, AND RCA;  Surgeon: Delight Ovens, MD;  Location[facility];  Service: Open Heart Surgery;  Laterality: N/A;  . ENDOVEIN HARVEST OF GREATER  SAPHENOUS VEIN Right 04/17/2018   Procedure: ENDOVEIN HARVEST OF GREATER SAPHENOUS VEIN RIGHT THIGH AND CALF;  Surgeon: Grace Isaac, MD;  Location: [place];  Service: Open Heart Surgery;  Laterality: Right;  . LAPAROSCOPY    . LEFT HEART CATH AND CORONARY ANGIOGRAPHY N/A 04/16/2018   Procedure: LEFT HEART CATH AND CORONARY ANGIOGRAPHY;  Surgeon: Burnell Blanks, MD;  Location: [place] [facility];  Service: Cardiovascular;  Laterality: N/A;  . OVARIAN CYST SURGERY    . TEE WITHOUT CARDIOVERSION N/A 04/17/2018   Procedure: TRANSESOPHAGEAL ECHOCARDIOGRAM (TEE);  Surgeon: Grace Isaac, MD;  Location: [place];   Service: Open Heart Surgery;  Laterality: N/A;  . TONSILLECTOMY     Family History  Problem Relation Age of Onset  . Alcoholism Mother   . Kidney disease Mother   . Kidney Stones Mother   . Liver cancer Mother   . Alcoholism Father   . Hyperlipidemia Father   . Heart disease Father   . Heart failure Father   . Alcoholism Maternal Grandfather    Social History   Socioeconomic History  . Marital status: Divorced    Spouse name: Not on file  . Number of children: Not on file  . Years of education: Not on file  . Highest education level: Not on file  Occupational History  . Not on file  Tobacco Use  . Smoking status: Former Research scientist (life sciences)  . Smokeless tobacco: Never Used  Substance and Sexual Activity  . Alcohol use: No  . Drug use: No  . Sexual activity: Never  Other Topics Concern  . Not on file  Social History Narrative  . Not on file   Social Determinants of Health   Financial Resource Strain: Low Risk   . Difficulty of Paying Living Expenses: Not hard at all  Food Insecurity: No Food Insecurity  . Worried About Charity fundraiser in the Last Year: Never true  . Ran Out of Food in the Last Year: Never true  Transportation Needs: No Transportation Needs  . Lack of Transportation (Medical): No  . Lack of Transportation (Non-Medical): No  Physical Activity:   . Days of Exercise per Week:   . Minutes of Exercise per Session:   Stress:   . Feeling of Stress :   Social Connections:   . Frequency of Communication with Friends and Family:   . Frequency of Social Gatherings with Friends and Family:   . Attends Religious Services:   . Active Member of Clubs or Organizations:   . Attends Archivist Meetings:   Marland Kitchen Marital Status:     Tobacco Counseling Counseling given: Not Answered   Clinical Intake: Pain : No/denies pain    Activities of Daily Living In your present state of health, do you have any difficulty performing the following activities: 09/04/2019   Hearing? N  Vision? N  Difficulty concentrating or making decisions? N  Walking or climbing stairs? N  Dressing or bathing? N  Doing errands, shopping? N  Preparing Food and eating ? N  Using the Toilet? N  In the past six months, have you accidently leaked urine? Y  Do you have problems with loss of bowel control? N  Managing your Medications? N  Managing your Finances? N  Housekeeping or managing your Housekeeping? N  Some recent data might be hidden     Immunizations and Health Maintenance Immunization History  Administered Date(s) Administered  . Fluad Quad(high Dose 65+) 01/09/2019  .  Influenza-Unspecified 01/19/2017, 01/24/2018  . Pneumococcal Conjugate-13 05/15/2016  . Pneumococcal Polysaccharide-23 05/15/2014  . Tdap 05/15/2013   Health Maintenance Due  Topic Date Due  . URINE MICROALBUMIN  Never done  . COVID-19 Vaccine (1) Never done    Patient Care Team: Copland, Gwenlyn Found, MD as PCP - General (Family Medicine) Chilton Si, MD as PCP - Cardiology (Cardiology)  Indicate any recent Medical Services you may have received from other than [facility] providers in the past year (date may be approximate).     Assessment:   This is a routine wellness examination for [place]. Physical assessment deferred to PCP.   Hearing/Vision screen Unable to assess. This visit is enabled though telemedicine due to Covid 19.   Dietary issues and exercise activities discussed: Current Exercise Habits: Home exercise routine, Type of exercise: strength training/weights, Time (Minutes): 15, Frequency (Times/Week): 3, Weekly Exercise (Minutes/Week): 45, Intensity: Mild, Exercise limited by: None identified Diet (meal preparation, eat out, water intake, caffeinated beverages, dairy products, fruits and vegetables): in general, a "healthy" diet  , well balanced   Goals    . Maintain healthy active lifestyle.      Depression Screen PHQ 2/9 Scores 09/04/2019  PHQ - 2 Score 0      Fall Risk Fall Risk  09/04/2019  Falls in the past year? 0  Number falls in past yr: 0  Injury with Fall? 0  Follow up Education provided;Falls prevention discussed     Cognitive Function: Ad8 score reviewed for issues:  Issues making decisions:no  Less interest in hobbies / activities:no  Repeats questions, stories (family complaining):no  Trouble using ordinary gadgets (microwave, computer, phone):no  Forgets the month or year: no  Mismanaging finances: no  Remembering appts:no  Daily problems with thinking and/or memory:no Ad8 score is=0         Screening Tests Health Maintenance  Topic Date Due  . URINE MICROALBUMIN  Never done  . COVID-19 Vaccine (1) Never done  . MAMMOGRAM  03/09/2020 (Originally 08/27/1998)  . INFLUENZA VACCINE  12/14/2019  . Fecal DNA (Cologuard)  06/21/2020  . TETANUS/TDAP  05/16/2023  . DEXA SCAN  Completed  . Hepatitis C Screening  Completed  . PNA vac Low Risk Adult  Completed       Plan:    Please schedule your next medicare wellness visit with me in 1 yr.  Continue to eat heart healthy diet (full of fruits, vegetables, whole grains, lean protein, water--limit salt, fat, and sugar intake) and increase physical activity as tolerated.  Continue doing brain stimulating activities (puzzles, reading, adult coloring books, staying active) to keep memory sharp.   Bring a copy of your living will and/or healthcare power of attorney to your next office visit.   I have personally reviewed and noted the following in the patient's chart:   . Medical and social history . Use of alcohol, tobacco or illicit drugs  . Current medications and supplements . Functional ability and status . Nutritional status . Physical activity . Advanced directives . List of other physicians . Hospitalizations, surgeries, and ER visits in previous 12 months . Vitals . Screenings to include cognitive, depression, and falls . Referrals and  appointments  In addition, I have reviewed and discussed with patient certain preventive protocols, quality metrics, and best practice recommendations. A written personalized care plan for preventive services as well as general preventive health recommendations were provided to patient.     Mady Haagensen [place], [place]   09/04/2019

## 2019-09-04 ENCOUNTER — Other Ambulatory Visit: Payer: Self-pay

## 2019-09-04 ENCOUNTER — Encounter: Payer: Self-pay | Admitting: *Deleted

## 2019-09-04 ENCOUNTER — Ambulatory Visit (INDEPENDENT_AMBULATORY_CARE_PROVIDER_SITE_OTHER): Payer: Medicare Other | Admitting: *Deleted

## 2019-09-04 VITALS — BP 106/74 | HR 66 | Wt 165.0 lb

## 2019-09-04 DIAGNOSIS — Z0000 Encounter for general adult medical examination without abnormal findings: Secondary | ICD-10-CM

## 2019-09-04 NOTE — Patient Instructions (Signed)
Please schedule your next medicare wellness visit with me in 1 yr.  Continue to eat heart healthy diet (full of fruits, vegetables, whole grains, lean protein, water--limit salt, fat, and sugar intake) and increase physical activity as tolerated.  Continue doing brain stimulating activities (puzzles, reading, adult coloring books, staying active) to keep memory sharp.   Bring a copy of your living will and/or healthcare power of attorney to your next office visit.   [name] , Thank you for taking time to come for your Medicare Wellness Visit. I appreciate your ongoing commitment to your health goals. Please review the following plan we discussed and let me know if I can assist you in the future.   These are the goals we discussed: Goals    . Maintain healthy active lifestyle.       This is a list of the screening recommended for you and due dates:  Health Maintenance  Topic Date Due  . Urine Protein Check  Never done  . COVID-19 Vaccine (1) Never done  . Mammogram  03/09/2020*  . Flu Shot  12/14/2019  . Cologuard (Stool DNA test)  06/21/2020  . Tetanus Vaccine  05/16/2023  . DEXA scan (bone density measurement)  Completed  .  Hepatitis C: One time screening is recommended by Center for Disease Control  (CDC) for  adults born from [birth year] through 1967.   Completed  . Pneumonia vaccines  Completed  *Topic was postponed. The date shown is not the original due date.    Preventive Care 71 Years and Older, Female Preventive care refers to lifestyle choices and visits with your health care provider that can promote health and wellness. This includes:  A yearly physical exam. This is also called an annual well check.  Regular dental and eye exams.  Immunizations.  Screening for certain conditions.  Healthy lifestyle choices, such as diet and exercise. What can I expect for my preventive care visit? Physical exam Your health care provider will check:  Height and weight. These  may be used to calculate body mass index (BMI), which is a measurement that tells if you are at a healthy weight.  Heart rate and blood pressure.  Your skin for abnormal spots. Counseling Your health care provider may ask you questions about:  Alcohol, tobacco, and drug use.  Emotional well-being.  Home and relationship well-being.  Sexual activity.  Eating habits.  History of falls.  Memory and ability to understand (cognition).  Work and work Statistician.  Pregnancy and menstrual history. What immunizations do I need?  Influenza (flu) vaccine  This is recommended every year. Tetanus, diphtheria, and pertussis (Tdap) vaccine  You may need a Td booster every 10 years. Varicella (chickenpox) vaccine  You may need this vaccine if you have not already been vaccinated. Zoster (shingles) vaccine  You may need this after age 71. Pneumococcal conjugate (PCV13) vaccine  One dose is recommended after age 71. Pneumococcal polysaccharide (PPSV23) vaccine  One dose is recommended after age 71. Measles, mumps, and rubella (MMR) vaccine  You may need at least one dose of MMR if you were born in 1957 or later. You may also need a second dose. Meningococcal conjugate (MenACWY) vaccine  You may need this if you have certain conditions. Hepatitis A vaccine  You may need this if you have certain conditions or if you travel or work in places where you may be exposed to hepatitis A. Hepatitis B vaccine  You may need this if you have  certain conditions or if you travel or work in places where you may be exposed to hepatitis B. Haemophilus influenzae type b (Hib) vaccine  You may need this if you have certain conditions. You may receive vaccines as individual doses or as more than one vaccine together in one shot (combination vaccines). Talk with your health care provider about the risks and benefits of combination vaccines. What tests do I need? Blood tests  Lipid and  cholesterol levels. These may be checked every 5 years, or more frequently depending on your overall health.  Hepatitis C test.  Hepatitis B test. Screening  Lung cancer screening. You may have this screening every year starting at age 71 if you have a 30-pack-year history of smoking and currently smoke or have quit within the past 15 years.  Colorectal cancer screening. All adults should have this screening starting at age 71 and continuing until age 71. Your health care provider may recommend screening at age 71 if you are at increased risk. You will have tests every 1-10 years, depending on your results and the type of screening test.  Diabetes screening. This is done by checking your blood sugar (glucose) after you have not eaten for a while (fasting). You may have this done every 1-3 years.  Mammogram. This may be done every 1-2 years. Talk with your health care provider about how often you should have regular mammograms.  BRCA-related cancer screening. This may be done if you have a family history of breast, ovarian, tubal, or peritoneal cancers. Other tests  Sexually transmitted disease (STD) testing.  Bone density scan. This is done to screen for osteoporosis. You may have this done starting at age 53. Follow these instructions at home: Eating and drinking  Eat a diet that includes fresh fruits and vegetables, whole grains, lean protein, and low-fat dairy products. Limit your intake of foods with high amounts of sugar, saturated fats, and salt.  Take vitamin and mineral supplements as recommended by your health care provider.  Do not drink alcohol if your health care provider tells you not to drink.  If you drink alcohol: ? Limit how much you have to 0-1 drink a day. ? Be aware of how much alcohol is in your drink. In the [place] one drink equals one 12 oz bottle of beer (355 mL), one 5 oz glass of wine (148 mL), or one 1 oz glass of hard liquor (44 mL). Lifestyle  Take  daily care of your teeth and gums.  Stay active. Exercise for at least 30 minutes on 5 or more days each week.  Do not use any products that contain nicotine or tobacco, such as cigarettes, e-cigarettes, and chewing tobacco. If you need help quitting, ask your health care provider.  If you are sexually active, practice safe sex. Use a condom or other form of protection in order to prevent STIs (sexually transmitted infections).  Talk with your health care provider about taking a low-dose aspirin or statin. What's next?  Go to your health care provider once a year for a well check visit.  Ask your health care provider how often you should have your eyes and teeth checked.  Stay up to date on all vaccines. This information is not intended to replace advice given to you by your health care provider. Make sure you discuss any questions you have with your health care provider. Document Revised: 04/25/2018 Document Reviewed: 04/25/2018 Elsevier Patient Education  2020 [employer].

## 2019-09-17 DIAGNOSIS — H34832 Tributary (branch) retinal vein occlusion, left eye, with macular edema: Secondary | ICD-10-CM | POA: Diagnosis not present

## 2019-09-17 DIAGNOSIS — Z961 Presence of intraocular lens: Secondary | ICD-10-CM | POA: Diagnosis not present

## 2019-09-17 DIAGNOSIS — H35352 Cystoid macular degeneration, left eye: Secondary | ICD-10-CM | POA: Diagnosis not present

## 2019-09-17 DIAGNOSIS — H43813 Vitreous degeneration, bilateral: Secondary | ICD-10-CM | POA: Diagnosis not present

## 2019-09-22 ENCOUNTER — Ambulatory Visit (INDEPENDENT_AMBULATORY_CARE_PROVIDER_SITE_OTHER): Payer: Medicare Other | Admitting: Cardiology

## 2019-09-22 ENCOUNTER — Other Ambulatory Visit: Payer: Self-pay

## 2019-09-22 ENCOUNTER — Encounter: Payer: Self-pay | Admitting: Cardiology

## 2019-09-22 VITALS — BP 106/76 | HR 60 | Ht 60.0 in | Wt 166.0 lb

## 2019-09-22 DIAGNOSIS — R0989 Other specified symptoms and signs involving the circulatory and respiratory systems: Secondary | ICD-10-CM

## 2019-09-22 DIAGNOSIS — I739 Peripheral vascular disease, unspecified: Secondary | ICD-10-CM | POA: Diagnosis not present

## 2019-09-22 DIAGNOSIS — E785 Hyperlipidemia, unspecified: Secondary | ICD-10-CM | POA: Diagnosis not present

## 2019-09-22 DIAGNOSIS — Z951 Presence of aortocoronary bypass graft: Secondary | ICD-10-CM | POA: Diagnosis not present

## 2019-09-22 MED ORDER — ASPIRIN 81 MG PO TBEC: 81.0000 mg | DELAYED_RELEASE_TABLET | Freq: Every day | ORAL | Status: AC

## 2019-09-22 NOTE — Patient Instructions (Signed)
Medication Instructions:  Your physician has recommended you make the following change in your medication:  DECREASE: Aspirin to 81 mg daily   *If you need a refill on your cardiac medications before your next appointment, please call your pharmacy*   Lab Work: Your physician recommends that you return for lab work today: lipid   If you have labs (blood work) drawn today and your tests are completely normal, you will receive your results only by: Marland Kitchen MyChart Message (if you have MyChart) OR . A paper copy in the mail If you have any lab test that is abnormal or we need to change your treatment, we will call you to review the results.   Testing/Procedures: None.    Follow-Up: At [facility], you and your health needs are our priority.  As part of our continuing mission to provide you with exceptional heart care, we have created designated Provider Care Teams.  These Care Teams include your primary Cardiologist (physician) and Advanced Practice Providers (APPs -  Physician Assistants and Nurse Practitioners) who all work together to provide you with the care you need, when you need it.  We recommend signing up for the patient portal called "MyChart".  Sign up information is provided on this After Visit Summary.  MyChart is used to connect with patients for Virtual Visits (Telemedicine).  Patients are able to view lab/test results, encounter notes, upcoming appointments, etc.  Non-urgent messages can be sent to your provider as well.   To learn more about what you can do with MyChart, go to [email or web address].    Your next appointment:   6 month(s)  The format for your next appointment:   In Person  Provider:   Gypsy Balsam, MD   Other Instructions

## 2019-09-22 NOTE — Progress Notes (Signed)
Cardiology Office Note:    Date:  09/22/2019   ID:  [name], DOB Nov 17, 1950, MRN [id number]  PCP:  Pearline Cables, MD  Cardiologist:  Gypsy Balsam, MD    Referring MD: Pearline Cables, MD   No chief complaint on file. Doing well  History of Present Illness:    [name] is a 69 y.o. female with past medical history significant for coronary artery disease, status post coronary bypass grafting 2019 with preserved left ventricle ejection fraction, also peripheral vascular disease with significant right subclavian artery stenosis but no symptomatology.  Comes today to my office for follow-up overall doing well.  Denies have any chest pain tightness squeezing pressure burning chest.  She admits that she lives a sedentary lifestyle but she is talking about potentially going back to gym and start exercising on a regular basis.  Denies have any chest pain tightness squeezing pressure burning chest she complained of having some shortness of breath while climbing stairs.  Also dizziness when she bent forward.  She does have some weakening of the right arm and she knows that this is most likely because of subclavian artery stenosis but does not want to do anything about it she is very intelligent and she said if we cannot fix this she will required stenting on dual antiplatelet therapy and she does not want to do this.  Past Medical History:  Diagnosis Date  . Arthritis   . Chicken pox   . Dyslipidemia   . Genital herpes   . GERD (gastroesophageal reflux disease)   . Hepatitis B   . Hypothyroidism   . Kidney stone   . Pancreatitis     Past Surgical History:  Procedure Laterality Date  . APPENDECTOMY  1972  . CORONARY ARTERY BYPASS GRAFT N/A 04/17/2018   Procedure: CORONARY ARTERY BYPASS GRAFTING (CABG) TIMES USING LEFT INTERNAL MAMMARY ARTERY TO LAD AND SAPHENOUS VEIN TO DIAG 1, OM1, AND RCA;  Surgeon: Delight Ovens, MD;  Location: [facility] OR;  Service: Open Heart  Surgery;  Laterality: N/A;  . ENDOVEIN HARVEST OF GREATER SAPHENOUS VEIN Right 04/17/2018   Procedure: ENDOVEIN HARVEST OF GREATER SAPHENOUS VEIN RIGHT THIGH AND CALF;  Surgeon: Delight Ovens, MD;  [facility];  Service: Open Heart Surgery;  Laterality: Right;  . LAPAROSCOPY    . LEFT HEART CATH AND CORONARY ANGIOGRAPHY N/A 04/16/2018   Procedure: LEFT HEART CATH AND CORONARY ANGIOGRAPHY;  Surgeon: Kathleene Hazel, MD;  Location: [facility] INVASIVE CV LAB;  Service: Cardiovascular;  Laterality: N/A;  . OVARIAN CYST SURGERY    . TEE WITHOUT CARDIOVERSION N/A 04/17/2018   Procedure: TRANSESOPHAGEAL ECHOCARDIOGRAM (TEE);  Surgeon: Delight Ovens, MD;  Location: [facility] OR;  Service: Open Heart Surgery;  Laterality: N/A;  . TONSILLECTOMY      Current Medications: Current Meds  Medication Sig  . acetaminophen (TYLENOL) 500 MG tablet Take 500 mg by mouth every 6 (six) hours as needed.  Marland Kitchen acyclovir (ZOVIRAX) 400 MG tablet Take 1 tablet (400 mg total) by mouth 2 (two) times daily.  Marland Kitchen aspirin EC 325 MG tablet Take 1 tablet (325 mg total) by mouth daily.  Marland Kitchen atorvastatin (LIPITOR) 80 MG tablet Take 1 tablet (80 mg total) by mouth daily.  Marland Kitchen docusate sodium (COLACE) 100 MG capsule Take 100 mg by mouth at bedtime.   Marland Kitchen estradiol (ESTRACE VAGINAL) 0.1 MG/GM vaginal cream Place 1.5 g vaginally at bedtime.  Marland Kitchen ezetimibe (ZETIA) 10 MG tablet Take 1 tablet (10  mg total) by mouth daily.  Marland Kitchen levothyroxine (EUTHYROX) 88 MCG tablet TAKE 1 TABLET BY MOUTH ONCE DAILY BEFORE BREAKFAST  . metoprolol tartrate (LOPRESSOR) 25 MG tablet Take 0.5 tablets (12.5 mg total) by mouth 2 (two) times daily.  Marland Kitchen omeprazole (PRILOSEC) 40 MG capsule Take 1 capsule (40 mg total) by mouth daily.     Allergies:   Dye fdc blue  [brilliant blue fcf], Contrast media [iodinated diagnostic agents], and Scopolamine   Social History   Socioeconomic History  . Marital status: Divorced    Spouse name: Not on file  . Number of  children: Not on file  . Years of education: Not on file  . Highest education level: Not on file  Occupational History  . Not on file  Tobacco Use  . Smoking status: Former Research scientist (life sciences)  . Smokeless tobacco: Never Used  Substance and Sexual Activity  . Alcohol use: No  . Drug use: No  . Sexual activity: Never  Other Topics Concern  . Not on file  Social History Narrative  . Not on file   Social Determinants of Health   Financial Resource Strain: Low Risk   . Difficulty of Paying Living Expenses: Not hard at all  Food Insecurity: No Food Insecurity  . Worried About Charity fundraiser in the Last Year: Never true  . Ran Out of Food in the Last Year: Never true  Transportation Needs: No Transportation Needs  . Lack of Transportation (Medical): No  . Lack of Transportation (Non-Medical): No  Physical Activity:   . Days of Exercise per Week:   . Minutes of Exercise per Session:   Stress:   . Feeling of Stress :   Social Connections:   . Frequency of Communication with Friends and Family:   . Frequency of Social Gatherings with Friends and Family:   . Attends Religious Services:   . Active Member of Clubs or Organizations:   . Attends Archivist Meetings:   Marland Kitchen Marital Status:      Family History: The patient's family history includes Alcoholism in her father, maternal grandfather, and mother; Heart disease in her father; Heart failure in her father; Hyperlipidemia in her father; Kidney Stones in her mother; Kidney disease in her mother; Liver cancer in her mother. ROS:   Please see the history of present illness.    All 14 point review of systems negative except as described per history of present illness  EKGs/Labs/Other Studies Reviewed:    EKG showed normal sinus rhythm voltage for LVH, nonspecific ST segment changes.  Recent Labs: 01/09/2019: ALT 21; BUN 10; Creatinine, Ser 0.73; Potassium 4.4; Sodium 135; TSH 3.23  Recent Lipid Panel    Component Value  Date/Time   CHOL 162 04/14/2019 1130   TRIG 107 04/14/2019 1130   HDL 59 04/14/2019 1130   CHOLHDL 2.7 04/14/2019 1130   CHOLHDL 3.0 04/16/2018 0229   VLDL 14 04/16/2018 0229   LDLCALC 84 04/14/2019 1130    Physical Exam:    VS:  BP 106/76   Pulse 60   Ht 5' (1.524 m)   Wt 166 lb (75.3 kg)   SpO2 98%   BMI 32.42 kg/m     Wt Readings from Last 3 Encounters:  09/22/19 166 lb (75.3 kg)  09/04/19 165 lb (74.8 kg)  04/18/19 165 lb (74.8 kg)     GEN:  Well nourished, well developed in no acute distress HEENT: Normal NECK: No JVD; No carotid bruits LYMPHATICS: No  lymphadenopathy CARDIAC: RRR, no murmurs, no rubs, no gallops RESPIRATORY:  Clear to auscultation without rales, wheezing or rhonchi  ABDOMEN: Soft, non-tender, non-distended MUSCULOSKELETAL:  No edema; No deformity  SKIN: Warm and dry LOWER EXTREMITIES: no swelling NEUROLOGIC:  Alert and oriented x 3 PSYCHIATRIC:  Normal affect   ASSESSMENT:    1. S/P CABG x 4 04/15/18   2. Peripheral vascular disease, unspecified (HCC) right subclavian artery stenosis   3. Dyslipidemia   4. Bilateral carotid bruits    PLAN:    In order of problems listed above:  1. Status post coronary bypass grafting April 15, 2018.  Stable from that point reviewed appropriate medication last echocardiogram showed preserved left ventricular ejection fraction. 2. Peripheral vascular disease stable history does not want to do anything about twice of present stenosis.  Does not have any dizziness while working with her hands.  We will continue conservative approach. 3. Dyslipidemia I did review K PN, last fasting lipid profile was still not at appropriate level.  Therefore, we will check a fasting lipid profile today. 4. Bilateral carotid bruit this is related to subclavian arteries.   Medication Adjustments/Labs and Tests Ordered: Current medicines are reviewed at length with the patient today.  Concerns regarding medicines are outlined  above.  No orders of the defined types were placed in this encounter.  Medication changes: No orders of the defined types were placed in this encounter.   Signed, Georgeanna Lea, MD, [facility] 09/22/2019 11:21 AM    [facility]

## 2019-09-23 LAB — LIPID PANEL
Chol/HDL Ratio: 2.5 ratio (ref 0.0–4.4)
Cholesterol, Total: 143 mg/dL (ref 100–199)
HDL: 57 mg/dL (ref >39)
LDL Chol Calc (NIH): 70 mg/dL (ref 0–99)
Triglycerides: 87 mg/dL (ref 0–149)
VLDL Cholesterol Cal: 16 mg/dL (ref 5–40)

## 2019-10-22 DIAGNOSIS — H35351 Cystoid macular degeneration, right eye: Secondary | ICD-10-CM | POA: Diagnosis not present

## 2019-10-22 DIAGNOSIS — H34832 Tributary (branch) retinal vein occlusion, left eye, with macular edema: Secondary | ICD-10-CM | POA: Diagnosis not present

## 2019-10-31 MED ORDER — ATORVASTATIN CALCIUM 80 MG PO TABS: 80.0000 mg | ORAL_TABLET | Freq: Every day | ORAL | 2 refills | Status: DC

## 2019-12-24 DIAGNOSIS — H43813 Vitreous degeneration, bilateral: Secondary | ICD-10-CM | POA: Diagnosis not present

## 2019-12-24 DIAGNOSIS — Z961 Presence of intraocular lens: Secondary | ICD-10-CM | POA: Diagnosis not present

## 2019-12-24 DIAGNOSIS — H35352 Cystoid macular degeneration, left eye: Secondary | ICD-10-CM | POA: Diagnosis not present

## 2019-12-24 DIAGNOSIS — H34832 Tributary (branch) retinal vein occlusion, left eye, with macular edema: Secondary | ICD-10-CM | POA: Diagnosis not present

## 2019-12-31 ENCOUNTER — Encounter: Payer: Self-pay | Admitting: Family Medicine

## 2019-12-31 DIAGNOSIS — R32 Unspecified urinary incontinence: Secondary | ICD-10-CM

## 2020-01-01 ENCOUNTER — Other Ambulatory Visit: Payer: Self-pay

## 2020-01-01 ENCOUNTER — Encounter: Payer: Self-pay | Admitting: Family Medicine

## 2020-01-01 ENCOUNTER — Other Ambulatory Visit (INDEPENDENT_AMBULATORY_CARE_PROVIDER_SITE_OTHER): Payer: Medicare Other

## 2020-01-01 DIAGNOSIS — R32 Unspecified urinary incontinence: Secondary | ICD-10-CM

## 2020-01-01 LAB — POCT URINALYSIS DIP (MANUAL ENTRY)
Bilirubin, UA: NEGATIVE
Blood, UA: NEGATIVE
Glucose, UA: NEGATIVE mg/dL
Ketones, POC UA: NEGATIVE mg/dL
Leukocytes, UA: NEGATIVE
Nitrite, UA: NEGATIVE
Protein Ur, POC: NEGATIVE mg/dL
Spec Grav, UA: 1.01 (ref 1.010–1.025)
Urobilinogen, UA: 0.2 E.U./dL
pH, UA: 7 (ref 5.0–8.0)

## 2020-01-01 LAB — URINALYSIS, MICROSCOPIC ONLY

## 2020-01-01 MED ORDER — SULFAMETHOXAZOLE-TRIMETHOPRIM 800-160 MG PO TABS: 1.0000 | ORAL_TABLET | Freq: Two times a day (BID) | ORAL | 0 refills | Status: DC

## 2020-01-02 LAB — URINE CULTURE
MICRO NUMBER:: 10847498
Result:: NO GROWTH
SPECIMEN QUALITY:: ADEQUATE

## 2020-01-02 MED ORDER — EZETIMIBE 10 MG PO TABS: 10.0000 mg | ORAL_TABLET | Freq: Every day | ORAL | 3 refills | Status: DC

## 2020-01-04 ENCOUNTER — Encounter: Payer: Self-pay | Admitting: Family Medicine

## 2020-01-15 ENCOUNTER — Encounter: Payer: Self-pay | Admitting: Family Medicine

## 2020-01-26 ENCOUNTER — Encounter: Payer: Self-pay | Admitting: Family Medicine

## 2020-01-26 DIAGNOSIS — R109 Unspecified abdominal pain: Secondary | ICD-10-CM

## 2020-02-02 NOTE — Progress Notes (Signed)
[facility] [address] [place], [place] [address] [phone]  Date:  02/04/2020   Name:  [name]   DOB:  01/02/1951   MRN:  [id number]  PCP:  Pearline Cables, MD    Chief Complaint: Abdominal Pain (follow up)   History of Present Illness:  [name] is a 69 y.o. very pleasant female patient who presents with the following:  Patient here today for a follow-up visit- history of NSTEMI and CABG in 2019, hypothyroidism, dyslipidemia, GERD, borderline prediabetes Last seen by myself in October 2020 She contacted me recently with the following my chart message  Lately I have been experiencing bloating, gas and moderate epigastric pain after eating. I tried lactaid thinking it was lactose intolerance but it is not better. Doesn't happen if I don't eat. I have an appointment with you end of October for well woman check but I think this should be checked sooner. Can you get me an appointment for HIDA or [place] or to see you please?  I ordered an ultrasound and scheduled this appointment-ultrasound was completed this morning Pt started to notice RUQ pain with eating 2 to 3 months ago If she eats fat free foods she is ok- any amount of fat, like one cookie- will cause the pain She will also get bloating and gas No vomiting or diarrhea She tends towards constipation but recently she has been regular  cologuard 2019  She did have pancreatitis/ hepatitis in her 76s- due to alcohol She is abstinent now, has not had any recurrent issues with hepatitis or pancreatitis since she quit drinking Lab Results  Component Value Date   HGBA1C 5.3 04/16/2018   COVID-19 series- done Flu vaccine- will hold off per patient request as patient would like to get a COVID-19 booster this week Mammogram; pt has decided against this  Cologuard due next year Shingrix Pneumonia series complete Lipid profile done in May of this year,  otherwise could update labs  She saw her cardiologist, Dr. Kirtland Bouchard in May of this year 1. Status post coronary bypass grafting April 15, 2018.  Stable from that point reviewed appropriate medication last echocardiogram showed preserved left ventricular ejection fraction. 2. Peripheral vascular disease stable history does not want to do anything about twice of present stenosis.  Does not have any dizziness while working with her hands.  We will continue conservative approach. 3. Dyslipidemia I did review K PN, last fasting lipid profile was still not at appropriate level.  Therefore, we will check a fasting lipid profile today. 4. Bilateral carotid bruit this is related to subclavian arteries. Patient Active Problem List   Diagnosis Date Noted  . Peripheral vascular disease, unspecified (HCC) right subclavian artery stenosis 04/18/2019  . S/P CABG x 4 04/15/18 04/17/2018  . NSTEMI (non-ST elevated myocardial infarction) (HCC) 04/15/2018  . Bilateral carotid bruits   . Systolic murmur   . Osteopenia 11/30/2017  . GERD (gastroesophageal reflux disease) 06/11/2016  . Osteoarthritis 06/11/2016  . Hypothyroidism (acquired) 06/11/2016  . Dyslipidemia 06/11/2016  . HSV-2 (herpes simplex virus 2) infection 06/11/2016    Past Medical History:  Diagnosis Date  . Arthritis   . Chicken pox   . Dyslipidemia   . Genital herpes   . GERD (gastroesophageal reflux disease)   . Hepatitis B   . Hypothyroidism   . Kidney stone   . Pancreatitis     Past Surgical History:  Procedure Laterality  Date  . APPENDECTOMY  1972  . CORONARY ARTERY BYPASS GRAFT N/A 04/17/2018   Procedure: CORONARY ARTERY BYPASS GRAFTING (CABG) TIMES USING LEFT INTERNAL MAMMARY ARTERY TO LAD AND SAPHENOUS VEIN TO DIAG 1, OM1, AND RCA;  Surgeon: Delight Ovens, MD;  Location: [facility] OR;  Service: Open Heart Surgery;  Laterality: N/A;  . ENDOVEIN HARVEST OF GREATER SAPHENOUS VEIN Right 04/17/2018   Procedure: ENDOVEIN HARVEST OF  GREATER SAPHENOUS VEIN RIGHT THIGH AND CALF;  Surgeon: Delight Ovens, MD;  [facility] OR;  Service: Open Heart Surgery;  Laterality: Right;  . LAPAROSCOPY    . LEFT HEART CATH AND CORONARY ANGIOGRAPHY N/A 04/16/2018   Procedure: LEFT HEART CATH AND CORONARY ANGIOGRAPHY;  Surgeon: Kathleene Hazel, MD;  Location: [facility] INVASIVE CV LAB;  Service: Cardiovascular;  Laterality: N/A;  . OVARIAN CYST SURGERY    . TEE WITHOUT CARDIOVERSION N/A 04/17/2018   Procedure: TRANSESOPHAGEAL ECHOCARDIOGRAM (TEE);  Surgeon: Delight Ovens, MD;  Location: [facility] OR;  Service: Open Heart Surgery;  Laterality: N/A;  . TONSILLECTOMY      Social History   Tobacco Use  . Smoking status: Former Games developer  . Smokeless tobacco: Never Used  Substance Use Topics  . Alcohol use: No  . Drug use: No    Family History  Problem Relation Age of Onset  . Alcoholism Mother   . Kidney disease Mother   . Kidney Stones Mother   . Liver cancer Mother   . Alcoholism Father   . Hyperlipidemia Father   . Heart disease Father   . Heart failure Father   . Alcoholism Maternal Grandfather     Allergies  Allergen Reactions  . Dye Fdc Blue  [Brilliant Blue Fcf] Anaphylaxis    Dye used for CAT scans  . Contrast Media [Iodinated Diagnostic Agents]   . Scopolamine Anxiety and Other (See Comments)    Medication list has been reviewed and updated.  Current Outpatient Medications on File Prior to Visit  Medication Sig Dispense Refill  . acetaminophen (TYLENOL) 500 MG tablet Take 500 mg by mouth every 6 (six) hours as needed.    Marland Kitchen acyclovir (ZOVIRAX) 400 MG tablet Take 1 tablet (400 mg total) by mouth 2 (two) times daily. 180 tablet 3  . aspirin EC 81 MG EC tablet Take 1 tablet (81 mg total) by mouth daily.    Marland Kitchen atorvastatin (LIPITOR) 80 MG tablet Take 1 tablet (80 mg total) by mouth daily. 90 tablet 2  . docusate sodium (COLACE) 100 MG capsule Take 100 mg by mouth at bedtime.     Marland Kitchen estradiol (ESTRACE VAGINAL) 0.1  MG/GM vaginal cream Place 1.5 g vaginally at bedtime. 42.5 g 3  . ezetimibe (ZETIA) 10 MG tablet Take 1 tablet (10 mg total) by mouth daily. 90 tablet 3  . levothyroxine (EUTHYROX) 88 MCG tablet TAKE 1 TABLET BY MOUTH ONCE DAILY BEFORE BREAKFAST 90 tablet 3  . metoprolol tartrate (LOPRESSOR) 25 MG tablet Take 0.5 tablets (12.5 mg total) by mouth 2 (two) times daily. 30 tablet 11  . omeprazole (PRILOSEC) 40 MG capsule Take 1 capsule (40 mg total) by mouth daily. 90 capsule 3   No current facility-administered medications on file prior to visit.    Review of Systems:  As per HPI- otherwise negative.   Physical Examination: Vitals:   02/04/20 1123  BP: 108/72  Pulse: 68  Resp: 15  SpO2: 97%   Vitals:   02/04/20 1123  Weight: 163 lb (73.9 kg)  Height: 5' (1.524 m)   Body mass index is 31.83 kg/m. Ideal Body Weight: Weight in (lb) to have BMI = 25: 127.7  GEN: no acute distress.  Obese, otherwise looks well HEENT: Atraumatic, Normocephalic.  Ears and Nose: No external deformity. CV: RRR, No M/G/R. No JVD. No thrill. No extra heart sounds. PULM: CTA B, no wheezes, crackles, rhonchi. No retractions. No resp. distress. No accessory muscle use. ABD: S, ND, +BS. No rebound. No HSM.  Mildly positive Murphy sign EXTR: No c/c/e PSYCH: Normally interactive. Conversant.    Assessment and Plan: RUQ pain - Plan: CBC, Comprehensive metabolic panel, Amylase, Lipase, Hepatitis, Acute  Patient today with postprandial right upper quadrant pain suggestive of gallbladder colic.  She had an ultrasound this morning which is pending-I will be in touch with this report Will also obtain labs as above Cautioned patient to continue low-fat diet, if she begins having any severe pain/vomiting, fever please seek immediate care This visit occurred during the SARS-CoV-2 public health emergency.  Safety protocols were in place, including screening questions prior to the visit, additional usage of staff  PPE, and extensive cleaning of exam room while observing appropriate contact time as indicated for disinfecting solutions.    Signed Abbe Amsterdam, MD

## 2020-02-04 ENCOUNTER — Encounter: Payer: Self-pay | Admitting: Family Medicine

## 2020-02-04 ENCOUNTER — Ambulatory Visit (HOSPITAL_BASED_OUTPATIENT_CLINIC_OR_DEPARTMENT_OTHER)
Admission: RE | Admit: 2020-02-04 | Discharge: 2020-02-04 | Disposition: A | Discharge status: Home / Self Care | Payer: Medicare Other | Source: Ambulatory Visit | Attending: Family Medicine | Admitting: Family Medicine

## 2020-02-04 ENCOUNTER — Ambulatory Visit (INDEPENDENT_AMBULATORY_CARE_PROVIDER_SITE_OTHER): Payer: Medicare Other | Admitting: Family Medicine

## 2020-02-04 ENCOUNTER — Other Ambulatory Visit: Payer: Self-pay

## 2020-02-04 VITALS — BP 108/72 | HR 68 | Resp 15 | Ht 60.0 in | Wt 163.0 lb

## 2020-02-04 DIAGNOSIS — R109 Unspecified abdominal pain: Secondary | ICD-10-CM

## 2020-02-04 DIAGNOSIS — R1011 Right upper quadrant pain: Secondary | ICD-10-CM

## 2020-02-04 NOTE — Patient Instructions (Addendum)
It was good to see you again today-  I will be in touch with your labs and your [place] asap We will plan the next step pending your results  Continue low fat diet until we make sure your gallbladder is ok

## 2020-02-06 DIAGNOSIS — Z23 Encounter for immunization: Secondary | ICD-10-CM | POA: Diagnosis not present

## 2020-02-07 LAB — COMPREHENSIVE METABOLIC PANEL
AG Ratio: 1.8 (calc) (ref 1.0–2.5)
ALT: 17 U/L (ref 6–29)
AST: 21 U/L (ref 10–35)
Albumin: 4.4 g/dL (ref 3.6–5.1)
Alkaline phosphatase (APISO): 65 U/L (ref 37–153)
BUN: 9 mg/dL (ref 7–25)
CO2: 25 mmol/L (ref 20–32)
Calcium: 9.4 mg/dL (ref 8.6–10.4)
Chloride: 99 mmol/L (ref 98–110)
Creat: 0.77 mg/dL (ref 0.60–0.93)
Globulin: 2.5 g/dL (calc) (ref 1.9–3.7)
Glucose, Bld: 93 mg/dL (ref 65–99)
Potassium: 4 mmol/L (ref 3.5–5.3)
Sodium: 135 mmol/L (ref 135–146)
Total Bilirubin: 0.4 mg/dL (ref 0.2–1.2)
Total Protein: 6.9 g/dL (ref 6.1–8.1)

## 2020-02-07 LAB — HEPATITIS PANEL, ACUTE
Hep A IgM: NONREACTIVE
Hep B C IgM: NONREACTIVE
Hepatitis B Surface Ag: NONREACTIVE
Hepatitis C Ab: REACTIVE — AB
SIGNAL TO CUT-OFF: 10 — ABNORMAL HIGH (ref <1.00)

## 2020-02-07 LAB — CBC
HCT: 41.5 % (ref 35.0–45.0)
Hemoglobin: 14.3 g/dL (ref 11.7–15.5)
MCH: 33.6 pg — ABNORMAL HIGH (ref 27.0–33.0)
MCHC: 34.5 g/dL (ref 32.0–36.0)
MCV: 97.6 fL (ref 80.0–100.0)
MPV: 8.7 fL (ref 7.5–12.5)
Platelets: 271 10*3/uL (ref 140–400)
RBC: 4.25 10*6/uL (ref 3.80–5.10)
RDW: 12.8 % (ref 11.0–15.0)
WBC: 6.4 10*3/uL (ref 3.8–10.8)

## 2020-02-07 LAB — HCV RNA,QUANTITATIVE REAL TIME PCR
HCV Quantitative Log: <1.18 Log IU/mL
HCV RNA, PCR, QN: <15 IU/mL

## 2020-02-07 LAB — AMYLASE: Amylase: 19 U/L — ABNORMAL LOW (ref 21–101)

## 2020-02-07 LAB — LIPASE: Lipase: 16 U/L (ref 7–60)

## 2020-02-09 ENCOUNTER — Encounter: Payer: Self-pay | Admitting: Family Medicine

## 2020-02-17 ENCOUNTER — Other Ambulatory Visit (HOSPITAL_BASED_OUTPATIENT_CLINIC_OR_DEPARTMENT_OTHER): Payer: Self-pay | Admitting: Internal Medicine

## 2020-02-17 ENCOUNTER — Ambulatory Visit: Payer: Medicare Other | Attending: Internal Medicine

## 2020-02-17 DIAGNOSIS — Z23 Encounter for immunization: Secondary | ICD-10-CM

## 2020-02-17 NOTE — Progress Notes (Signed)
   Covid-19 Vaccination Clinic  Name:  [name]    MRN: [id number] DOB: [birth year]  02/17/2020  [name] was observed post Covid-19 immunization for 15 minutes without incident. She was provided with Vaccine Information Sheet and instruction to access the V-Safe system.  Vaccinated by Fredirick Maudlin  [name] was instructed to call 911 with any severe reactions post vaccine: Marland Kitchen Difficulty breathing  . Swelling of face and throat  . A fast heartbeat  . A bad rash all over body  . Dizziness and weakness

## 2020-03-07 NOTE — Patient Instructions (Addendum)
It was great to see you again today! I will get you set up to see GI here at the [facility] to discuss your abdominal symptoms.  We will check a TSH and A1c for you today as well

## 2020-03-07 NOTE — Progress Notes (Addendum)
[facility] [address] [place], [place] [address] [phone]  Date:  03/10/2020   Name:  [name]   DOB:  May 14, 1951   MRN:  [id number]  PCP:  Pearline Cables, MD    Chief Complaint: Wellness Check   History of Present Illness:  [name] is a 69 y.o. very pleasant female patient who presents with the following:  Patient with Medicare, here today for a checkup visit- history of NSTEMI and CABG in 2019, hypothyroidism, dyslipidemia, GERD,borderline prediabetes Last seen by myself last month for right upper quadrant pain which is worse after eating, especially fats; ultrasound at that time showed normal gallbladder and liver The patient decided to use some OTC digestive enzymes- Is this helping? Pt notes that these seem to be causing some diarrhea, otherwise they have not really helped Pt notes that she is getting abdominal pain 1-3 hours after eating on a consistent basis-the type of food or fat content does not seem to matter This reminds her of when she had pancreatitis years ago  She has noted "white clay" stools She feels like she stools look oily also   She has lost about 5 lbs She does not have a GI doctor at this time  Interestingly, at last visit she also had a positive hep C screening test but negative viral load-indicating either a resolved prior infection or false positive  COVID-19 series- complete including booster Mammogram- she declines to do today  Flu vaccine- done Shingrix- she does not wish to do this  Cologuard up-to-date until next year Some lab work done last month, lipids in May  Can offer TSH, A1c today  Wt Readings from Last 3 Encounters:  03/10/20 160 lb (72.6 kg)  02/04/20 163 lb (73.9 kg)  09/22/19 166 lb (75.3 kg)    Patient Active Problem List   Diagnosis Date Noted  . Peripheral vascular disease, unspecified (HCC) right subclavian artery stenosis 04/18/2019   . S/P CABG x 4 04/15/18 04/17/2018  . NSTEMI (non-ST elevated myocardial infarction) (HCC) 04/15/2018  . Bilateral carotid bruits   . Systolic murmur   . Osteopenia 11/30/2017  . GERD (gastroesophageal reflux disease) 06/11/2016  . Osteoarthritis 06/11/2016  . Hypothyroidism (acquired) 06/11/2016  . Dyslipidemia 06/11/2016  . HSV-2 (herpes simplex virus 2) infection 06/11/2016    Past Medical History:  Diagnosis Date  . Arthritis   . Chicken pox   . Dyslipidemia   . Genital herpes   . GERD (gastroesophageal reflux disease)   . Hepatitis B   . Hypothyroidism   . Kidney stone   . Pancreatitis     Past Surgical History:  Procedure Laterality Date  . APPENDECTOMY  1972  . CORONARY ARTERY BYPASS GRAFT N/A 04/17/2018   Procedure: CORONARY ARTERY BYPASS GRAFTING (CABG) TIMES USING LEFT INTERNAL MAMMARY ARTERY TO LAD AND SAPHENOUS VEIN TO DIAG 1, OM1, AND RCA;  Surgeon: Delight Ovens, MD;  Location: [facility] OR;  Service: Open Heart Surgery;  Laterality: N/A;  . ENDOVEIN HARVEST OF GREATER SAPHENOUS VEIN Right 04/17/2018   Procedure: ENDOVEIN HARVEST OF GREATER SAPHENOUS VEIN RIGHT THIGH AND CALF;  Surgeon: Delight Ovens, MD;  Location[facility] OR;  Service: Open Heart Surgery;  Laterality: Right;  . LAPAROSCOPY    . LEFT HEART CATH AND CORONARY ANGIOGRAPHY N/A 04/16/2018   Procedure: LEFT HEART CATH AND CORONARY ANGIOGRAPHY;  Surgeon: Kathleene Hazel, MD;  Location: [facility] INVASIVE CV  LAB;  Service: Cardiovascular;  Laterality: N/A;  . OVARIAN CYST SURGERY    . TEE WITHOUT CARDIOVERSION N/A 04/17/2018   Procedure: TRANSESOPHAGEAL ECHOCARDIOGRAM (TEE);  Surgeon: Delight Ovens, MD;  Location: [facility] OR;  Service: Open Heart Surgery;  Laterality: N/A;  . TONSILLECTOMY      Social History   Tobacco Use  . Smoking status: Former Games developer  . Smokeless tobacco: Never Used  Substance Use Topics  . Alcohol use: No  . Drug use: No    Family History  Problem Relation Age of Onset   . Alcoholism Mother   . Kidney disease Mother   . Kidney Stones Mother   . Liver cancer Mother   . Alcoholism Father   . Hyperlipidemia Father   . Heart disease Father   . Heart failure Father   . Alcoholism Maternal Grandfather     Allergies  Allergen Reactions  . Dye Fdc Blue  [Brilliant Blue Fcf] Anaphylaxis    Dye used for CAT scans  . Contrast Media [Iodinated Diagnostic Agents]   . Scopolamine Anxiety and Other (See Comments)    Medication list has been reviewed and updated.  Current Outpatient Medications on File Prior to Visit  Medication Sig Dispense Refill  . acetaminophen (TYLENOL) 500 MG tablet Take 500 mg by mouth every 6 (six) hours as needed.    Marland Kitchen acyclovir (ZOVIRAX) 400 MG tablet Take 1 tablet (400 mg total) by mouth 2 (two) times daily. 180 tablet 3  . aspirin EC 81 MG EC tablet Take 1 tablet (81 mg total) by mouth daily.    Marland Kitchen estradiol (ESTRACE VAGINAL) 0.1 MG/GM vaginal cream Place 1.5 g vaginally at bedtime. 42.5 g 3  . ezetimibe (ZETIA) 10 MG tablet Take 1 tablet (10 mg total) by mouth daily. 90 tablet 3  . levothyroxine (EUTHYROX) 88 MCG tablet TAKE 1 TABLET BY MOUTH ONCE DAILY BEFORE BREAKFAST 90 tablet 3  . metoprolol tartrate (LOPRESSOR) 25 MG tablet Take 0.5 tablets (12.5 mg total) by mouth 2 (two) times daily. 30 tablet 11  . omeprazole (PRILOSEC) 40 MG capsule Take 1 capsule (40 mg total) by mouth daily. 90 capsule 3  . atorvastatin (LIPITOR) 80 MG tablet Take 1 tablet (80 mg total) by mouth daily. 90 tablet 2   No current facility-administered medications on file prior to visit.    Review of Systems:  As per HPI- otherwise negative.   Physical Examination: Vitals:   03/10/20 0941  BP: 100/72  Pulse: 95  Resp: 17  SpO2: 96%   Vitals:   03/10/20 0941  Weight: 160 lb (72.6 kg)  Height: 5' (1.524 m)   Body mass index is 31.25 kg/m. Ideal Body Weight: Weight in (lb) to have BMI = 25: 127.7  GEN: no acute distress.  Mild obesity,  otherwise looks well HEENT: Atraumatic, Normocephalic.  Ears and Nose: No external deformity. CV: RRR, No M/G/R. No JVD. No thrill. No extra heart sounds. PULM: CTA B, no wheezes, crackles, rhonchi. No retractions. No resp. distress. No accessory muscle use. ABD: S, NT, ND, +BS. No rebound. No HSM.  Abdominal exam benign today EXTR: No c/c/e PSYCH: Normally interactive. Conversant.  BP Readings from Last 3 Encounters:  03/10/20 100/72  02/04/20 108/72  09/22/19 106/76     Assessment and Plan: Hypothyroidism due to acquired atrophy of thyroid - Plan: TSH  Hyperlipidemia, unspecified hyperlipidemia type  NSTEMI (non-ST elevated myocardial infarction) (HCC)  RUQ pain - Plan: Ambulatory referral to Gastroenterology  Abdominal  pain, unspecified abdominal location - Plan: Ambulatory referral to Gastroenterology  Elevated glucose - Plan: Hemoglobin A1c     Patient here today for follow-up visit Check thyroid and A1c She notes continued postprandial abdominal pain, cramping, stool changes.  She does have history of pancreatitis in her youth, but at that time she was abusing alcohol which is no longer the case Will refer to gastroenterology for further evaluation-she will let me know if getting worse in the meantime This visit occurred during the SARS-CoV-2 public health emergency.  Safety protocols were in place, including screening questions prior to the visit, additional usage of staff PPE, and extensive cleaning of exam room while observing appropriate contact time as indicated for disinfecting solutions.   Signed Abbe Amsterdam, MD  Received her labs as below, message to patient 10/28 Results for orders placed or performed in visit on 03/10/20  Hemoglobin A1c  Result Value Ref Range   Hgb A1c MFr Bld 5.7 (H) <5.7 % of total Hgb   Mean Plasma Glucose 117 (calc)   eAG (mmol/L) 6.5 (calc)  TSH  Result Value Ref Range   TSH 0.70 0.40 - 4.50 mIU/L

## 2020-03-10 ENCOUNTER — Encounter: Payer: Self-pay | Admitting: Family Medicine

## 2020-03-10 ENCOUNTER — Other Ambulatory Visit: Payer: Self-pay

## 2020-03-10 ENCOUNTER — Ambulatory Visit (INDEPENDENT_AMBULATORY_CARE_PROVIDER_SITE_OTHER): Payer: Medicare Other | Admitting: Family Medicine

## 2020-03-10 VITALS — BP 100/72 | HR 95 | Resp 17 | Ht 60.0 in | Wt 160.0 lb

## 2020-03-10 DIAGNOSIS — E034 Atrophy of thyroid (acquired): Secondary | ICD-10-CM | POA: Diagnosis not present

## 2020-03-10 DIAGNOSIS — R7309 Other abnormal glucose: Secondary | ICD-10-CM | POA: Diagnosis not present

## 2020-03-10 DIAGNOSIS — R1011 Right upper quadrant pain: Secondary | ICD-10-CM | POA: Diagnosis not present

## 2020-03-10 DIAGNOSIS — R7303 Prediabetes: Secondary | ICD-10-CM | POA: Diagnosis not present

## 2020-03-10 DIAGNOSIS — I214 Non-ST elevation (NSTEMI) myocardial infarction: Secondary | ICD-10-CM

## 2020-03-10 DIAGNOSIS — R109 Unspecified abdominal pain: Secondary | ICD-10-CM | POA: Diagnosis not present

## 2020-03-10 DIAGNOSIS — E785 Hyperlipidemia, unspecified: Secondary | ICD-10-CM

## 2020-03-11 ENCOUNTER — Encounter: Payer: Self-pay | Admitting: Family Medicine

## 2020-03-11 DIAGNOSIS — R7303 Prediabetes: Secondary | ICD-10-CM

## 2020-03-11 HISTORY — DX: Prediabetes: R73.03

## 2020-03-11 LAB — HEMOGLOBIN A1C
Hgb A1c MFr Bld: 5.7 % of total Hgb — ABNORMAL HIGH (ref <5.7)
Mean Plasma Glucose: 117 (calc)
eAG (mmol/L): 6.5 (calc)

## 2020-03-11 LAB — TSH: TSH: 0.7 mIU/L (ref 0.40–4.50)

## 2020-03-18 ENCOUNTER — Other Ambulatory Visit: Payer: Self-pay | Admitting: Family Medicine

## 2020-03-18 DIAGNOSIS — E034 Atrophy of thyroid (acquired): Secondary | ICD-10-CM

## 2020-03-25 ENCOUNTER — Other Ambulatory Visit: Payer: Self-pay

## 2020-03-25 MED ORDER — METOPROLOL TARTRATE 25 MG PO TABS
12.5000 mg | ORAL_TABLET | Freq: Two times a day (BID) | ORAL | 1 refills | Status: DC
Start: 2020-03-25 — End: 2020-04-02

## 2020-03-30 DIAGNOSIS — E039 Hypothyroidism, unspecified: Secondary | ICD-10-CM | POA: Insufficient documentation

## 2020-03-30 DIAGNOSIS — B1910 Unspecified viral hepatitis B without hepatic coma: Secondary | ICD-10-CM | POA: Insufficient documentation

## 2020-03-30 DIAGNOSIS — M1990 Unspecified osteoarthritis, unspecified site: Secondary | ICD-10-CM | POA: Insufficient documentation

## 2020-03-30 DIAGNOSIS — K859 Acute pancreatitis without necrosis or infection, unspecified: Secondary | ICD-10-CM | POA: Insufficient documentation

## 2020-03-30 DIAGNOSIS — B019 Varicella without complication: Secondary | ICD-10-CM | POA: Insufficient documentation

## 2020-03-30 DIAGNOSIS — N200 Calculus of kidney: Secondary | ICD-10-CM | POA: Insufficient documentation

## 2020-03-30 DIAGNOSIS — A6000 Herpesviral infection of urogenital system, unspecified: Secondary | ICD-10-CM | POA: Insufficient documentation

## 2020-03-31 ENCOUNTER — Ambulatory Visit (INDEPENDENT_AMBULATORY_CARE_PROVIDER_SITE_OTHER): Payer: Medicare Other | Admitting: Cardiology

## 2020-03-31 ENCOUNTER — Encounter: Payer: Self-pay | Admitting: Cardiology

## 2020-03-31 ENCOUNTER — Other Ambulatory Visit: Payer: Self-pay

## 2020-03-31 VITALS — BP 136/84 | HR 62 | Ht 60.0 in | Wt 159.0 lb

## 2020-03-31 DIAGNOSIS — R7303 Prediabetes: Secondary | ICD-10-CM

## 2020-03-31 DIAGNOSIS — Z951 Presence of aortocoronary bypass graft: Secondary | ICD-10-CM

## 2020-03-31 DIAGNOSIS — I214 Non-ST elevation (NSTEMI) myocardial infarction: Secondary | ICD-10-CM | POA: Diagnosis not present

## 2020-03-31 DIAGNOSIS — R0989 Other specified symptoms and signs involving the circulatory and respiratory systems: Secondary | ICD-10-CM | POA: Diagnosis not present

## 2020-03-31 DIAGNOSIS — E785 Hyperlipidemia, unspecified: Secondary | ICD-10-CM

## 2020-03-31 NOTE — Patient Instructions (Signed)
Medication Instructions:  °Your physician recommends that you continue on your current medications as directed. Please refer to the Current Medication list given to you today. ° °*If you need a refill on your cardiac medications before your next appointment, please call your pharmacy* ° ° °Lab Work: °None. ° °If you have labs (blood work) drawn today and your tests are completely normal, you will receive your results only by: °• MyChart Message (if you have MyChart) OR °• A paper copy in the mail °If you have any lab test that is abnormal or we need to change your treatment, we will call you to review the results. ° ° °Testing/Procedures: °Your physician has requested that you have an echocardiogram. Echocardiography is a painless test that uses sound waves to create images of your heart. It provides your doctor with information about the size and shape of your heart and how well your heart’s chambers and valves are working. This procedure takes approximately one hour. There are no restrictions for this procedure. ° ° ° ° °Follow-Up: °At [facility], you and your health needs are our priority.  As part of our continuing mission to provide you with exceptional heart care, we have created designated Provider Care Teams.  These Care Teams include your primary Cardiologist (physician) and Advanced Practice Providers (APPs -  Physician Assistants and Nurse Practitioners) who all work together to provide you with the care you need, when you need it. ° °We recommend signing up for the patient portal called "MyChart".  Sign up information is provided on this After Visit Summary.  MyChart is used to connect with patients for Virtual Visits (Telemedicine).  Patients are able to view lab/test results, encounter notes, upcoming appointments, etc.  Non-urgent messages can be sent to your provider as well.   °To learn more about what you can do with MyChart, go to [email or web address].   ° °Your next appointment:   °6  month(s) ° °The format for your next appointment:   °In Person ° °Provider:   °Robert Krasowski, MD ° ° °Other Instructions ° ° °Echocardiogram °An echocardiogram is a procedure that uses painless sound waves (ultrasound) to produce an image of the heart. Images from an echocardiogram can provide important information about: °· Signs of coronary artery disease (CAD). °· Aneurysm detection. An aneurysm is a weak or damaged part of an artery wall that bulges out from the normal force of blood pumping through the body. °· Heart size and shape. Changes in the size or shape of the heart can be associated with certain conditions, including heart failure, aneurysm, and CAD. °· Heart muscle function. °· Heart valve function. °· Signs of a past heart attack. °· Fluid buildup around the heart. °· Thickening of the heart muscle. °· A tumor or infectious growth around the heart valves. °Tell a health care provider about: °· Any allergies you have. °· All medicines you are taking, including vitamins, herbs, eye drops, creams, and over-the-counter medicines. °· Any blood disorders you have. °· Any surgeries you have had. °· Any medical conditions you have. °· Whether you are pregnant or may be pregnant. °What are the risks? °Generally, this is a safe procedure. However, problems may occur, including: °· Allergic reaction to dye (contrast) that may be used during the procedure. °What happens before the procedure? °No specific preparation is needed. You may eat and drink normally. °What happens during the procedure? ° °· An IV tube may be inserted into one of your veins. °· You may   receive contrast through this tube. A contrast is an injection that improves the quality of the pictures from your heart. °· A gel will be applied to your chest. °· A wand-like tool (transducer) will be moved over your chest. The gel will help to transmit the sound waves from the transducer. °· The sound waves will harmlessly bounce off of your heart to  allow the heart images to be captured in real-time motion. The images will be recorded on a computer. °The procedure may vary among health care providers and hospitals. °What happens after the procedure? °· You may return to your normal, everyday life, including diet, activities, and medicines, unless your health care provider tells you not to do that. °Summary °· An echocardiogram is a procedure that uses painless sound waves (ultrasound) to produce an image of the heart. °· Images from an echocardiogram can provide important information about the size and shape of your heart, heart muscle function, heart valve function, and fluid buildup around your heart. °· You do not need to do anything to prepare before this procedure. You may eat and drink normally. °· After the echocardiogram is completed, you may return to your normal, everyday life, unless your health care provider tells you not to do that. °This information is not intended to replace advice given to you by your health care provider. Make sure you discuss any questions you have with your health care provider. °Document Revised: 08/22/2018 Document Reviewed: 06/03/2016 °Elsevier Patient Education © 2020 [employer] ° ° °

## 2020-03-31 NOTE — Progress Notes (Signed)
Cardiology Office Note:    Date:  03/31/2020   ID:  [name], DOB 1951-03-25, MRN [id number]  PCP:  Pearline Cables, MD  Cardiologist:  Gypsy Balsam, MD    Referring MD: Pearline Cables, MD   Chief Complaint  Patient presents with  . Follow-up  I am doing fair and short of breath  History of Present Illness:    [name] is a 69 y.o. female with past medical history significant for coronary artery disease, status post coronary bypass grafting 2019, preserved left ventricle ejection fraction, peripheral vascular disease however latest cardiac ultrasound did not show critical stenosis.  Comes today 2 months for follow-up.  Because of bandemia she does not exercise on the regular basis she complained of being tired exhausted and having some shortness of breath.  There is no chest pain tightness squeezing pressure burning chest.  However, even before her bypass surgery she did not have my symptomatology.  She was at the gym day before she end up coming to the hospital  Past Medical History:  Diagnosis Date  . Arthritis   . Bilateral carotid bruits    1-39% bilateral ICA stenosis  . Branch retinal vein occlusion of left eye with macular edema 01/09/2018  . Chicken pox   . Closed fracture of left wrist 12/13/2018   Formatting of this note might be different from the original. Added automatically from request for surgery [phone]  . Diverticulosis of colon 05/16/2015  . Dyslipidemia   . Genital herpes   . GERD (gastroesophageal reflux disease)   . Hepatitis B   . HSV-2 (herpes simplex virus 2) infection 06/11/2016  . Hypothyroidism   . Hypothyroidism (acquired) 06/11/2016  . Kidney stone   . NSTEMI (non-ST elevated myocardial infarction) (HCC) 04/15/2018   Troponin 0.58 on 04/15/18  . Osteoarthritis 06/11/2016  . Osteopenia 11/30/2017  . Pancreatitis   . Peripheral vascular disease, unspecified (HCC) right subclavian artery stenosis 04/18/2019  . Prediabetes 03/11/2020   . Pseudophakia of both eyes 02/18/2018  . PVD (posterior vitreous detachment), bilateral 02/18/2018  . S/P CABG x 4 04/15/18 04/17/2018   LIMA to LAD SVG to DIAGONAL SVG to OM SVG to RCA  . Systolic murmur     Past Surgical History:  Procedure Laterality Date  . APPENDECTOMY  1972  . CORONARY ARTERY BYPASS GRAFT N/A 04/17/2018   Procedure: CORONARY ARTERY BYPASS GRAFTING (CABG) TIMES USING LEFT INTERNAL MAMMARY ARTERY TO LAD AND SAPHENOUS VEIN TO DIAG 1, OM1, AND RCA;  Surgeon: Delight Ovens, MD;  Location: [facility] OR;  Service: Open Heart Surgery;  Laterality: N/A;  . ENDOVEIN HARVEST OF GREATER SAPHENOUS VEIN Right 04/17/2018   Procedure: ENDOVEIN HARVEST OF GREATER SAPHENOUS VEIN RIGHT THIGH AND CALF;  Surgeon: Delight Ovens, MD;  Location: [facility] OR;  Service: Open Heart Surgery;  Laterality: Right;  . LAPAROSCOPY    . LEFT HEART CATH AND CORONARY ANGIOGRAPHY N/A 04/16/2018   Procedure: LEFT HEART CATH AND CORONARY ANGIOGRAPHY;  Surgeon: Kathleene Hazel, MD;  Location: [facility] INVASIVE CV LAB;  Service: Cardiovascular;  Laterality: N/A;  . OVARIAN CYST SURGERY    . TEE WITHOUT CARDIOVERSION N/A 04/17/2018   Procedure: TRANSESOPHAGEAL ECHOCARDIOGRAM (TEE);  Surgeon: Delight Ovens, MD;  Location: [facility] OR;  Service: Open Heart Surgery;  Laterality: N/A;  . TONSILLECTOMY      Current Medications: Current Meds  Medication Sig  . acetaminophen (TYLENOL) 500 MG tablet Take 500 mg by mouth every 6 (six) hours  as needed.  Marland Kitchen acyclovir (ZOVIRAX) 400 MG tablet Take 1 tablet (400 mg total) by mouth 2 (two) times daily.  Marland Kitchen aspirin EC 81 MG EC tablet Take 1 tablet (81 mg total) by mouth daily.  Marland Kitchen atorvastatin (LIPITOR) 80 MG tablet Take 1 tablet (80 mg total) by mouth daily.  . cholecalciferol (VITAMIN D3) 25 MCG (1000 UNIT) tablet Take 2,000 Units by mouth daily.  Marland Kitchen estradiol (ESTRACE VAGINAL) 0.1 MG/GM vaginal cream Place 1.5 g vaginally at bedtime.  Marland Kitchen ezetimibe (ZETIA) 10 MG tablet Take 1  tablet (10 mg total) by mouth daily.  Marland Kitchen levothyroxine (SYNTHROID) 88 MCG tablet Take 1 tablet (88 mcg total) by mouth daily before breakfast.  . metoprolol tartrate (LOPRESSOR) 25 MG tablet Take 0.5 tablets (12.5 mg total) by mouth 2 (two) times daily.  Marland Kitchen omeprazole (PRILOSEC) 40 MG capsule Take 1 capsule (40 mg total) by mouth daily.     Allergies:   Dye fdc blue  [brilliant blue fcf], Contrast media [iodinated diagnostic agents], and Scopolamine   Social History   Socioeconomic History  . Marital status: Divorced    Spouse name: Not on file  . Number of children: Not on file  . Years of education: Not on file  . Highest education level: Not on file  Occupational History  . Not on file  Tobacco Use  . Smoking status: Former Games developer  . Smokeless tobacco: Never Used  Substance and Sexual Activity  . Alcohol use: No  . Drug use: No  . Sexual activity: Never  Other Topics Concern  . Not on file  Social History Narrative  . Not on file   Social Determinants of Health   Financial Resource Strain: Low Risk   . Difficulty of Paying Living Expenses: Not hard at all  Food Insecurity: No Food Insecurity  . Worried About Programme researcher, broadcasting/film/video in the Last Year: Never true  . Ran Out of Food in the Last Year: Never true  Transportation Needs: No Transportation Needs  . Lack of Transportation (Medical): No  . Lack of Transportation (Non-Medical): No  Physical Activity:   . Days of Exercise per Week: Not on file  . Minutes of Exercise per Session: Not on file  Stress:   . Feeling of Stress : Not on file  Social Connections:   . Frequency of Communication with Friends and Family: Not on file  . Frequency of Social Gatherings with Friends and Family: Not on file  . Attends Religious Services: Not on file  . Active Member of Clubs or Organizations: Not on file  . Attends Banker Meetings: Not on file  . Marital Status: Not on file     Family History: The patient's  family history includes Alcoholism in her father, maternal grandfather, and mother; Heart disease in her father; Heart failure in her father; Hyperlipidemia in her father; Kidney Stones in her mother; Kidney disease in her mother; Liver cancer in her mother. ROS:   Please see the history of present illness.    All 14 point review of systems negative except as described per history of present illness  EKGs/Labs/Other Studies Reviewed:      Recent Labs: 02/04/2020: ALT 17; BUN 9; Creat 0.77; Hemoglobin 14.3; Platelets 271; Potassium 4.0; Sodium 135 03/10/2020: TSH 0.70  Recent Lipid Panel    Component Value Date/Time   CHOL 143 09/22/2019 1130   TRIG 87 09/22/2019 1130   HDL 57 09/22/2019 1130   CHOLHDL 2.5 09/22/2019  1130   CHOLHDL 3.0 04/16/2018 0229   VLDL 14 04/16/2018 0229   LDLCALC 70 09/22/2019 1130    Physical Exam:    VS:  BP 136/84 (BP Location: Left Arm, Patient Position: Sitting)   Pulse 62   Ht 5' (1.524 m)   Wt 159 lb (72.1 kg)   SpO2 98%   BMI 31.05 kg/m     Wt Readings from Last 3 Encounters:  03/31/20 159 lb (72.1 kg)  03/10/20 160 lb (72.6 kg)  02/04/20 163 lb (73.9 kg)     GEN:  Well nourished, well developed in no acute distress HEENT: Normal NECK: No JVD; No carotid bruits LYMPHATICS: No lymphadenopathy CARDIAC: RRR, no murmurs, no rubs, no gallops RESPIRATORY:  Clear to auscultation without rales, wheezing or rhonchi  ABDOMEN: Soft, non-tender, non-distended MUSCULOSKELETAL:  No edema; No deformity  SKIN: Warm and dry LOWER EXTREMITIES: no swelling NEUROLOGIC:  Alert and oriented x 3 PSYCHIATRIC:  Normal affect   ASSESSMENT:    1. S/P CABG x 4 04/15/18   2. Dyslipidemia   3. Prediabetes   4. Bilateral carotid bruits    PLAN:    In order of problems listed above:  1. Status post coronary bypass grafting well from that point review I will ask her to have echocardiogram to assess left ventricle ejection fraction.  Because of shortness of  breath and fatigue which I suspect mostly is related to simply deconditioning I offer her stress test however she declined. 2. Dyslipidemia: I did review her K PN which show me her LDL of 70 HDL 57 this is on high intensity statin with in form of Lipitor 80 as well as Zetia 10 I will continue. 3. Prediabetes I did review her K PN which show me her hemoglobin A1c 5.7 from March 10, 2020. 4. Bilateral carotic bruit I cannot hear it today. 5. Plan will be to reassess her echocardiogram if echocardiogram showed preserved ejection fraction encouraged her to be a little more active if she develop some chest pain she gets need to let me know and I will pursue ischemia work-up.   Medication Adjustments/Labs and Tests Ordered: Current medicines are reviewed at length with the patient today.  Concerns regarding medicines are outlined above.  No orders of the defined types were placed in this encounter.  Medication changes: No orders of the defined types were placed in this encounter.   Signed, Georgeanna Lea, MD, [facility] 03/31/2020 10:59 AM    [facility]

## 2020-04-02 ENCOUNTER — Other Ambulatory Visit: Payer: Self-pay | Admitting: Physician Assistant

## 2020-04-02 IMAGING — DX DG CHEST 1V PORT
1 series · 1 of 1 positions shown · non-contrast
Comparison: 04/17/2018.

CLINICAL DATA: CABG.  Sore chest.

EXAM:
PORTABLE CHEST 1 VIEW

[chest]
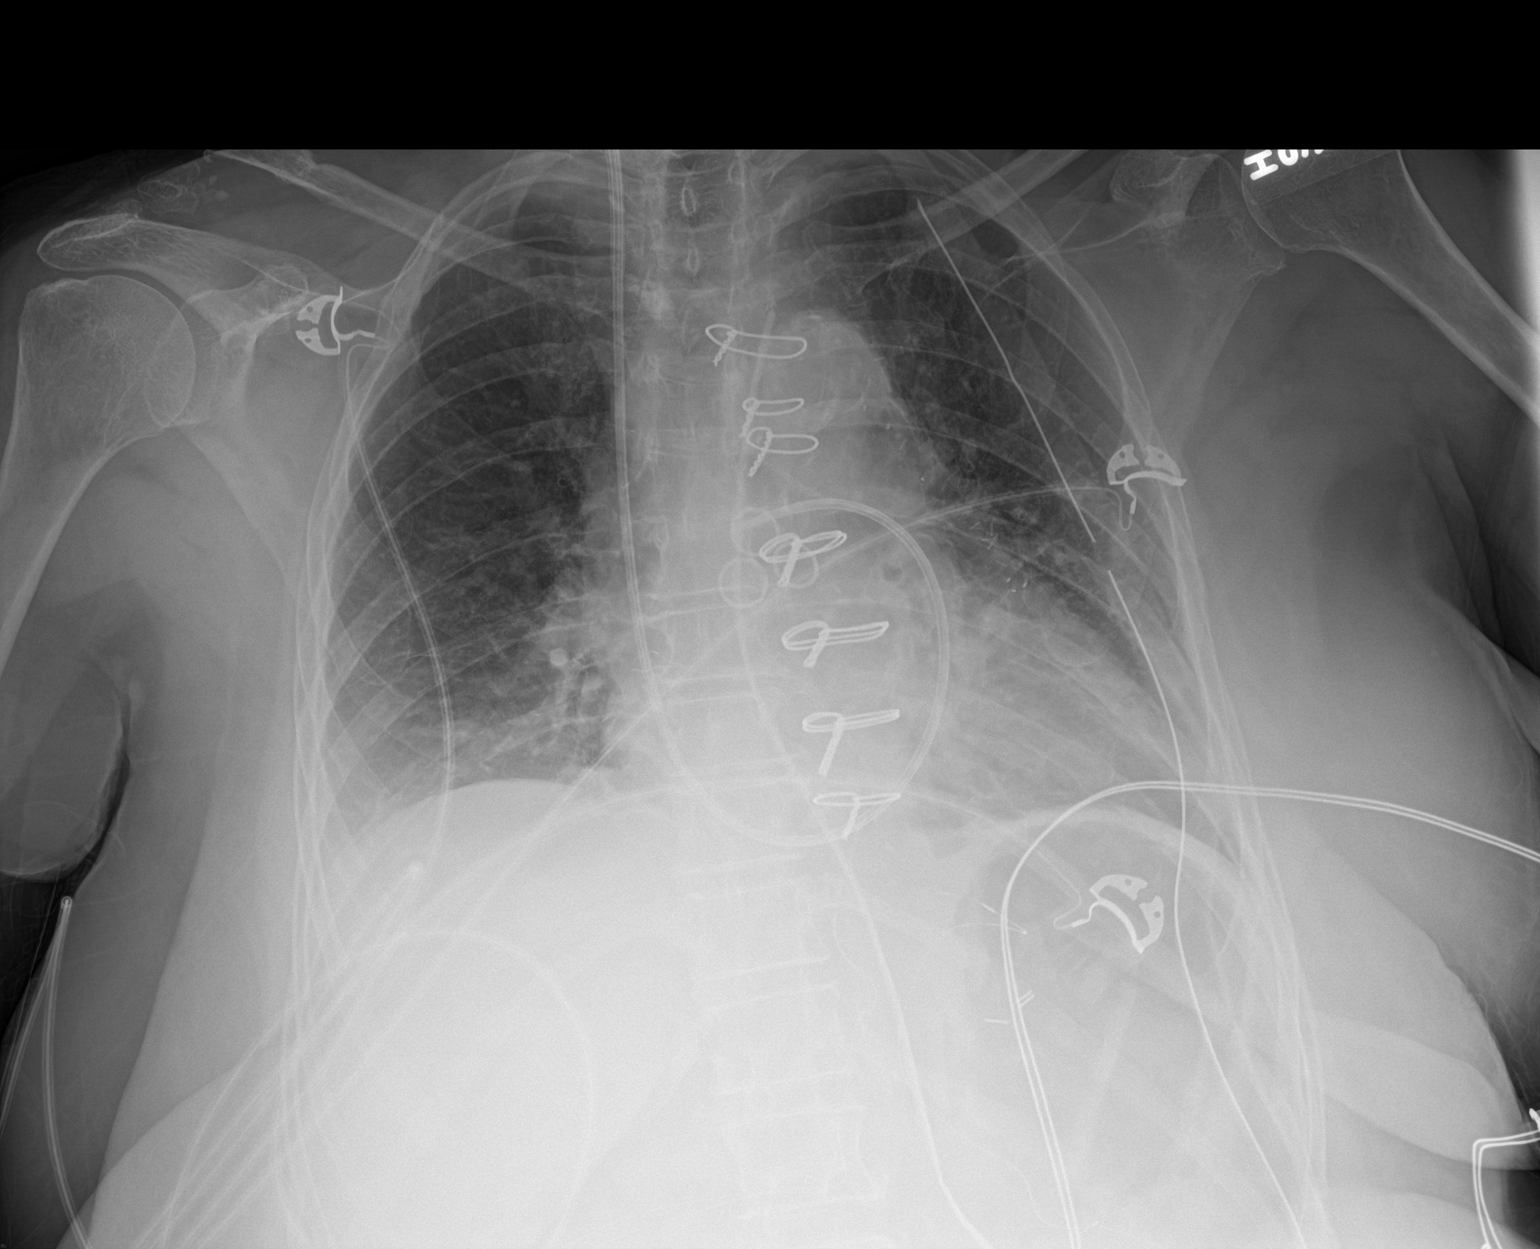

[1 of 1 positions shown; findings below may reference images not displayed]

FINDINGS: Interim removal of endotracheal tube. Left chest tube, Swan-Ganz
catheter, mediastinal drainage catheter in stable position. Prior
CABG. Cardiomegaly. Interim partial resolution of bibasilar
atelectasis. No prominent pleural effusion. No pneumothorax. No
acute bony abnormality. Thoracic spine scoliosis.
IMPRESSION: 1. Interim removal of endotracheal tube. Remaining lines and tubes
including left chest tube stable position. No pneumothorax.

2. Prior CABG. Stable cardiomegaly. Interim partial resolution of
bibasilar atelectasis.

## 2020-04-02 MED ORDER — EZETIMIBE 10 MG PO TABS: 10.0000 mg | ORAL_TABLET | Freq: Every day | ORAL | 3 refills | Status: DC

## 2020-04-02 MED ORDER — ATORVASTATIN CALCIUM 80 MG PO TABS: 80.0000 mg | ORAL_TABLET | Freq: Every day | ORAL | 3 refills | Status: DC

## 2020-04-02 MED ORDER — METOPROLOL TARTRATE 25 MG PO TABS: 12.5000 mg | ORAL_TABLET | Freq: Two times a day (BID) | ORAL | 1 refills | Status: DC

## 2020-04-04 IMAGING — DX DG CHEST 1V PORT
1 series · 1 of 1 positions shown · non-contrast
Comparison: 04/19/2018

CLINICAL DATA: Postop from CABG.  Left chest tube removal.

EXAM:
PORTABLE CHEST 1 VIEW

[chest]
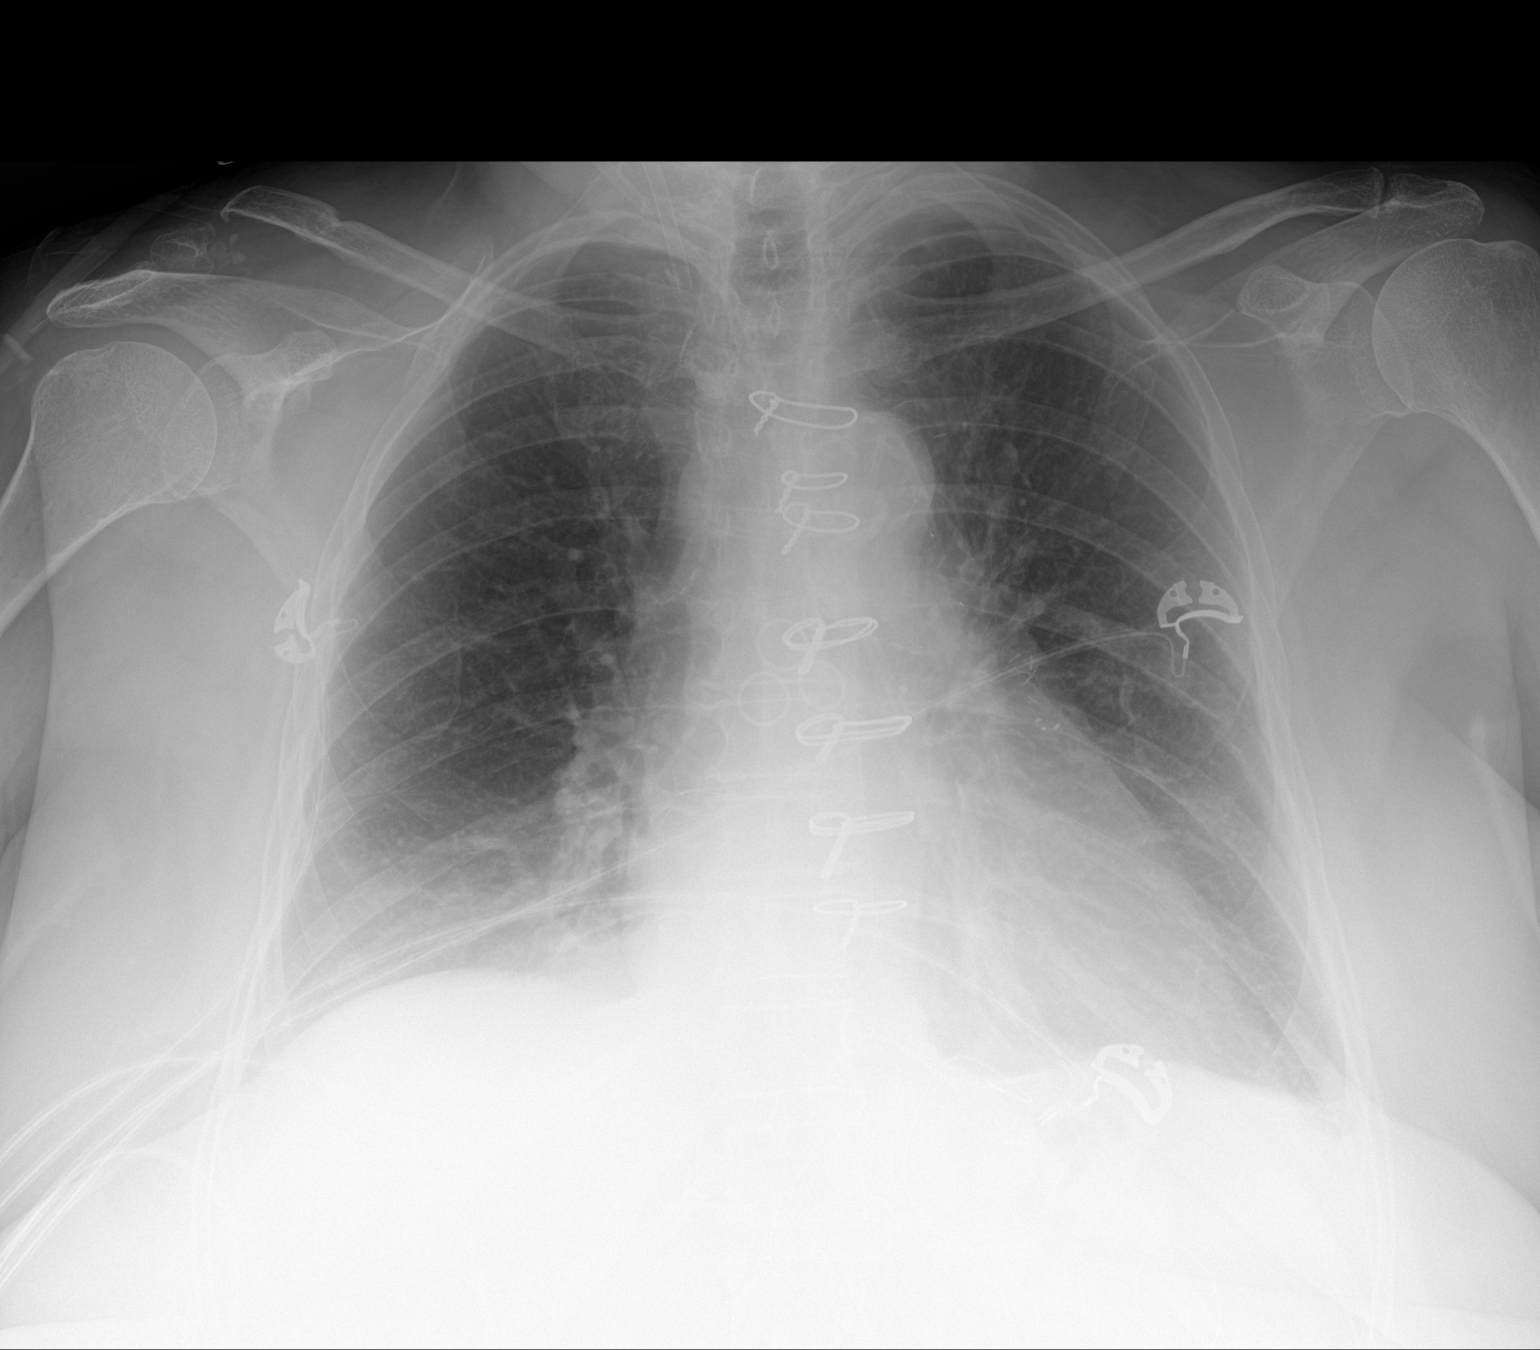

[1 of 1 positions shown; findings below may reference images not displayed]

FINDINGS: Left chest tube is been removed since previous study. No
pneumothorax visualized. Right jugular Cordis remains in place. Mild
atelectasis in right lung base shows no significant change. No
evidence of pulmonary edema or consolidation. Heart size is stable
and within normal limits.
IMPRESSION: No evidence of pneumothorax following left chest removal.

Stable mild right basilar atelectasis.

## 2020-04-12 ENCOUNTER — Encounter: Payer: Self-pay | Admitting: Family Medicine

## 2020-04-23 ENCOUNTER — Other Ambulatory Visit: Payer: Self-pay

## 2020-04-23 ENCOUNTER — Telehealth: Payer: Self-pay | Admitting: Cardiology

## 2020-04-23 ENCOUNTER — Ambulatory Visit (HOSPITAL_BASED_OUTPATIENT_CLINIC_OR_DEPARTMENT_OTHER)
Admission: RE | Admit: 2020-04-23 | Discharge: 2020-04-23 | Disposition: A | Discharge status: Home / Self Care | Payer: Medicare Other | Source: Ambulatory Visit | Attending: Cardiology | Admitting: Cardiology

## 2020-04-23 DIAGNOSIS — I214 Non-ST elevation (NSTEMI) myocardial infarction: Secondary | ICD-10-CM | POA: Insufficient documentation

## 2020-04-23 DIAGNOSIS — R7303 Prediabetes: Secondary | ICD-10-CM | POA: Insufficient documentation

## 2020-04-23 DIAGNOSIS — R0989 Other specified symptoms and signs involving the circulatory and respiratory systems: Secondary | ICD-10-CM | POA: Diagnosis not present

## 2020-04-23 DIAGNOSIS — E785 Hyperlipidemia, unspecified: Secondary | ICD-10-CM | POA: Diagnosis not present

## 2020-04-23 DIAGNOSIS — Z951 Presence of aortocoronary bypass graft: Secondary | ICD-10-CM | POA: Diagnosis not present

## 2020-04-23 DIAGNOSIS — I252 Old myocardial infarction: Secondary | ICD-10-CM | POA: Diagnosis not present

## 2020-04-23 LAB — ECHOCARDIOGRAM COMPLETE
Area-P 1/2: 3.42 cm2
S' Lateral: 2.55 cm

## 2020-04-23 NOTE — Telephone Encounter (Signed)
[name] is returning [name]'s call in regards to her Echo results. Please advise.

## 2020-04-23 NOTE — Telephone Encounter (Signed)
Called patient informed her of results.  

## 2020-04-30 ENCOUNTER — Ambulatory Visit: Payer: Medicare Other | Admitting: Gastroenterology

## 2020-05-13 ENCOUNTER — Other Ambulatory Visit (HOSPITAL_COMMUNITY)
Admission: RE | Admit: 2020-05-13 | Discharge: 2020-05-13 | Disposition: A | Discharge status: Home / Self Care | Payer: Medicare Other | Source: Ambulatory Visit | Attending: Obstetrics & Gynecology | Admitting: Obstetrics & Gynecology

## 2020-05-13 ENCOUNTER — Encounter: Payer: Self-pay | Admitting: Obstetrics & Gynecology

## 2020-05-13 ENCOUNTER — Other Ambulatory Visit: Payer: Self-pay

## 2020-05-13 ENCOUNTER — Ambulatory Visit (INDEPENDENT_AMBULATORY_CARE_PROVIDER_SITE_OTHER): Payer: Medicare Other | Admitting: Obstetrics & Gynecology

## 2020-05-13 VITALS — BP 136/83 | HR 69 | Ht 59.0 in | Wt 163.0 lb

## 2020-05-13 DIAGNOSIS — N9089 Other specified noninflammatory disorders of vulva and perineum: Secondary | ICD-10-CM | POA: Diagnosis not present

## 2020-05-13 DIAGNOSIS — N898 Other specified noninflammatory disorders of vagina: Secondary | ICD-10-CM | POA: Insufficient documentation

## 2020-05-13 DIAGNOSIS — N904 Leukoplakia of vulva: Secondary | ICD-10-CM

## 2020-05-13 DIAGNOSIS — R102 Pelvic and perineal pain: Secondary | ICD-10-CM | POA: Diagnosis not present

## 2020-05-13 NOTE — Progress Notes (Signed)
Patient presents for pessary fitting. Armandina Stammer RN  Needs refill on Estrace.

## 2020-05-13 NOTE — Progress Notes (Signed)
History:  69 y.o. G2P1011 here today for eval of uterine prolapse. Pt reports that she has had vulvar pain and she feels that it is related to the placement of the pessary.  She has not had it in since Oct due to the pain. Pt has a h/o HSV and she is on daily antiviral prophylaxis. She is not sure if this was an HSV outbreak but, thinks that it very well could be.         The following portions of the patient's history were reviewed and updated as appropriate: allergies, current medications, past family history, past medical history, past social history, past surgical history and problem list.  Review of Systems:  Pertinent items are noted in HPI.    Objective:  Physical Exam Blood pressure 136/83, pulse 69, height 4\' 11"  (1.499 m), weight 163 lb (73.9 kg).  CONSTITUTIONAL: Well-developed, well-nourished female in no acute distress.  HENT:  Normocephalic, atraumatic EYES: Conjunctivae and EOM are normal. No scleral icterus.  NECK: Normal range of motion SKIN: Skin is warm and dry. No rash noted. Not diaphoretic.No pallor. NEUROLGIC: Alert and oriented to person, place, and time. Normal coordination.  Abd: Soft, nontender and nondistended Pelvic: The ext genitalia has white lesions and appears to have multiple healing circular lesions. The entire vulva is tender. There are no excoriations. The skin is intact and most of the lesions look like they are well healed. The vaginal tissue is intact and is healthy in appearance. Normal discharge. The cervix is healthy in appearance. The uterus is small and prolapsed. The bladder and the rectum are all prolapsed. I did not have pt perform Valsalva.   VULVAR BIOPSY NOTE The indications for vulvar biopsy (rule out neoplasia, establish lichen sclerosus diagnosis) were reviewed.   Risks of the biopsy including pain, bleeding, infection, inadequate specimen, scarring and need for additional procedures  were discussed. The patient stated understanding and  agreed to undergo procedure today. Consent was signed,  time out performed.  The patient's vulva was prepped with Betadine. 1% lidocaine was injected into labia minora in 3 locations.  A 4-mm punch biopsy was done, biopsy tissue was picked up with sterile forceps and sterile scissors were used to excise the lesion.  Small bleeding was noted and hemostasis was achieved using Monsel's solution.  The patient tolerated the procedure well. Post-procedure instructions  (pelvic rest for one week) were given to the patient. The patient is to call with heavy bleeding, fever greater than 100.4, foul smelling vaginal discharge or other concerns. The patient will be return to clinic in two weeks for discussion of results.   Assessment & Plan:  Vulvar Pain in the face of prolapse. Given the vulvar pain, we will NOT attempt to replace her pessary today. Will f/u on the bx. It is unclear if the lesions were related to an HSV outbreak. But, the sx should not have persisted since Oct if that was the initial etiology.   F/u surg path F/u in 4 weeks or sooner prn  [name], M.D., Nov

## 2020-05-16 LAB — CERVICOVAGINAL ANCILLARY ONLY
Bacterial Vaginitis (gardnerella): NEGATIVE
Candida Glabrata: NEGATIVE
Candida Vaginitis: NEGATIVE
Comment: NEGATIVE
Comment: NEGATIVE
Comment: NEGATIVE

## 2020-05-17 ENCOUNTER — Other Ambulatory Visit: Payer: Self-pay

## 2020-05-19 LAB — SURGICAL PATHOLOGY

## 2020-05-24 ENCOUNTER — Ambulatory Visit (INDEPENDENT_AMBULATORY_CARE_PROVIDER_SITE_OTHER): Payer: Medicare Other | Admitting: Obstetrics & Gynecology

## 2020-05-24 ENCOUNTER — Other Ambulatory Visit: Payer: Self-pay

## 2020-05-24 ENCOUNTER — Encounter: Payer: Self-pay | Admitting: Obstetrics & Gynecology

## 2020-05-24 VITALS — BP 135/82 | HR 88 | Wt 163.0 lb

## 2020-05-24 DIAGNOSIS — N814 Uterovaginal prolapse, unspecified: Secondary | ICD-10-CM

## 2020-05-24 DIAGNOSIS — R102 Pelvic and perineal pain: Secondary | ICD-10-CM | POA: Diagnosis not present

## 2020-05-24 DIAGNOSIS — L280 Lichen simplex chronicus: Secondary | ICD-10-CM

## 2020-05-24 MED ORDER — CLOBETASOL PROPIONATE 0.05 % EX CREA: 1.0000 "application " | TOPICAL_CREAM | Freq: Two times a day (BID) | CUTANEOUS | 2 refills | Status: DC

## 2020-05-24 NOTE — Progress Notes (Signed)
History:  70 y.o. G2P1011 here today for eval of vulvar pain. The area that she was seen for prev is now more tender. She was worriedd about HSV so she increased the dosage of her antivirals. She also wants her pessary replaced. She would like to be refitted at some point.     The following portions of the patient's history were reviewed and updated as appropriate: allergies, current medications, past family history, past medical history, past social history, past surgical history and problem list.  Review of Systems:  Pertinent items are noted in HPI.    Objective:  Physical Exam Blood pressure 135/82, pulse 88, weight 163 lb (73.9 kg).  CONSTITUTIONAL: Well-developed, well-nourished female in no acute distress.  HENT:  Normocephalic, atraumatic EYES: Conjunctivae and EOM are normal. No scleral icterus.  NECK: Normal range of motion SKIN: Skin is warm and dry. No rash noted. Not diaphoretic.No pallor. NEUROLGIC: Alert and oriented to person, place, and time. Normal coordination.   Pelvic: External genitalia- the site from her bx is healing well. The leukoplakia from the last visit remains and is unchanged. Normal discharge.  Pessary reinserted  Labs and Imaging  05/17/2020 VULVA, BIOPSY: LICHENOID REACTION  MICROSCOPIC DESCRIPTION: There is a patchy lichenoid lymphocytic  infiltrate that focally obscures the basement membranes where there is  vacuolar change and a few necrotic keratinocytes. No fungal organism are  present on PAS stain. The findings are those of a lichenoid dermatitis  which is not completely diagnostic. The differential diagnosis includes  early lichen sclerosus and less specific lichenoid eruptions, such as  secondary to drugs. Deeper sections reviewed.   Assessment & Plan:  Pt with vulvar pain and leukoplakia. The bx were nonspecific (see above).   Will treat with clobetasol bid   F/u in 6 weeks or sooner prn  Uterine prolapse  The pessary was replaced  Pt  would like to have refitted at another time. She feels that this one is too small  She is not interested in surgery at present  Total face-to-face time with patient was .  Greater than 50% was spent in counseling and coordination of care with the patient.   Ailany L. Harraway-Smith, M.D., [facility]

## 2020-05-24 NOTE — Progress Notes (Signed)
Patient states that her vulvar pain has increased. Armandina Stammer RN

## 2020-06-11 ENCOUNTER — Encounter: Payer: Self-pay | Admitting: Family Medicine

## 2020-06-11 ENCOUNTER — Other Ambulatory Visit: Payer: Self-pay

## 2020-06-11 DIAGNOSIS — B009 Herpesviral infection, unspecified: Secondary | ICD-10-CM

## 2020-06-11 MED ORDER — ACYCLOVIR 400 MG PO TABS: 400.0000 mg | ORAL_TABLET | Freq: Two times a day (BID) | ORAL | 3 refills | Status: DC

## 2020-06-18 ENCOUNTER — Encounter: Payer: Medicare Other | Admitting: Obstetrics & Gynecology

## 2020-07-05 ENCOUNTER — Encounter: Payer: Self-pay | Admitting: Obstetrics & Gynecology

## 2020-07-05 ENCOUNTER — Ambulatory Visit (INDEPENDENT_AMBULATORY_CARE_PROVIDER_SITE_OTHER): Payer: Medicare Other | Admitting: Obstetrics & Gynecology

## 2020-07-05 ENCOUNTER — Other Ambulatory Visit: Payer: Self-pay

## 2020-07-05 VITALS — BP 112/84 | Wt 163.1 lb

## 2020-07-05 DIAGNOSIS — L292 Pruritus vulvae: Secondary | ICD-10-CM

## 2020-07-05 NOTE — Progress Notes (Signed)
History:  70 y.o. G2P1011 here today for f/u of vulvar pain. Pt was started on clobetasol on her last visit. She reports that as of 1 month of the cream, her sx have completely resolved. She is having no issues at present with the pessary. She is removing it on her own at home. She is worried about being on the sterioid cream long term because of her h/o genital HSV.   The following portions of the patient's history were reviewed and updated as appropriate: allergies, current medications, past family history, past medical history, past social history, past surgical history and problem list.  Review of Systems:  Pertinent items are noted in HPI.    Objective:  Physical Exam Blood pressure 112/84, weight 163 lb 1.3 oz (74 kg).  CONSTITUTIONAL: Well-developed, well-nourished female in no acute distress.  HENT:  Normocephalic, atraumatic EYES: Conjunctivae and EOM are normal. No scleral icterus.  NECK: Normal range of motion SKIN: Skin is warm and dry. No rash noted. Not diaphoretic.No pallor. NEUROLGIC: Alert and oriented to person, place, and time. Normal coordination.  Pelvic: Normal appearing external genitalia with atrophy. No lesions noted. Pessary seen. Not removed.     Assessment & Plan:  Vulvar itching. Improved with steroid cream.  decrease use from bid to daily from 1 month then d/c  F/u in 3 months prn. Pt will cancel if she has no problem removing the pesary hesself.   Rayn L. Harraway-Smith, M.D., [facility]

## 2020-07-09 ENCOUNTER — Encounter: Payer: Self-pay | Admitting: Family Medicine

## 2020-07-09 DIAGNOSIS — K219 Gastro-esophageal reflux disease without esophagitis: Secondary | ICD-10-CM

## 2020-07-09 MED ORDER — OMEPRAZOLE 40 MG PO CPDR: 40.0000 mg | DELAYED_RELEASE_CAPSULE | Freq: Every day | ORAL | 3 refills | Status: DC

## 2020-07-09 NOTE — Addendum Note (Signed)
Addended by: Pearline Cables on: 07/09/2020 05:38 PM   Modules accepted: Orders

## 2020-07-09 NOTE — Telephone Encounter (Signed)
Please advise on medication refill. There is a contradiction.

## 2020-07-21 DIAGNOSIS — H34832 Tributary (branch) retinal vein occlusion, left eye, with macular edema: Secondary | ICD-10-CM | POA: Diagnosis not present

## 2020-07-21 DIAGNOSIS — H35352 Cystoid macular degeneration, left eye: Secondary | ICD-10-CM | POA: Diagnosis not present

## 2020-07-21 DIAGNOSIS — H43813 Vitreous degeneration, bilateral: Secondary | ICD-10-CM | POA: Diagnosis not present

## 2020-07-21 DIAGNOSIS — Z961 Presence of intraocular lens: Secondary | ICD-10-CM | POA: Diagnosis not present

## 2020-08-20 ENCOUNTER — Ambulatory Visit: Payer: Medicare Other

## 2020-08-20 NOTE — Progress Notes (Signed)
   Covid-19 Vaccination Clinic  Name:  [name]    MRN: [id number] DOB: 05-04-1951  08/20/2020  [name] was observed post Covid-19 immunization for 15 minutes without incident. She was provided with Vaccine Information Sheet and instruction to access the V-Safe system.   [name] was instructed to call 911 with any severe reactions post vaccine: Marland Kitchen Difficulty breathing  . Swelling of face and throat  . A fast heartbeat  . A bad rash all over body  . Dizziness and weakness

## 2020-08-27 ENCOUNTER — Other Ambulatory Visit (HOSPITAL_BASED_OUTPATIENT_CLINIC_OR_DEPARTMENT_OTHER): Payer: Self-pay

## 2020-08-27 MED ORDER — PFIZER-BIONT COVID-19 VAC-TRIS 30 MCG/0.3ML IM SUSP
INTRAMUSCULAR | 0 refills | Status: DC
  Filled 2020-08-27: qty 0.3, 1d supply, fill #0

## 2020-08-31 ENCOUNTER — Other Ambulatory Visit: Payer: Self-pay | Admitting: Family Medicine

## 2020-08-31 DIAGNOSIS — E034 Atrophy of thyroid (acquired): Secondary | ICD-10-CM

## 2020-09-09 ENCOUNTER — Ambulatory Visit (INDEPENDENT_AMBULATORY_CARE_PROVIDER_SITE_OTHER): Payer: Medicare Other

## 2020-09-09 VITALS — Ht 60.0 in | Wt 163.0 lb

## 2020-09-09 DIAGNOSIS — Z0000 Encounter for general adult medical examination without abnormal findings: Secondary | ICD-10-CM | POA: Diagnosis not present

## 2020-09-09 NOTE — Patient Instructions (Signed)
[name] , Thank you for taking time to complete  your Medicare Wellness Visit. I appreciate your ongoing commitment to your health goals. Please review the following plan we discussed and let me know if I can assist you in the future.   Screening recommendations/referrals: Colonoscopy: Cologuard due-Declined today. Please call the office if you change your mind. Mammogram: Due-Declined today. Please call the office if you change your mind. Bone Density: Due-Declined today. Please call the office if you change your mind. Recommended yearly ophthalmology/optometry visit for glaucoma screening and checkup Recommended yearly dental visit for hygiene and checkup  Vaccinations: Influenza vaccine: Up to date Pneumococcal vaccine: Completed vaccines Tdap vaccine: Up to date-Due-05/16/2023 Shingles vaccine: Declined Covid-19: Up to date  Advanced directives: Please bring a copy for your chart  Conditions/risks identified: See problem list  Next appointment: Follow up in one year for your annual wellness visit    Preventive Care 65 Years and Older, Female Preventive care refers to lifestyle choices and visits with your health care provider that can promote health and wellness. What does preventive care include?  A yearly physical exam. This is also called an annual well check.  Dental exams once or twice a year.  Routine eye exams. Ask your health care provider how often you should have your eyes checked.  Personal lifestyle choices, including:  Daily care of your teeth and gums.  Regular physical activity.  Eating a healthy diet.  Avoiding tobacco and drug use.  Limiting alcohol use.  Practicing safe sex.  Taking low-dose aspirin every day.  Taking vitamin and mineral supplements as recommended by your health care provider. What happens during an annual well check? The services and screenings done by your health care provider during your annual well check will depend on  your age, overall health, lifestyle risk factors, and family history of disease. Counseling  Your health care provider may ask you questions about your:  Alcohol use.  Tobacco use.  Drug use.  Emotional well-being.  Home and relationship well-being.  Sexual activity.  Eating habits.  History of falls.  Memory and ability to understand (cognition).  Work and work Astronomer.  Reproductive health. Screening  You may have the following tests or measurements:  Height, weight, and BMI.  Blood pressure.  Lipid and cholesterol levels. These may be checked every 5 years, or more frequently if you are over 65 years old.  Skin check.  Lung cancer screening. You may have this screening every year starting at age 73 if you have a 30-pack-year history of smoking and currently smoke or have quit within the past 15 years.  Fecal occult blood test (FOBT) of the stool. You may have this test every year starting at age 40.  Flexible sigmoidoscopy or colonoscopy. You may have a sigmoidoscopy every 5 years or a colonoscopy every 10 years starting at age 35.  Hepatitis C blood test.  Hepatitis B blood test.  Sexually transmitted disease (STD) testing.  Diabetes screening. This is done by checking your blood sugar (glucose) after you have not eaten for a while (fasting). You may have this done every 1-3 years.  Bone density scan. This is done to screen for osteoporosis. You may have this done starting at age 74.  Mammogram. This may be done every 1-2 years. Talk to your health care provider about how often you should have regular mammograms. Talk with your health care provider about your test results, treatment options, and if necessary, the need for more  tests. Vaccines  Your health care provider may recommend certain vaccines, such as:  Influenza vaccine. This is recommended every year.  Tetanus, diphtheria, and acellular pertussis (Tdap, Td) vaccine. You may need a Td booster  every 10 years.  Zoster vaccine. You may need this after age 18.  Pneumococcal 13-valent conjugate (PCV13) vaccine. One dose is recommended after age 1.  Pneumococcal polysaccharide (PPSV23) vaccine. One dose is recommended after age 14. Talk to your health care provider about which screenings and vaccines you need and how often you need them. This information is not intended to replace advice given to you by your health care provider. Make sure you discuss any questions you have with your health care provider. Document Released: 05/28/2015 Document Revised: 01/19/2016 Document Reviewed: 03/02/2015 Elsevier Interactive Patient Education  2017 [place] Prevention in the Home Falls can cause injuries. They can happen to people of all ages. There are many things you can do to make your home safe and to help prevent falls. What can I do on the outside of my home?  Regularly fix the edges of walkways and driveways and fix any cracks.  Remove anything that might make you trip as you walk through a door, such as a raised step or threshold.  Trim any bushes or trees on the path to your home.  Use bright outdoor lighting.  Clear any walking paths of anything that might make someone trip, such as rocks or tools.  Regularly check to see if handrails are loose or broken. Make sure that both sides of any steps have handrails.  Any raised decks and porches should have guardrails on the edges.  Have any leaves, snow, or ice cleared regularly.  Use sand or salt on walking paths during winter.  Clean up any spills in your garage right away. This includes oil or grease spills. What can I do in the bathroom?  Use night lights.  Install grab bars by the toilet and in the tub and shower. Do not use towel bars as grab bars.  Use non-skid mats or decals in the tub or shower.  If you need to sit down in the shower, use a plastic, non-slip stool.  Keep the floor dry. Clean up any  water that spills on the floor as soon as it happens.  Remove soap buildup in the tub or shower regularly.  Attach bath mats securely with double-sided non-slip rug tape.  Do not have throw rugs and other things on the floor that can make you trip. What can I do in the bedroom?  Use night lights.  Make sure that you have a light by your bed that is easy to reach.  Do not use any sheets or blankets that are too big for your bed. They should not hang down onto the floor.  Have a firm chair that has side arms. You can use this for support while you get dressed.  Do not have throw rugs and other things on the floor that can make you trip. What can I do in the kitchen?  Clean up any spills right away.  Avoid walking on wet floors.  Keep items that you use a lot in easy-to-reach places.  If you need to reach something above you, use a strong step stool that has a grab bar.  Keep electrical cords out of the way.  Do not use floor polish or wax that makes floors slippery. If you must use wax, use non-skid floor  wax.  Do not have throw rugs and other things on the floor that can make you trip. What can I do with my stairs?  Do not leave any items on the stairs.  Make sure that there are handrails on both sides of the stairs and use them. Fix handrails that are broken or loose. Make sure that handrails are as long as the stairways.  Check any carpeting to make sure that it is firmly attached to the stairs. Fix any carpet that is loose or worn.  Avoid having throw rugs at the top or bottom of the stairs. If you do have throw rugs, attach them to the floor with carpet tape.  Make sure that you have a light switch at the top of the stairs and the bottom of the stairs. If you do not have them, ask someone to add them for you. What else can I do to help prevent falls?  Wear shoes that:  Do not have high heels.  Have rubber bottoms.  Are comfortable and fit you well.  Are closed  at the toe. Do not wear sandals.  If you use a stepladder:  Make sure that it is fully opened. Do not climb a closed stepladder.  Make sure that both sides of the stepladder are locked into place.  Ask someone to hold it for you, if possible.  Clearly mark and make sure that you can see:  Any grab bars or handrails.  First and last steps.  Where the edge of each step is.  Use tools that help you move around (mobility aids) if they are needed. These include:  Canes.  Walkers.  Scooters.  Crutches.  Turn on the lights when you go into a dark area. Replace any light bulbs as soon as they burn out.  Set up your furniture so you have a clear path. Avoid moving your furniture around.  If any of your floors are uneven, fix them.  If there are any pets around you, be aware of where they are.  Review your medicines with your doctor. Some medicines can make you feel dizzy. This can increase your chance of falling. Ask your doctor what other things that you can do to help prevent falls. This information is not intended to replace advice given to you by your health care provider. Make sure you discuss any questions you have with your health care provider. Document Released: 02/25/2009 Document Revised: 10/07/2015 Document Reviewed: 06/05/2014 Elsevier Interactive Patient Education  2017 [employer].

## 2020-09-09 NOTE — Progress Notes (Signed)
Subjective:   [name] is a 70 y.o. female who presents for Medicare Annual (Subsequent) preventive examination.  I connected with [name] today by telephone and verified that I am speaking with the correct person using two identifiers. Location patient: home Location provider: work Persons participating in the virtual visit: patient, Engineer, civil (consulting).    I discussed the limitations, risks, security and privacy concerns of performing an evaluation and management service by telephone and the availability of in person appointments. I also discussed with the patient that there may be a patient responsible charge related to this service. The patient expressed understanding and verbally consented to this telephonic visit.    Interactive audio and video telecommunications were attempted between this provider and patient, however failed, due to patient having technical difficulties OR patient did not have access to video capability.  We continued and completed visit with audio only.  Some vital signs may be absent or patient reported.   Time Spent with patient on telephone encounter: 25 minutes   Review of Systems     Cardiac Risk Factors include: advanced age (>73men, >39 women);dyslipidemia;obesity (BMI >30kg/m2)     Objective:    Today's Vitals   09/09/20 1239  Weight: 163 lb (73.9 kg)  Height: 5' (1.524 m)   Body mass index is 31.83 kg/m.  Advanced Directives 09/09/2020 09/04/2019 12/09/2018 04/16/2018  Does Patient Have a Medical Advance Directive? Yes Yes Yes No  Type of Estate agent of [place];Living will Living will;Healthcare Power of [employer] Power of Attorney -  Does patient want to make changes to medical advance directive? - No - Patient declined - -  Copy of Healthcare Power of Attorney in Chart? No - copy requested No - copy requested - -  Would patient like information on creating a medical advance directive? - - - Yes (Inpatient - patient  requests chaplain consult to create a medical advance directive)    Current Medications (verified) Outpatient Encounter Medications as of 09/09/2020  Medication Sig  . acetaminophen (TYLENOL) 500 MG tablet Take 500 mg by mouth every 6 (six) hours as needed.  Marland Kitchen acyclovir (ZOVIRAX) 400 MG tablet Take 1 tablet (400 mg total) by mouth 2 (two) times daily.  Marland Kitchen aspirin EC 81 MG EC tablet Take 1 tablet (81 mg total) by mouth daily.  Marland Kitchen atorvastatin (LIPITOR) 80 MG tablet Take 1 tablet (80 mg total) by mouth daily.  . cholecalciferol (VITAMIN D3) 25 MCG (1000 UNIT) tablet Take 2,000 Units by mouth daily.  . clobetasol cream (TEMOVATE) 0.05 % Apply 1 application topically 2 (two) times daily. Apply to affected area twice a day  . COVID-19 mRNA Vac-TriS, Pfizer, (PFIZER-BIONT COVID-19 VAC-TRIS) SUSP injection Inject into the muscle.  Marland Kitchen COVID-19 mRNA vaccine, Pfizer, 30 MCG/0.3ML injection AS DIRECTED  . ezetimibe (ZETIA) 10 MG tablet Take 1 tablet (10 mg total) by mouth daily.  Marland Kitchen levothyroxine (SYNTHROID) 88 MCG tablet Take 1 tablet (88 mcg total) by mouth daily before breakfast.  . metoprolol tartrate (LOPRESSOR) 25 MG tablet Take 0.5 tablets (12.5 mg total) by mouth 2 (two) times daily.  Marland Kitchen omeprazole (PRILOSEC) 40 MG capsule Take 1 capsule (40 mg total) by mouth daily.  Marland Kitchen estradiol (ESTRACE VAGINAL) 0.1 MG/GM vaginal cream Place 1.5 g vaginally at bedtime.   No facility-administered encounter medications on file as of 09/09/2020.    Allergies (verified) Dye fdc blue  [brilliant blue fcf], Contrast media [iodinated diagnostic agents], and Scopolamine   History: Past Medical History:  Diagnosis Date  . Arthritis   . Bilateral carotid bruits    1-39% bilateral ICA stenosis  . Branch retinal vein occlusion of left eye with macular edema 01/09/2018  . Chicken pox   . Closed fracture of left wrist 12/13/2018   Formatting of this note might be different from the original. Added automatically from  request for surgery [id number]  . Diverticulosis of colon 05/16/2015  . Dyslipidemia   . Genital herpes   . GERD (gastroesophageal reflux disease)   . Hepatitis B   . HSV-2 (herpes simplex virus 2) infection 06/11/2016  . Hypothyroidism   . Hypothyroidism (acquired) 06/11/2016  . Kidney stone   . NSTEMI (non-ST elevated myocardial infarction) (HCC) 04/15/2018   Troponin 0.58 on 04/15/18  . Osteoarthritis 06/11/2016  . Osteopenia 11/30/2017  . Pancreatitis   . Peripheral vascular disease, unspecified (HCC) right subclavian artery stenosis 04/18/2019  . Prediabetes 03/11/2020  . Pseudophakia of both eyes 02/18/2018  . PVD (posterior vitreous detachment), bilateral 02/18/2018  . S/P CABG x 4 04/15/18 04/17/2018   LIMA to LAD SVG to DIAGONAL SVG to OM SVG to RCA  . Systolic murmur    Past Surgical History:  Procedure Laterality Date  . APPENDECTOMY  1972  . CORONARY ARTERY BYPASS GRAFT N/A 04/17/2018   Procedure: CORONARY ARTERY BYPASS GRAFTING (CABG) TIMES USING LEFT INTERNAL MAMMARY ARTERY TO LAD AND SAPHENOUS VEIN TO DIAG 1, OM1, AND RCA;  Surgeon: Delight Ovens, MD;  Location: [facility] OR;  Service: Open Heart Surgery;  Laterality: N/A;  . ENDOVEIN HARVEST OF GREATER SAPHENOUS VEIN Right 04/17/2018   Procedure: ENDOVEIN HARVEST OF GREATER SAPHENOUS VEIN RIGHT THIGH AND CALF;  Surgeon: Delight Ovens, MD;  Location: [facility] OR;  Service: Open Heart Surgery;  Laterality: Right;  . LAPAROSCOPY    . LEFT HEART CATH AND CORONARY ANGIOGRAPHY N/A 04/16/2018   Procedure: LEFT HEART CATH AND CORONARY ANGIOGRAPHY;  Surgeon: Kathleene Hazel, MD;  Location: [facility] INVASIVE CV LAB;  Service: Cardiovascular;  Laterality: N/A;  . OVARIAN CYST SURGERY    . TEE WITHOUT CARDIOVERSION N/A 04/17/2018   Procedure: TRANSESOPHAGEAL ECHOCARDIOGRAM (TEE);  Surgeon: Delight Ovens, MD;  Location: [facility];  Service: Open Heart Surgery;  Laterality: N/A;  . TONSILLECTOMY     Family History  Problem Relation Age of Onset   . Alcoholism Mother   . Kidney disease Mother   . Kidney Stones Mother   . Liver cancer Mother   . Alcoholism Father   . Hyperlipidemia Father   . Heart disease Father   . Heart failure Father   . Alcoholism Maternal Grandfather    Social History   Socioeconomic History  . Marital status: Divorced    Spouse name: Not on file  . Number of children: Not on file  . Years of education: Not on file  . Highest education level: Not on file  Occupational History  . Not on file  Tobacco Use  . Smoking status: Former Games developer  . Smokeless tobacco: Never Used  Vaping Use  . Vaping Use: Never used  Substance and Sexual Activity  . Alcohol use: No  . Drug use: No  . Sexual activity: Never  Other Topics Concern  . Not on file  Social History Narrative  . Not on file   Social Determinants of Health   Financial Resource Strain: Low Risk   . Difficulty of Paying Living Expenses: Not hard at all  Food Insecurity: No Food Insecurity  .  Worried About Programme researcher, broadcasting/film/video in the Last Year: Never true  . Ran Out of Food in the Last Year: Never true  Transportation Needs: No Transportation Needs  . Lack of Transportation (Medical): No  . Lack of Transportation (Non-Medical): No  Physical Activity: Insufficiently Active  . Days of Exercise per Week: 4 days  . Minutes of Exercise per Session: 30 min  Stress: No Stress Concern Present  . Feeling of Stress : Not at all  Social Connections: Socially Isolated  . Frequency of Communication with Friends and Family: More than three times a week  . Frequency of Social Gatherings with Friends and Family: More than three times a week  . Attends Religious Services: Never  . Active Member of Clubs or Organizations: No  . Attends Banker Meetings: Never  . Marital Status: Divorced    Tobacco Counseling Counseling given: Not Answered   Clinical Intake:  Pre-visit preparation completed: Yes  Pain : No/denies pain      Nutritional Status: BMI > 30  Obese Nutritional Risks: None Diabetes: No  How often do you need to have someone help you when you read instructions, pamphlets, or other written materials from your doctor or pharmacy?: 1 - Never  Diabetic?No  Interpreter Needed?: No  Information entered by :: Thomasenia Sales LPN   Activities of Daily Living In your present state of health, do you have any difficulty performing the following activities: 09/09/2020  Hearing? N  Vision? N  Difficulty concentrating or making decisions? N  Walking or climbing stairs? N  Dressing or bathing? N  Doing errands, shopping? N  Preparing Food and eating ? N  Using the Toilet? N  In the past six months, have you accidently leaked urine? Y  Comment has a pessary  Do you have problems with loss of bowel control? N  Managing your Medications? N  Managing your Finances? N  Housekeeping or managing your Housekeeping? N  Some recent data might be hidden    Patient Care Team: Copland, Gwenlyn Found, MD as PCP - General (Family Medicine) Chilton Si, MD as PCP - Cardiology (Cardiology)  Indicate any recent Medical Services you may have received from other than [facility] providers in the past year (date may be approximate).     Assessment:   This is a routine wellness examination for [place].  Hearing/Vision screen  Hearing Screening             Right ear:           Left ear:           Comments: No issues  Vision Screening Comments: Wears glasses Last eye exam-6 months ago Dr. Madilyn Fireman  Dietary issues and exercise activities discussed: Current Exercise Habits: Home exercise routine, Type of exercise: walking;strength training/weights;calisthenics, Time (Minutes): 25, Frequency (Times/Week): 5, Weekly Exercise (Minutes/Week): 125, Intensity: Mild, Exercise limited by: None identified  Goals    . Maintain healthy active lifestyle.    . Patient Stated      Increase activity      Depression Screen PHQ 2/9 Scores 09/09/2020 09/04/2019  PHQ - 2 Score 0 0    Fall Risk Fall Risk  09/09/2020 09/04/2019  Falls in the past year? 0 0  Number falls in past yr: 0 0  Injury with Fall? 0 0  Follow up Falls prevention discussed Education provided;Falls prevention discussed    FALL RISK PREVENTION PERTAINING TO THE HOME:  Any stairs in or around the home?  No  Home free of loose throw rugs in walkways, pet beds, electrical cords, etc? Yes  Adequate lighting in your home to reduce risk of falls? Yes   ASSISTIVE DEVICES UTILIZED TO PREVENT FALLS:  Life alert? No  Use of a cane, walker or w/c? No  Grab bars in the bathroom? No  Shower chair or bench in shower? No  Elevated toilet seat or a handicapped toilet? No   TIMED UP AND GO:  Was the test performed? No .phone visit   Cognitive Function:Normal cognitive status assessed by  this Nurse Health Advisor. No abnormalities found.       6CIT Screen 09/09/2020  What Year? 0 points  What month? 0 points  What time? 0 points  Count back from 20 0 points  Months in reverse 0 points  Repeat phrase 0 points  Total Score 0    Immunizations Immunization History  Administered Date(s) Administered  . Fluad Quad(high Dose 65+) 01/09/2019  . Influenza-Unspecified 01/19/2017, 01/24/2018, 02/06/2020  . PFIZER Comirnaty(Gray Top)Covid-19 Tri-Sucrose Vaccine 08/20/2020  . PFIZER(Purple Top)SARS-COV-2 Vaccination 06/20/2019, 07/11/2019, 02/17/2020  . Pneumococcal Conjugate-13 05/15/2016  . Pneumococcal Polysaccharide-23 05/15/2014  . Tdap 05/15/2013    TDAP status: Up to date  Flu Vaccine status: Up to date  Pneumococcal vaccine status: Up to date  Covid-19 vaccine status: Completed vaccines  Qualifies for Shingles Vaccine? Yes   Zostavax completed No   Shingrix Completed?: No.    Education has been provided regarding the importance of this vaccine. Patient has been advised to call insurance  company to determine out of pocket expense if they have not yet received this vaccine. Advised may also receive vaccine at local pharmacy or Health Dept. Verbalized acceptance and understanding.  Screening Tests Health Maintenance  Topic Date Due  . MAMMOGRAM  Never done  . Fecal DNA (Cologuard)  06/21/2020  . INFLUENZA VACCINE  12/13/2020  . TETANUS/TDAP  05/16/2023  . DEXA SCAN  Completed  . COVID-19 Vaccine  Completed  . Hepatitis C Screening  Completed  . PNA vac Low Risk Adult  Completed  . HPV VACCINES  Aged Out    Health Maintenance  Health Maintenance Due  Topic Date Due  . MAMMOGRAM  Never done  . Fecal DNA (Cologuard)  06/21/2020    Colorectal cancer screening: Due- Declined today.  Mammogram status:Due- Declined today.  Bone Density status:Due- Declined today.  Lung Cancer Screening: (Low Dose CT Chest recommended if Age 20-80 years, 30 pack-year currently smoking OR have quit w/in 15years.) does not qualify.    Additional Screening:  Hepatitis C Screening: Completed 02/04/2020  Vision Screening: Recommended annual ophthalmology exams for early detection of glaucoma and other disorders of the eye. Is the patient up to date with their annual eye exam?  Yes  Who is the provider or what is the name of the office in which the patient attends annual eye exams? Dr. Madilyn FiremanHayes   Dental Screening: Recommended annual dental exams for proper oral hygiene  Community Resource Referral / Chronic Care Management: CRR required this visit?  No   CCM required this visit?  No      Plan:     I have personally reviewed and noted the following in the patient's chart:   . Medical and social history . Use of alcohol, tobacco or illicit drugs  . Current medications and supplements . Functional ability and status . Nutritional status . Physical activity . Advanced directives . List of other physicians . Hospitalizations, surgeries,  and ER visits in previous 12  months . Vitals . Screenings to include cognitive, depression, and falls . Referrals and appointments  In addition, I have reviewed and discussed with patient certain preventive protocols, quality metrics, and best practice recommendations. A written personalized care plan for preventive services as well as general preventive health recommendations were provided to patient.   Due to this being a telephonic visit, the after visit summary with patients personalized plan was offered to patient via mail or my-chart.  Patient would like to access on my-chart.   Roanna Raider, LPN   12/22/9831  Nurse Health Advisor  Nurse Notes: None

## 2020-09-16 ENCOUNTER — Other Ambulatory Visit: Payer: Self-pay

## 2020-09-21 ENCOUNTER — Ambulatory Visit (INDEPENDENT_AMBULATORY_CARE_PROVIDER_SITE_OTHER): Payer: Medicare Other | Admitting: Cardiology

## 2020-09-21 ENCOUNTER — Encounter: Payer: Self-pay | Admitting: Cardiology

## 2020-09-21 ENCOUNTER — Other Ambulatory Visit: Payer: Self-pay

## 2020-09-21 VITALS — BP 138/70 | HR 67 | Ht 61.0 in | Wt 164.0 lb

## 2020-09-21 DIAGNOSIS — Z951 Presence of aortocoronary bypass graft: Secondary | ICD-10-CM | POA: Diagnosis not present

## 2020-09-21 DIAGNOSIS — I739 Peripheral vascular disease, unspecified: Secondary | ICD-10-CM | POA: Diagnosis not present

## 2020-09-21 DIAGNOSIS — R7303 Prediabetes: Secondary | ICD-10-CM | POA: Diagnosis not present

## 2020-09-21 DIAGNOSIS — E785 Hyperlipidemia, unspecified: Secondary | ICD-10-CM | POA: Diagnosis not present

## 2020-09-21 NOTE — Patient Instructions (Signed)
Medication Instructions:  Your physician recommends that you continue on your current medications as directed. Please refer to the Current Medication list given to you today.  *If you need a refill on your cardiac medications before your next appointment, please call your pharmacy*   Lab Work: Your physician recommends that you return for lab work when fasting: lipid, lft  If you have labs (blood work) drawn today and your tests are completely normal, you will receive your results only by: Marland Kitchen MyChart Message (if you have MyChart) OR . A paper copy in the mail If you have any lab test that is abnormal or we need to change your treatment, we will call you to review the results.   Testing/Procedures: None   Follow-Up: At [facility], you and your health needs are our priority.  As part of our continuing mission to provide you with exceptional heart care, we have created designated Provider Care Teams.  These Care Teams include your primary Cardiologist (physician) and Advanced Practice Providers (APPs -  Physician Assistants and Nurse Practitioners) who all work together to provide you with the care you need, when you need it.  We recommend signing up for the patient portal called "MyChart".  Sign up information is provided on this After Visit Summary.  MyChart is used to connect with patients for Virtual Visits (Telemedicine).  Patients are able to view lab/test results, encounter notes, upcoming appointments, etc.  Non-urgent messages can be sent to your provider as well.   To learn more about what you can do with MyChart, go to [email or web address].    Your next appointment:   1 year(s)  The format for your next appointment:   In Person  Provider:   Gypsy Balsam, MD   Other Instructions

## 2020-09-21 NOTE — Progress Notes (Signed)
Cardiology Office Note:    Date:  09/21/2020   ID:  [name], DOB 02/28/1951, MRN [id number]  PCP:  Pearline Cables, MD  Cardiologist:  Gypsy Balsam, MD    Referring MD: Pearline Cables, MD   No chief complaint on file. I am doing fine  History of Present Illness:    [name] is a 70 y.o. female with past medical history significant for coronary artery disease, she did have coronary artery bypass graft done in 2019, preserved left ventricular ejection fraction, peripheral vascular disease in form of carotic arterial stenosis without critical lesions.  She comes today 2 months of follow-up overall she is doing well she is busy taking care of some friend, she walks up and down stairs with no difficulties.  She also like to go outside and feet Barrett's she has no difficulty doing it overall she seems to be very happy.  Past Medical History:  Diagnosis Date  . Arthritis   . Bilateral carotid bruits    1-39% bilateral ICA stenosis  . Branch retinal vein occlusion of left eye with macular edema 01/09/2018  . Chicken pox   . Closed fracture of left wrist 12/13/2018   Formatting of this note might be different from the original. Added automatically from request for surgery [id number]  . Diverticulosis of colon 05/16/2015  . Dyslipidemia   . Genital herpes   . GERD (gastroesophageal reflux disease)   . Hepatitis B   . HSV-2 (herpes simplex virus 2) infection 06/11/2016  . Hypothyroidism   . Hypothyroidism (acquired) 06/11/2016  . Kidney stone   . NSTEMI (non-ST elevated myocardial infarction) (HCC) 04/15/2018   Troponin 0.58 on 04/15/18  . Osteoarthritis 06/11/2016  . Osteopenia 11/30/2017  . Pancreatitis   . Peripheral vascular disease, unspecified (HCC) right subclavian artery stenosis 04/18/2019  . Prediabetes 03/11/2020  . Pseudophakia of both eyes 02/18/2018  . PVD (posterior vitreous detachment), bilateral 02/18/2018  . S/P CABG x 4 04/15/18 04/17/2018   LIMA to LAD  SVG to DIAGONAL SVG to OM SVG to RCA  . Systolic murmur     Past Surgical History:  Procedure Laterality Date  . APPENDECTOMY  1972  . CORONARY ARTERY BYPASS GRAFT N/A 04/17/2018   Procedure: CORONARY ARTERY BYPASS GRAFTING (CABG) TIMES USING LEFT INTERNAL MAMMARY ARTERY TO LAD AND SAPHENOUS VEIN TO DIAG 1, OM1, AND RCA;  Surgeon: Delight Ovens, MD;  Location: [facility] OR;  Service: Open Heart Surgery;  Laterality: N/A;  . ENDOVEIN HARVEST OF GREATER SAPHENOUS VEIN Right 04/17/2018   Procedure: ENDOVEIN HARVEST OF GREATER SAPHENOUS VEIN RIGHT THIGH AND CALF;  Surgeon: Delight Ovens, MD;  Location: [facility] OR;  Service: Open Heart Surgery;  Laterality: Right;  . LAPAROSCOPY    . LEFT HEART CATH AND CORONARY ANGIOGRAPHY N/A 04/16/2018   Procedure: LEFT HEART CATH AND CORONARY ANGIOGRAPHY;  Surgeon: Kathleene Hazel, MD;  Location: [facility] INVASIVE CV LAB;  Service: Cardiovascular;  Laterality: N/A;  . OVARIAN CYST SURGERY    . TEE WITHOUT CARDIOVERSION N/A 04/17/2018   Procedure: TRANSESOPHAGEAL ECHOCARDIOGRAM (TEE);  Surgeon: Delight Ovens, MD;  Location: [facility] OR;  Service: Open Heart Surgery;  Laterality: N/A;  . TONSILLECTOMY      Current Medications: Current Meds  Medication Sig  . acetaminophen (TYLENOL) 500 MG tablet Take 500 mg by mouth every 6 (six) hours as needed.  Marland Kitchen acyclovir (ZOVIRAX) 400 MG tablet Take 1 tablet (400 mg total) by mouth 2 (two) times daily.  Marland Kitchen  aspirin EC 81 MG EC tablet Take 1 tablet (81 mg total) by mouth daily.  Marland Kitchen atorvastatin (LIPITOR) 80 MG tablet Take 1 tablet (80 mg total) by mouth daily.  . cholecalciferol (VITAMIN D3) 25 MCG (1000 UNIT) tablet Take 2,000 Units by mouth daily.  . clobetasol cream (TEMOVATE) 0.05 % Apply 1 application topically 2 (two) times daily. Apply to affected area twice a day  . ezetimibe (ZETIA) 10 MG tablet Take 1 tablet (10 mg total) by mouth daily.  Marland Kitchen levothyroxine (SYNTHROID) 88 MCG tablet Take 1 tablet (88 mcg total) by mouth  daily before breakfast.  . metoprolol tartrate (LOPRESSOR) 25 MG tablet Take 0.5 tablets (12.5 mg total) by mouth 2 (two) times daily.  Marland Kitchen omeprazole (PRILOSEC) 40 MG capsule Take 1 capsule (40 mg total) by mouth daily.     Allergies:   Dye fdc blue  [brilliant blue fcf], Contrast media [iodinated diagnostic agents], and Scopolamine   Social History   Socioeconomic History  . Marital status: Divorced    Spouse name: Not on file  . Number of children: Not on file  . Years of education: Not on file  . Highest education level: Not on file  Occupational History  . Not on file  Tobacco Use  . Smoking status: Former Games developer  . Smokeless tobacco: Never Used  Vaping Use  . Vaping Use: Never used  Substance and Sexual Activity  . Alcohol use: No  . Drug use: No  . Sexual activity: Never  Other Topics Concern  . Not on file  Social History Narrative  . Not on file   Social Determinants of Health   Financial Resource Strain: Low Risk   . Difficulty of Paying Living Expenses: Not hard at all  Food Insecurity: No Food Insecurity  . Worried About Programme researcher, broadcasting/film/video in the Last Year: Never true  . Ran Out of Food in the Last Year: Never true  Transportation Needs: No Transportation Needs  . Lack of Transportation (Medical): No  . Lack of Transportation (Non-Medical): No  Physical Activity: Insufficiently Active  . Days of Exercise per Week: 4 days  . Minutes of Exercise per Session: 30 min  Stress: No Stress Concern Present  . Feeling of Stress : Not at all  Social Connections: Socially Isolated  . Frequency of Communication with Friends and Family: More than three times a week  . Frequency of Social Gatherings with Friends and Family: More than three times a week  . Attends Religious Services: Never  . Active Member of Clubs or Organizations: No  . Attends Banker Meetings: Never  . Marital Status: Divorced     Family History: The patient's family history  includes Alcoholism in her father, maternal grandfather, and mother; Heart disease in her father; Heart failure in her father; Hyperlipidemia in her father; Kidney Stones in her mother; Kidney disease in her mother; Liver cancer in her mother. ROS:   Please see the history of present illness.    All 14 point review of systems negative except as described per history of present illness  EKGs/Labs/Other Studies Reviewed:      Recent Labs: 02/04/2020: ALT 17; BUN 9; Creat 0.77; Hemoglobin 14.3; Platelets 271; Potassium 4.0; Sodium 135 03/10/2020: TSH 0.70  Recent Lipid Panel    Component Value Date/Time   CHOL 143 09/22/2019 1130   TRIG 87 09/22/2019 1130   HDL 57 09/22/2019 1130   CHOLHDL 2.5 09/22/2019 1130   CHOLHDL 3.0 04/16/2018  0229   VLDL 14 04/16/2018 0229   LDLCALC 70 09/22/2019 1130    Physical Exam:    VS:  BP 138/70 (BP Location: Left Arm, Patient Position: Sitting)   Pulse 67   Ht 5\' 1"  (1.549 m)   Wt 164 lb (74.4 kg)   BMI 30.99 kg/m     Wt Readings from Last 3 Encounters:  09/21/20 164 lb (74.4 kg)  09/09/20 163 lb (73.9 kg)  07/05/20 163 lb 1.3 oz (74 kg)     GEN:  Well nourished, well developed in no acute distress HEENT: Normal NECK: No JVD; No carotid bruits LYMPHATICS: No lymphadenopathy CARDIAC: RRR,  holosystolic murmur grade 2/6 to 3/6 best heard left border of the sternum and apexs, no rubs, no gallops RESPIRATORY:  Clear to auscultation without rales, wheezing or rhonchi  ABDOMEN: Soft, non-tender, non-distended MUSCULOSKELETAL:  No edema; No deformity  SKIN: Warm and dry LOWER EXTREMITIES: no swelling NEUROLOGIC:  Alert and oriented x 3 PSYCHIATRIC:  Normal affect   ASSESSMENT:    1. S/P CABG x 4 04/15/18   2. Peripheral vascular disease, unspecified (HCC) right subclavian artery stenosis   3. Dyslipidemia   4. Prediabetes    PLAN:    In order of problems listed above:  1. Status post coronary bypass graft stable from that point  review, she is on antiplatelet therapy as well as high intense statin which I will continue. 2. Peripheral arterial disease no issues.  We will continue monitoring 3. When I see her we will do another course of ultrasound. 4. Mitral regurgitation which is mild to moderate based on echocardiogram.  We will continue present management for likely she is asymptomatic. 5. Dyslipidemia I did review her K PN which were done exactly a year ago with LDL of 70 HDL 57 we will do another cholesterol profile tomorrow when she will come back to our office fasting.   Medication Adjustments/Labs and Tests Ordered: Current medicines are reviewed at length with the patient today.  Concerns regarding medicines are outlined above.  No orders of the defined types were placed in this encounter.  Medication changes: No orders of the defined types were placed in this encounter.   Signed, 14/02/19, MD, [facility] 09/21/2020 11:16 AM    [facility]

## 2020-09-22 DIAGNOSIS — R7303 Prediabetes: Secondary | ICD-10-CM | POA: Diagnosis not present

## 2020-09-22 DIAGNOSIS — I739 Peripheral vascular disease, unspecified: Secondary | ICD-10-CM | POA: Diagnosis not present

## 2020-09-22 DIAGNOSIS — Z951 Presence of aortocoronary bypass graft: Secondary | ICD-10-CM | POA: Diagnosis not present

## 2020-09-22 DIAGNOSIS — E785 Hyperlipidemia, unspecified: Secondary | ICD-10-CM | POA: Diagnosis not present

## 2020-09-23 LAB — LIPID PANEL
Chol/HDL Ratio: 2.5 ratio (ref 0.0–4.4)
Cholesterol, Total: 157 mg/dL (ref 100–199)
HDL: 62 mg/dL (ref >39)
LDL Chol Calc (NIH): 78 mg/dL (ref 0–99)
Triglycerides: 89 mg/dL (ref 0–149)
VLDL Cholesterol Cal: 17 mg/dL (ref 5–40)

## 2020-09-23 LAB — HEPATIC FUNCTION PANEL
ALT: 21 IU/L (ref 0–32)
AST: 23 IU/L (ref 0–40)
Albumin: 4.3 g/dL (ref 3.7–4.7)
Alkaline Phosphatase: 90 IU/L (ref 44–121)
Bilirubin Total: 0.4 mg/dL (ref 0.0–1.2)
Bilirubin, Direct: 0.12 mg/dL (ref 0.00–0.40)
Total Protein: 6.5 g/dL (ref 6.0–8.5)

## 2020-10-20 DIAGNOSIS — H43813 Vitreous degeneration, bilateral: Secondary | ICD-10-CM | POA: Diagnosis not present

## 2020-10-20 DIAGNOSIS — Z961 Presence of intraocular lens: Secondary | ICD-10-CM | POA: Diagnosis not present

## 2020-10-20 DIAGNOSIS — H34832 Tributary (branch) retinal vein occlusion, left eye, with macular edema: Secondary | ICD-10-CM | POA: Diagnosis not present

## 2020-11-09 ENCOUNTER — Other Ambulatory Visit: Payer: Self-pay | Admitting: Physician Assistant

## 2020-12-08 ENCOUNTER — Encounter: Payer: Self-pay | Admitting: Obstetrics & Gynecology

## 2020-12-08 ENCOUNTER — Ambulatory Visit (INDEPENDENT_AMBULATORY_CARE_PROVIDER_SITE_OTHER): Payer: Medicare Other | Admitting: Obstetrics & Gynecology

## 2020-12-08 ENCOUNTER — Other Ambulatory Visit: Payer: Self-pay

## 2020-12-08 VITALS — BP 137/78 | HR 57 | Wt 163.0 lb

## 2020-12-08 DIAGNOSIS — N905 Atrophy of vulva: Secondary | ICD-10-CM | POA: Diagnosis not present

## 2020-12-08 DIAGNOSIS — N9089 Other specified noninflammatory disorders of vulva and perineum: Secondary | ICD-10-CM | POA: Diagnosis not present

## 2020-12-08 DIAGNOSIS — N819 Female genital prolapse, unspecified: Secondary | ICD-10-CM | POA: Diagnosis not present

## 2020-12-08 NOTE — Progress Notes (Signed)
History:  70 y.o. G2P1011 here today for f/u of pelvic organ prolapse. Pt also reports continued irritation and pain of vulva. She has taken the pessary out due to discomfort. She reports that she pushes to have a stool, the pessary comes out. At present, the pessary is out.      The following portions of the patient's history were reviewed and updated as appropriate: allergies, current medications, past family history, past medical history, past social history, past surgical history and problem list.  Review of Systems:  Pertinent items are noted in HPI.    Objective:  Physical Exam Blood pressure 137/78, pulse (!) 57, weight 163 lb (73.9 kg).  CONSTITUTIONAL: Well-developed, well-nourished female in no acute distress.  HENT:  Normocephalic, atraumatic EYES: Conjunctivae and EOM are normal. No scleral icterus.  NECK: Normal range of motion SKIN: Skin is warm and dry. No rash noted. Not diaphoretic.No pallor. NEUROLGIC: Alert and oriented to person, place, and time. Normal coordination.   Pelvic: Normal appearing external genitalia; atrophic appearing vaginal mucosa. There are not lesions. There is some leukoplakia on the labia minora and near the introitus. Normal discharge.    Assessment & Plan:  [name] was seen today for follow-up.  Diagnoses and all orders for this visit:  Female genital prolapse, unspecified type  Vulvar irritation  Vulvar atrophy   Pt will use the topical EES at the introitus. I have pointed out the region where this will be used.  Rec coconut oil prn on the labia majus and the mons pubis.   Pt does not want to be resized for a larger pessary.  She will f/u based on her sx. If her sx resolve she will not f/u until her annual  Pessary replaced in office today.   Total face-to-face time with patient, review of chart, discussion with consultant and coordination of care was .    [name], M.D., [facility]

## 2020-12-10 ENCOUNTER — Encounter: Payer: Self-pay | Admitting: Obstetrics & Gynecology

## 2021-01-05 ENCOUNTER — Other Ambulatory Visit: Payer: Self-pay

## 2021-01-05 ENCOUNTER — Encounter: Payer: Self-pay | Admitting: Family Medicine

## 2021-01-05 ENCOUNTER — Ambulatory Visit (INDEPENDENT_AMBULATORY_CARE_PROVIDER_SITE_OTHER): Payer: Medicare Other | Admitting: Family Medicine

## 2021-01-05 VITALS — BP 110/72 | HR 82 | Temp 98.5°F | Resp 18 | Ht 61.0 in | Wt 163.0 lb

## 2021-01-05 DIAGNOSIS — R300 Dysuria: Secondary | ICD-10-CM

## 2021-01-05 LAB — POCT URINALYSIS DIP (MANUAL ENTRY)
Bilirubin, UA: NEGATIVE
Glucose, UA: NEGATIVE mg/dL
Ketones, POC UA: NEGATIVE mg/dL
Leukocytes, UA: NEGATIVE
Nitrite, UA: NEGATIVE
Protein Ur, POC: NEGATIVE mg/dL
Spec Grav, UA: <=1.005 — AB (ref 1.010–1.025)
Urobilinogen, UA: 0.2 E.U./dL
pH, UA: 6 (ref 5.0–8.0)

## 2021-01-05 MED ORDER — CIPROFLOXACIN HCL 500 MG PO TABS: ORAL_TABLET | ORAL | 0 refills | Status: DC

## 2021-01-05 NOTE — Telephone Encounter (Signed)
Ok to place lab order for urine drop off?   Or is it best patient be seen at urgent care?

## 2021-01-05 NOTE — Patient Instructions (Signed)
Good to see you today, I will be in touch with your urine culture asap In the meantime use cipro twice a day- take for 5-7 days depending on your symptoms If you feel like you are getting worse please contact me asap or otherwise seek help

## 2021-01-05 NOTE — Progress Notes (Addendum)
Healthcare at [facility] [address] [place], [place] [address] [phone]  Date:  01/05/2021   Name:  [name]   DOB:  Nov 22, 1950   MRN:  [id number]  PCP:  Pearline Cables, MD    Chief Complaint: Dysuria (Urinary frequency, urgency, fever, aches)   History of Present Illness:  [name] is a 70 y.o. very pleasant female patient who presents with the following:  Patient seen today with concern of UTI.  She contacted me earlier today with urinary tract infection symptoms, also fever and achiness so we made her an urgent appointment  Last visit with myself was in October- history of NSTEMI and CABG in 2019, hypothyroidism, dyslipidemia, GERD, borderline prediabetes  She noted rather sudden onset of symptoms of chills, fever to 99.4 since yesterday afternoon She also notes mild urinary burning and frequency No gross hematuria No back pain   She notes history of microhematuria which has been evaluated and found to be benign.  She also notes some history of kidney stones.  Patient Active Problem List   Diagnosis Date Noted   Arthritis    Chicken pox    Genital herpes    Hepatitis B    Hypothyroidism    Kidney stone    Pancreatitis    Prediabetes 03/11/2020   Peripheral vascular disease, unspecified (HCC) right subclavian artery stenosis 04/18/2019   Closed fracture of left wrist 12/13/2018   S/P CABG x 4 04/15/18 04/17/2018   NSTEMI (non-ST elevated myocardial infarction) (HCC) 04/15/2018   Bilateral carotid bruits    Systolic murmur    Pseudophakia of both eyes 02/18/2018   PVD (posterior vitreous detachment), bilateral 02/18/2018   Branch retinal vein occlusion of left eye with macular edema 01/09/2018   Osteopenia 11/30/2017   GERD (gastroesophageal reflux disease) 06/11/2016   Osteoarthritis 06/11/2016   Hypothyroidism (acquired) 06/11/2016   Dyslipidemia 06/11/2016   HSV-2 (herpes simplex virus 2)  infection 06/11/2016   Diverticulosis of colon 05/16/2015    Past Medical History:  Diagnosis Date   Arthritis    Bilateral carotid bruits    1-39% bilateral ICA stenosis   Branch retinal vein occlusion of left eye with macular edema 01/09/2018   Chicken pox    Closed fracture of left wrist 12/13/2018   Formatting of this note might be different from the original. Added automatically from request for surgery [id number]   Diverticulosis of colon 05/16/2015   Dyslipidemia    Genital herpes    GERD (gastroesophageal reflux disease)    Hepatitis B    HSV-2 (herpes simplex virus 2) infection 06/11/2016   Hypothyroidism    Hypothyroidism (acquired) 06/11/2016   Kidney stone    NSTEMI (non-ST elevated myocardial infarction) (HCC) 04/15/2018   Troponin 0.58 on 04/15/18   Osteoarthritis 06/11/2016   Osteopenia 11/30/2017   Pancreatitis    Peripheral vascular disease, unspecified (HCC) right subclavian artery stenosis 04/18/2019   Prediabetes 03/11/2020   Pseudophakia of both eyes 02/18/2018   PVD (posterior vitreous detachment), bilateral 02/18/2018   S/P CABG x 4 04/15/18 04/17/2018   LIMA to LAD SVG to DIAGONAL SVG to OM SVG to RCA   Systolic murmur     Past Surgical History:  Procedure Laterality Date   APPENDECTOMY  1972   CORONARY ARTERY BYPASS GRAFT N/A 04/17/2018   Procedure: CORONARY ARTERY BYPASS GRAFTING (CABG) TIMES USING LEFT INTERNAL MAMMARY ARTERY TO LAD AND SAPHENOUS VEIN TO DIAG 1, OM1,  AND RCA;  Surgeon: Delight Ovens, MD;  Location: [facility] OR;  Service: Open Heart Surgery;  Laterality: N/A;   ENDOVEIN HARVEST OF GREATER SAPHENOUS VEIN Right 04/17/2018   Procedure: ENDOVEIN HARVEST OF GREATER SAPHENOUS VEIN RIGHT THIGH AND CALF;  Surgeon: Delight Ovens, MD;  Location: [facility] OR;  Service: Open Heart Surgery;  Laterality: Right;   LAPAROSCOPY     LEFT HEART CATH AND CORONARY ANGIOGRAPHY N/A 04/16/2018   Procedure: LEFT HEART CATH AND CORONARY ANGIOGRAPHY;  Surgeon: Kathleene Hazel, MD;  Location: [facility] INVASIVE CV LAB;  Service: Cardiovascular;  Laterality: N/A;   OVARIAN CYST SURGERY     TEE WITHOUT CARDIOVERSION N/A 04/17/2018   Procedure: TRANSESOPHAGEAL ECHOCARDIOGRAM (TEE);  Surgeon: Delight Ovens, MD;  Location: [facility] OR;  Service: Open Heart Surgery;  Laterality: N/A;   TONSILLECTOMY      Social History   Tobacco Use   Smoking status: Former   Smokeless tobacco: Never  Building services engineer Use: Never used  Substance Use Topics   Alcohol use: No   Drug use: No    Family History  Problem Relation Age of Onset   Alcoholism Mother    Kidney disease Mother    Kidney Stones Mother    Liver cancer Mother    Alcoholism Father    Hyperlipidemia Father    Heart disease Father    Heart failure Father    Alcoholism Maternal Grandfather     Allergies  Allergen Reactions   Dye Fdc Blue  [Fd&C Blue #1 (Brilliant Blue Fcf)] Anaphylaxis    Dye used for CAT scans   Contrast Media [Iodinated Diagnostic Agents]    Scopolamine Anxiety and Other (See Comments)    Medication list has been reviewed and updated.  Current Outpatient Medications on File Prior to Visit  Medication Sig Dispense Refill   acetaminophen (TYLENOL) 500 MG tablet Take 500 mg by mouth every 6 (six) hours as needed.     acyclovir (ZOVIRAX) 400 MG tablet Take 1 tablet (400 mg total) by mouth 2 (two) times daily. 180 tablet 3   aspirin EC 81 MG EC tablet Take 1 tablet (81 mg total) by mouth daily.     atorvastatin (LIPITOR) 80 MG tablet Take 1 tablet (80 mg total) by mouth daily. 90 tablet 3   cholecalciferol (VITAMIN D3) 25 MCG (1000 UNIT) tablet Take 2,000 Units by mouth daily.     clobetasol cream (TEMOVATE) 0.05 % Apply 1 application topically 2 (two) times daily. Apply to affected area twice a day 30 g 2   ezetimibe (ZETIA) 10 MG tablet Take 1 tablet (10 mg total) by mouth daily. 90 tablet 3   levothyroxine (SYNTHROID) 88 MCG tablet Take 1 tablet (88 mcg total) by mouth  daily before breakfast. 90 tablet 1   metoprolol tartrate (LOPRESSOR) 25 MG tablet Take 1/2 (one-half) tablet by mouth twice daily 90 tablet 0   omeprazole (PRILOSEC) 40 MG capsule Take 1 capsule (40 mg total) by mouth daily. 90 capsule 3   No current facility-administered medications on file prior to visit.    Review of Systems:  As per HPI- otherwise negative.   Physical Examination: Vitals:   01/05/21 1501  BP: 110/72  Pulse: 82  Resp: 18  Temp: 98.5 F (36.9 C)  SpO2: 98%   Vitals:   01/05/21 1501  Weight: 163 lb (73.9 kg)  Height: 5\' 1"  (1.549 m)   Body mass index is 30.8 kg/m. Ideal Body  Weight: Weight in (lb) to have BMI = 25: 132  GEN: no acute distress. HEENT: Atraumatic, Normocephalic.  Ears and Nose: No external deformity. CV: RRR, No M/G/R. No JVD. No thrill. No extra heart sounds. PULM: CTA B, no wheezes, crackles, rhonchi. No retractions. No resp. distress. No accessory muscle use. ABD: S, NT, ND. No rebound. No HSM. EXTR: No c/c/e PSYCH: Normally interactive. Conversant.  Obese, otherwise looks well.  Belly is benign, no CVA tenderness Results for orders placed or performed in visit on 01/05/21  POCT urinalysis dipstick  Result Value Ref Range   Color, UA yellow yellow   Clarity, UA cloudy (A) clear   Glucose, UA negative negative mg/dL   Bilirubin, UA negative negative   Ketones, POC UA negative negative mg/dL   Spec Grav, UA <=3.235 (A) 1.010 - 1.025   Blood, UA trace-intact (A) negative   pH, UA 6.0 5.0 - 8.0   Protein Ur, POC negative negative mg/dL   Urobilinogen, UA 0.2 0.2 or 1.0 E.U./dL   Nitrite, UA Negative Negative   Leukocytes, UA Negative Negative    Assessment and Plan: Dysuria - Plan: Urine Culture, POCT urinalysis dipstick, ciprofloxacin (CIPRO) 500 MG tablet  Patient seen today with concern of UTI Given sx of low grade fever and chills, we will treat her with ciprofloxacin in case she is developing pyelonephritis.  She has  used this medication before successfully, we did discuss some increased risk of tendon rupture with quinolones.  She feels comfortable using this medication  She will follow-up if not improving within 24 to 48 hours, sooner if worse  Will plan further follow- up pending labs. This visit occurred during the SARS-CoV-2 public health emergency.  Safety protocols were in place, including screening questions prior to the visit, additional usage of staff PPE, and extensive cleaning of exam room while observing appropriate contact time as indicated for disinfecting solutions.    Signed Abbe Amsterdam, MD  Received her urine culture as below 8/26- message to pt Results for orders placed or performed in visit on 01/05/21  Urine Culture   Specimen: Urine  Result Value Ref Range   MICRO NUMBER: [id number]    SPECIMEN QUALITY: Adequate    Sample Source NOT GIVEN    STATUS: FINAL    Result: No Growth   POCT urinalysis dipstick  Result Value Ref Range   Color, UA yellow yellow   Clarity, UA cloudy (A) clear   Glucose, UA negative negative mg/dL   Bilirubin, UA negative negative   Ketones, POC UA negative negative mg/dL   Spec Grav, UA <=4.270 (A) 1.010 - 1.025   Blood, UA trace-intact (A) negative   pH, UA 6.0 5.0 - 8.0   Protein Ur, POC negative negative mg/dL   Urobilinogen, UA 0.2 0.2 or 1.0 E.U./dL   Nitrite, UA Negative Negative   Leukocytes, UA Negative Negative

## 2021-01-06 LAB — URINE CULTURE
MICRO NUMBER:: 12285509
Result:: NO GROWTH
SPECIMEN QUALITY:: ADEQUATE

## 2021-01-07 ENCOUNTER — Encounter: Payer: Self-pay | Admitting: Family Medicine

## 2021-01-16 DIAGNOSIS — R21 Rash and other nonspecific skin eruption: Secondary | ICD-10-CM | POA: Diagnosis not present

## 2021-01-25 ENCOUNTER — Encounter: Payer: Self-pay | Admitting: Family Medicine

## 2021-01-31 ENCOUNTER — Ambulatory Visit (INDEPENDENT_AMBULATORY_CARE_PROVIDER_SITE_OTHER): Payer: Medicare Other | Admitting: Obstetrics & Gynecology

## 2021-01-31 ENCOUNTER — Other Ambulatory Visit: Payer: Self-pay

## 2021-01-31 ENCOUNTER — Encounter: Payer: Self-pay | Admitting: Obstetrics & Gynecology

## 2021-01-31 VITALS — BP 100/59 | HR 57 | Wt 161.0 lb

## 2021-01-31 DIAGNOSIS — R102 Pelvic and perineal pain: Secondary | ICD-10-CM | POA: Diagnosis not present

## 2021-01-31 DIAGNOSIS — N958 Other specified menopausal and perimenopausal disorders: Secondary | ICD-10-CM

## 2021-01-31 DIAGNOSIS — L280 Lichen simplex chronicus: Secondary | ICD-10-CM | POA: Diagnosis not present

## 2021-01-31 MED ORDER — CLOBETASOL PROPIONATE 0.05 % EX CREA: 1.0000 "application " | TOPICAL_CREAM | Freq: Two times a day (BID) | CUTANEOUS | 2 refills | Status: DC

## 2021-01-31 NOTE — Progress Notes (Signed)
History:  70 y.o. G2P1011 here today for f/u of vulvar atrophy.  She reports that her sx are not improving she is not having itching but, she still sees white patches. She has stopped taking out her pessary regularaly due to pain and bleeding when she takes it out. She does not want it moved today because it is uncomfrtbale when it is re oved and replaced. She denies bleeding outside of the pessary changes. She reports that the coconut oil has been so helpful in managing her sx.      The following portions of the patient's history were reviewed and updated as appropriate: allergies, current medications, past family history, past medical history, past social history, past surgical history and problem list.  Review of Systems:  Pertinent items are noted in HPI.    Objective:  Physical Exam Blood pressure (!) 100/59, pulse (!) 57, weight 161 lb (73 kg).  CONSTITUTIONAL: Well-developed, well-nourished female in no acute distress.  HENT:  Normocephalic, atraumatic EYES: Conjunctivae and EOM are normal. No scleral icterus.  NECK: Normal range of motion SKIN: Skin is warm and dry. No rash noted. Not diaphoretic.No pallor. NEUROLGIC: Alert and oriented to person, place, and time. Normal coordination.  Pelvic: Normal appearing external genitalia with atrophy. Pt declined removal of pessary here in ofc.    Assessment & Plan:  [name] was seen today for follow-up.  Diagnoses and all orders for this visit:  Genitourinary syndrome of menopause -     clobetasol cream (TEMOVATE) 0.05 %; Apply 1 application topically 2 (two) times daily. Apply to affected area twice a day  Vulvar pain -     clobetasol cream (TEMOVATE) 0.05 %; Apply 1 application topically 2 (two) times daily. Apply to affected area twice a day  Lichenoid dermatitis -     clobetasol cream (TEMOVATE) 0.05 %; Apply 1 application topically 2 (two) times daily. Apply to affected area twice a day   Pt will remove and clean Pessary at  home Keep Clobetasol prn.  Pt will stop looking for color changes as that is bringing her frustration.  F/u in 6 months or sooner prn  Pt will reconsider EES cream. It is costly on a reg bases.  [name], M.D., [facility]

## 2021-02-01 ENCOUNTER — Other Ambulatory Visit (HOSPITAL_BASED_OUTPATIENT_CLINIC_OR_DEPARTMENT_OTHER): Payer: Self-pay

## 2021-02-01 MED ORDER — INFLUENZA VAC A&B SA ADJ QUAD 0.5 ML IM PRSY
PREFILLED_SYRINGE | INTRAMUSCULAR | 0 refills | Status: DC
Start: 2021-02-01 — End: 2021-09-08
  Filled 2021-02-01: qty 0.5, 1d supply, fill #0

## 2021-02-03 ENCOUNTER — Telehealth: Payer: Self-pay | Admitting: Cardiology

## 2021-02-03 MED ORDER — METOPROLOL TARTRATE 25 MG PO TABS: ORAL_TABLET | ORAL | 3 refills | Status: DC

## 2021-02-03 NOTE — Telephone Encounter (Signed)
*  STAT* If patient is at the pharmacy, call can be transferred to refill team.   1. Which medications need to be refilled? (please list name of each medication and dose if known)  metoprolol tartrate (LOPRESSOR) 25 MG tablet  2. Which pharmacy/location (including street and city if local pharmacy) is medication to be sent to?  [facility] - [place], [place] - [address]  3. Do they need a 30 day or 90 day supply?  90 day supply

## 2021-02-03 NOTE — Telephone Encounter (Signed)
Refill sent in per request.  

## 2021-02-10 ENCOUNTER — Encounter: Payer: Self-pay | Admitting: Obstetrics & Gynecology

## 2021-03-04 ENCOUNTER — Ambulatory Visit: Payer: Medicare Other

## 2021-03-04 NOTE — Progress Notes (Signed)
   Covid-19 Vaccination Clinic  Name:  Carreen Milius    MRN: [id number] DOB: Feb 25, 1951  03/04/2021  Ms. Kulinski was observed post Covid-19 immunization for 15 minutes without incident. She was provided with Vaccine Information Sheet and instruction to access the V-Safe system.   Ms. Bryden was instructed to call 911 with any severe reactions post vaccine: Difficulty breathing  Swelling of face and throat  A fast heartbeat  A bad rash all over body  Dizziness and weakness

## 2021-03-09 ENCOUNTER — Other Ambulatory Visit: Payer: Self-pay | Admitting: Family Medicine

## 2021-03-09 DIAGNOSIS — E034 Atrophy of thyroid (acquired): Secondary | ICD-10-CM

## 2021-03-13 ENCOUNTER — Encounter: Payer: Self-pay | Admitting: Family Medicine

## 2021-03-13 DIAGNOSIS — E034 Atrophy of thyroid (acquired): Secondary | ICD-10-CM

## 2021-03-13 DIAGNOSIS — R7303 Prediabetes: Secondary | ICD-10-CM

## 2021-03-14 ENCOUNTER — Telehealth: Payer: Self-pay | Admitting: Family Medicine

## 2021-03-14 NOTE — Telephone Encounter (Signed)
Pharmacy called to state they stopped carrying the manufacturer for  levothyroxine (SYNTHROID) 88 MCG tablet  and needed to know if it was okay to switch, please advise.    [facility] [address] [place], [place] - [address]  [address] [place] [address]  Phone:  [phone]  Fax:  [phone]

## 2021-03-14 NOTE — Telephone Encounter (Signed)
Waiting for pt to have labs drawn. Before filling more Rx.

## 2021-03-15 NOTE — Telephone Encounter (Signed)
Authorization given

## 2021-03-16 ENCOUNTER — Other Ambulatory Visit (INDEPENDENT_AMBULATORY_CARE_PROVIDER_SITE_OTHER): Payer: Medicare Other

## 2021-03-16 ENCOUNTER — Other Ambulatory Visit: Payer: Self-pay

## 2021-03-16 DIAGNOSIS — R7303 Prediabetes: Secondary | ICD-10-CM

## 2021-03-16 DIAGNOSIS — E034 Atrophy of thyroid (acquired): Secondary | ICD-10-CM | POA: Diagnosis not present

## 2021-03-16 LAB — BASIC METABOLIC PANEL
BUN: 9 mg/dL (ref 6–23)
CO2: 28 mEq/L (ref 19–32)
Calcium: 9.2 mg/dL (ref 8.4–10.5)
Chloride: 97 mEq/L (ref 96–112)
Creatinine, Ser: 0.66 mg/dL (ref 0.40–1.20)
GFR: 87.56 mL/min (ref >60.00)
Glucose, Bld: 82 mg/dL (ref 70–99)
Potassium: 4.3 mEq/L (ref 3.5–5.1)
Sodium: 133 mEq/L — ABNORMAL LOW (ref 135–145)

## 2021-03-16 LAB — TSH: TSH: 0.32 u[IU]/mL — ABNORMAL LOW (ref 0.35–5.50)

## 2021-03-16 LAB — HEMOGLOBIN A1C: Hgb A1c MFr Bld: 5.8 % (ref 4.6–6.5)

## 2021-03-17 ENCOUNTER — Encounter: Payer: Self-pay | Admitting: Family Medicine

## 2021-03-17 DIAGNOSIS — E034 Atrophy of thyroid (acquired): Secondary | ICD-10-CM

## 2021-03-19 IMAGING — US US PELVIS COMPLETE WITH TRANSVAGINAL
1 series · 13 of 25 positions shown · non-contrast
Comparison: None

CLINICAL DATA: Sporadic dark brown vaginal spotting for 1 year, had
pessary removed yesterday due to tear and vaginal wall,
postmenopausal, postmenopausal bleeding



[Series 1: us pelvis complete with transvaginal · 13 of 40 slices shown]
[im 1/40]
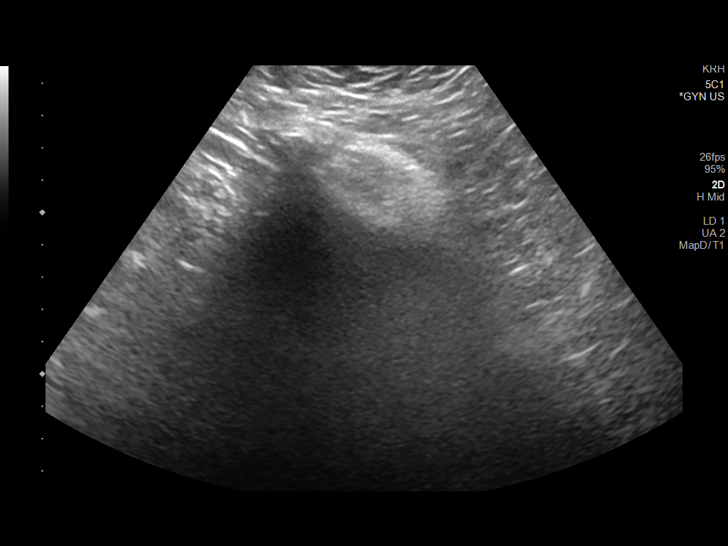
[im 4/40]
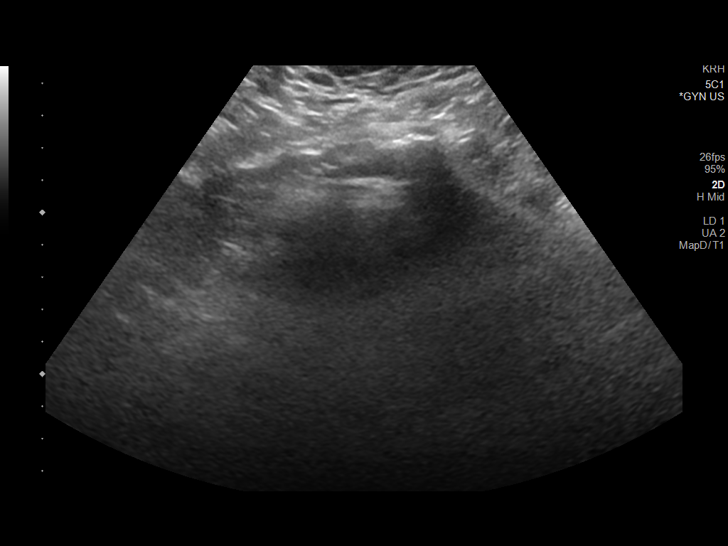
[im 7/40]
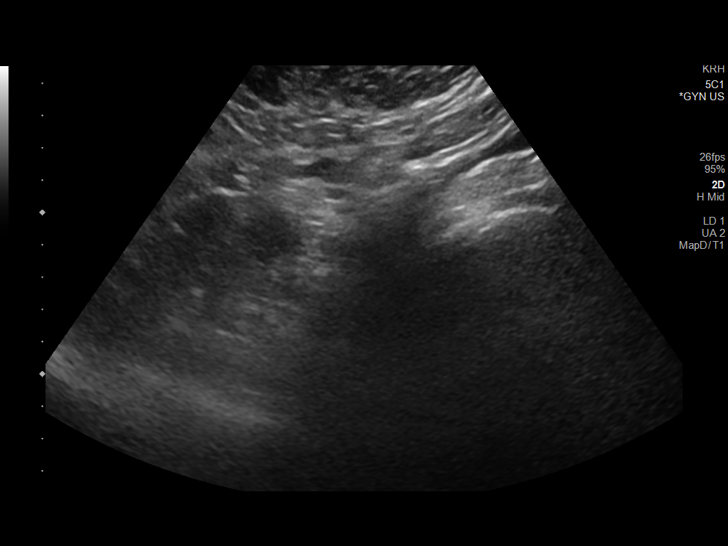
[im 10/40]
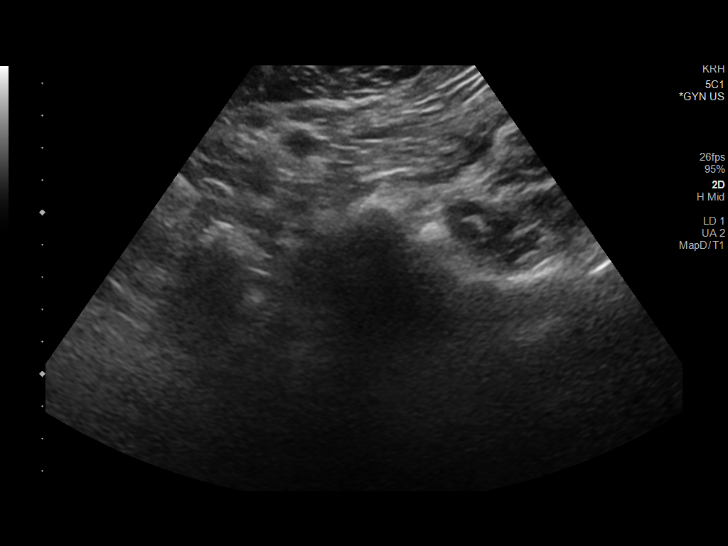
[im 14/40]
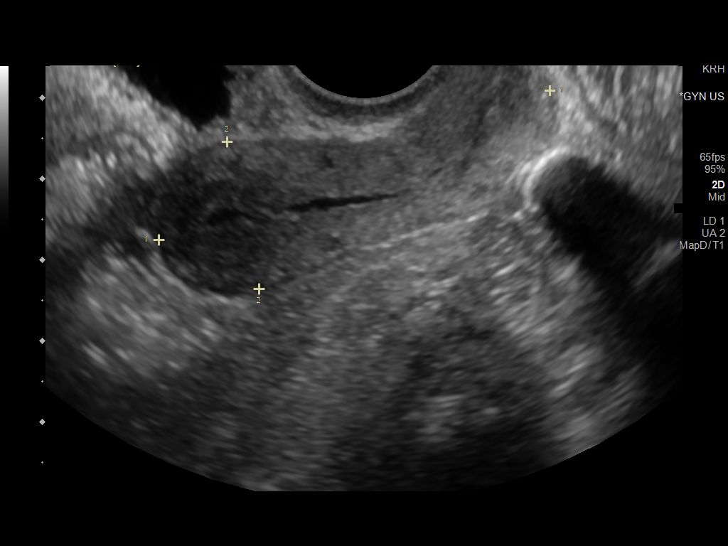
[im 17/40]
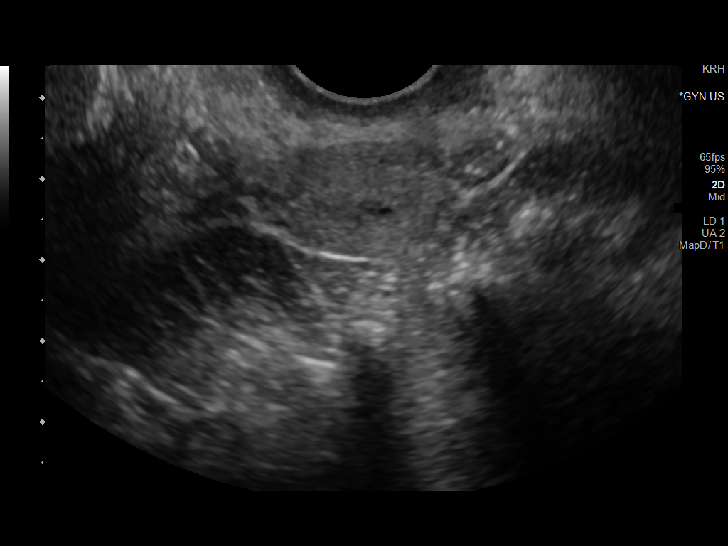
[im 20/40]
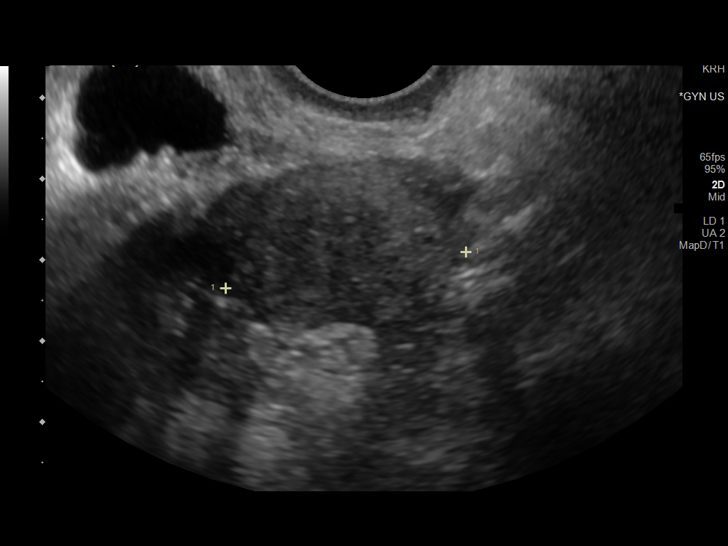
[im 23/40]
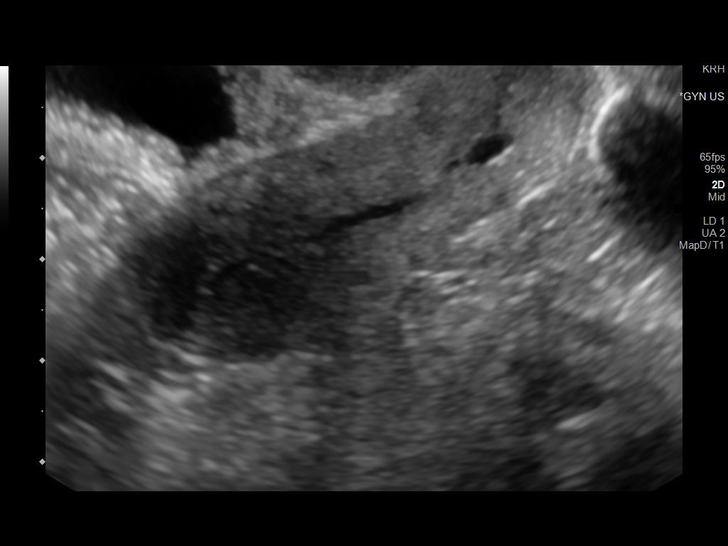
[im 27/40]
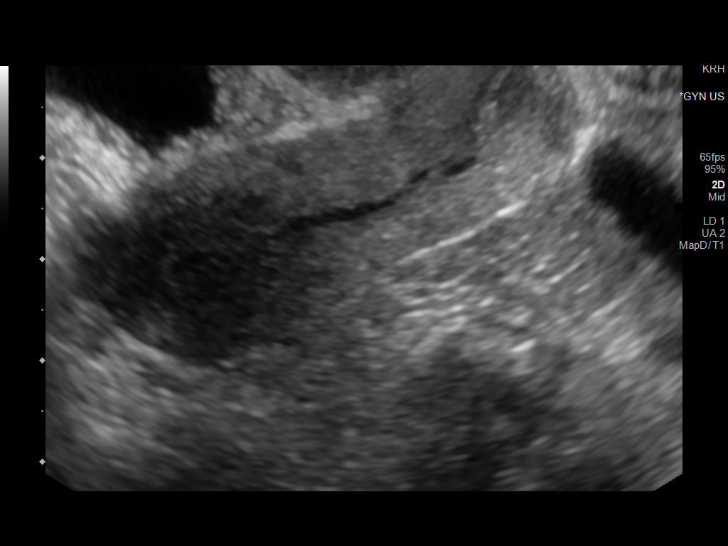
[im 30/40]
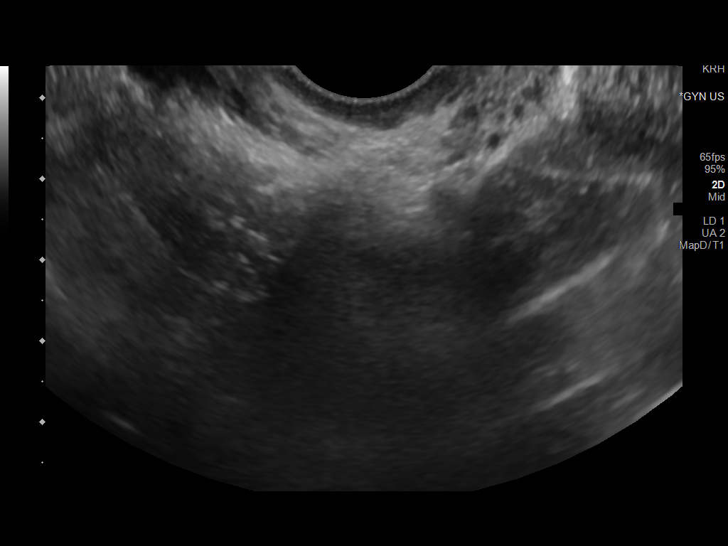
[im 33/40]
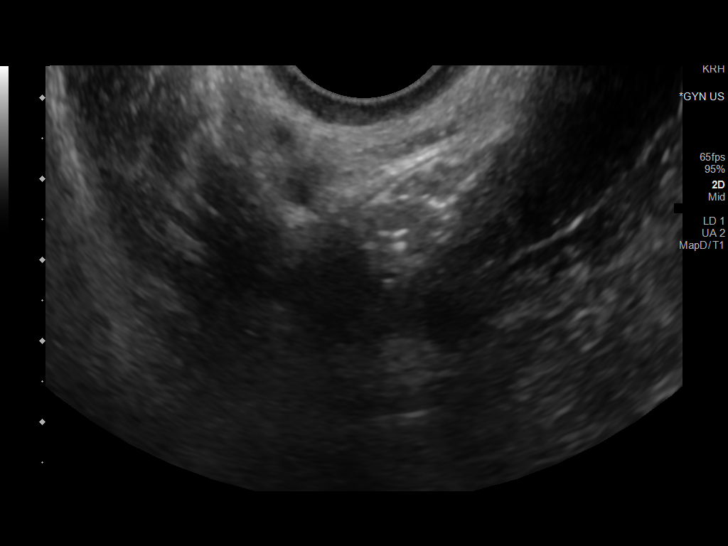
[im 36/40]
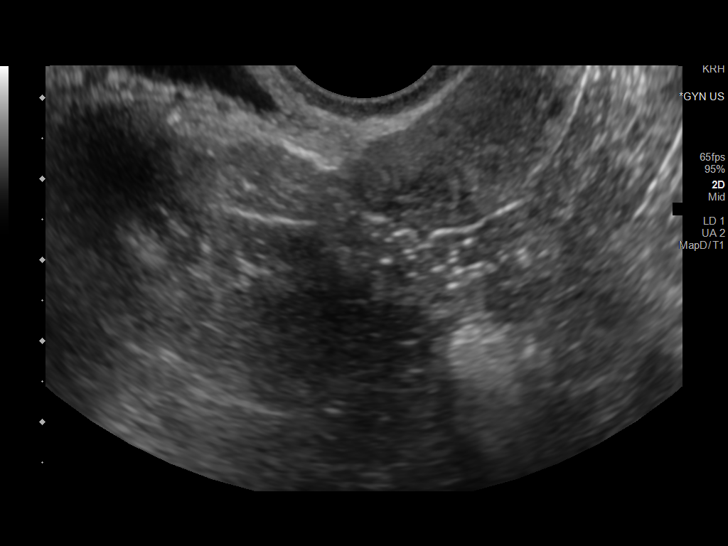
[im 40/40]
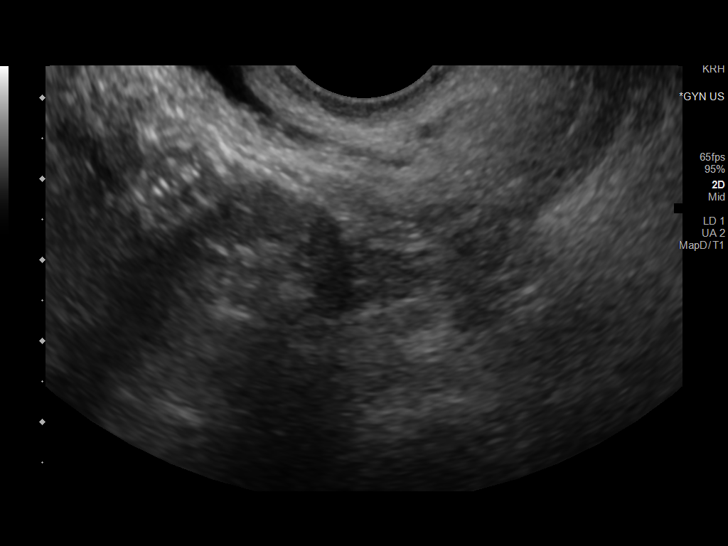

[13 of 25 positions shown; findings below may reference images not displayed]

FINDINGS: Uterus

Measurements: 5.2 x 1.9 x 3.0 cm = volume: 15 mL. Anteverted.
Slightly heterogeneous myometrium without mass. Incidentally noted
small nabothian cyst at cervix.

Endometrium

Thickness: 2 mm.  Small amount of nonspecific endometrial fluid.

Right ovary

Not visualized on either transabdominal or endovaginal imaging,
question due to atrophy versus obscured by bowel

Left ovary

Not visualized on either transabdominal or endovaginal imaging,
question due to atrophy versus obscured by bowel

Other findings

No free pelvic fluid.  No adnexal masses.
IMPRESSION: Nonvisualization of ovaries.

Normal thickness of endometrial complex 2 mm thick with small amount
of nonspecific endometrial fluid.

In the setting of post-menopausal bleeding, this is consistent with
a benign etiology such as endometrial atrophy. If bleeding remains
unresponsive to hormonal or medical therapy, sonohysterogram should
be considered for focal lesion work-up. (Ref: Radiological
Reasoning: Algorithmic Workup of Abnormal Vaginal Bleeding with
Endovaginal Sonography and Sonohysterography. [id number]; 191:S68-73)

## 2021-04-21 ENCOUNTER — Other Ambulatory Visit (INDEPENDENT_AMBULATORY_CARE_PROVIDER_SITE_OTHER): Payer: Medicare Other

## 2021-04-21 ENCOUNTER — Encounter: Payer: Self-pay | Admitting: Family Medicine

## 2021-04-21 DIAGNOSIS — E034 Atrophy of thyroid (acquired): Secondary | ICD-10-CM | POA: Diagnosis not present

## 2021-04-21 LAB — TSH: TSH: 1.88 u[IU]/mL (ref 0.35–5.50)

## 2021-04-27 DIAGNOSIS — Z961 Presence of intraocular lens: Secondary | ICD-10-CM | POA: Diagnosis not present

## 2021-04-27 DIAGNOSIS — H35371 Puckering of macula, right eye: Secondary | ICD-10-CM | POA: Diagnosis not present

## 2021-04-27 DIAGNOSIS — H43813 Vitreous degeneration, bilateral: Secondary | ICD-10-CM | POA: Diagnosis not present

## 2021-04-27 DIAGNOSIS — H34832 Tributary (branch) retinal vein occlusion, left eye, with macular edema: Secondary | ICD-10-CM | POA: Diagnosis not present

## 2021-05-23 DIAGNOSIS — Z20822 Contact with and (suspected) exposure to covid-19: Secondary | ICD-10-CM | POA: Diagnosis not present

## 2021-06-02 ENCOUNTER — Encounter: Payer: Self-pay | Admitting: Family Medicine

## 2021-06-02 DIAGNOSIS — B009 Herpesviral infection, unspecified: Secondary | ICD-10-CM

## 2021-06-02 MED ORDER — ACYCLOVIR 400 MG PO TABS: 400.0000 mg | ORAL_TABLET | Freq: Two times a day (BID) | ORAL | 0 refills | Status: DC

## 2021-07-06 ENCOUNTER — Encounter: Payer: Self-pay | Admitting: Family Medicine

## 2021-07-06 ENCOUNTER — Other Ambulatory Visit: Payer: Self-pay

## 2021-07-06 ENCOUNTER — Encounter: Payer: Self-pay | Admitting: Cardiology

## 2021-07-06 DIAGNOSIS — K219 Gastro-esophageal reflux disease without esophagitis: Secondary | ICD-10-CM

## 2021-07-06 MED ORDER — ATORVASTATIN CALCIUM 80 MG PO TABS: 80.0000 mg | ORAL_TABLET | Freq: Every day | ORAL | 0 refills | Status: DC

## 2021-07-06 MED ORDER — OMEPRAZOLE 40 MG PO CPDR
40.0000 mg | DELAYED_RELEASE_CAPSULE | Freq: Every day | ORAL | 3 refills | Status: DC
Start: 2021-07-06 — End: 2022-08-28

## 2021-07-06 MED ORDER — EZETIMIBE 10 MG PO TABS: 10.0000 mg | ORAL_TABLET | Freq: Every day | ORAL | 0 refills | Status: DC

## 2021-07-08 ENCOUNTER — Other Ambulatory Visit (HOSPITAL_BASED_OUTPATIENT_CLINIC_OR_DEPARTMENT_OTHER): Payer: Self-pay

## 2021-07-15 ENCOUNTER — Encounter: Payer: Self-pay | Admitting: Family Medicine

## 2021-07-23 DIAGNOSIS — Z1211 Encounter for screening for malignant neoplasm of colon: Secondary | ICD-10-CM | POA: Diagnosis not present

## 2021-07-26 ENCOUNTER — Other Ambulatory Visit (HOSPITAL_BASED_OUTPATIENT_CLINIC_OR_DEPARTMENT_OTHER): Payer: Self-pay

## 2021-07-27 DIAGNOSIS — H43813 Vitreous degeneration, bilateral: Secondary | ICD-10-CM | POA: Diagnosis not present

## 2021-07-27 DIAGNOSIS — H35042 Retinal micro-aneurysms, unspecified, left eye: Secondary | ICD-10-CM | POA: Diagnosis not present

## 2021-07-27 DIAGNOSIS — Z961 Presence of intraocular lens: Secondary | ICD-10-CM | POA: Diagnosis not present

## 2021-07-27 DIAGNOSIS — H34832 Tributary (branch) retinal vein occlusion, left eye, with macular edema: Secondary | ICD-10-CM | POA: Diagnosis not present

## 2021-07-30 LAB — COLOGUARD: COLOGUARD: NEGATIVE

## 2021-07-31 ENCOUNTER — Encounter: Payer: Self-pay | Admitting: Family Medicine

## 2021-08-05 ENCOUNTER — Other Ambulatory Visit (HOSPITAL_BASED_OUTPATIENT_CLINIC_OR_DEPARTMENT_OTHER): Payer: Self-pay

## 2021-08-10 ENCOUNTER — Ambulatory Visit (INDEPENDENT_AMBULATORY_CARE_PROVIDER_SITE_OTHER): Payer: Medicare Other | Admitting: Obstetrics & Gynecology

## 2021-08-10 ENCOUNTER — Encounter: Payer: Self-pay | Admitting: Obstetrics & Gynecology

## 2021-08-10 ENCOUNTER — Telehealth: Payer: Medicare Other | Admitting: Obstetrics & Gynecology

## 2021-08-10 DIAGNOSIS — N950 Postmenopausal bleeding: Secondary | ICD-10-CM | POA: Diagnosis not present

## 2021-08-10 DIAGNOSIS — N819 Female genital prolapse, unspecified: Secondary | ICD-10-CM | POA: Diagnosis not present

## 2021-08-10 NOTE — Progress Notes (Signed)
? ?TELEHEALTH GYNECOLOGY VISIT ENCOUNTER NOTE ? ?Provider location: [facility] [employer] [facility]  ? ?Patient location: Home ? ?I connected with [name] on 08/10/21 at  1:50 PM EDT by telephone and verified that I am speaking with the correct person using two identifiers. Patient was unable to do MyChart audiovisual encounter due to technical difficulties, she tried several times.  ?  ?I discussed the limitations, risks, security and privacy concerns of performing an evaluation and management service by telephone and the availability of in person appointments. I also discussed with the patient that there may be a patient responsible charge related to this service. The patient expressed understanding and agreed to proceed. ?  ?History:  ?Benetta Maclaren is a 71 y.o. G49P1011 female being evaluated today for POP. Pt does not want to have surgery. Pt reports bleeding daily after a BM. This has occurred over the past 6 months. She thinks its due tissue being thin. She removes the pessary every 2-3 months. Pt reports that the bleeding resolved moments after the BM.  Bleeding was prev only with removal of the pessary. Pt thinks the pessary is 'fairly comfortable'.  She only has to use the Clobetasol 2-3x/weeks.  She denies any bleeding, pelvic pain or other concerns.   ?  ?  ?Past Medical History:  ?Diagnosis Date  ? Arthritis   ? Bilateral carotid bruits   ? 1-39% bilateral ICA stenosis  ? Branch retinal vein occlusion of left eye with macular edema 01/09/2018  ? Chicken pox   ? Closed fracture of left wrist 12/13/2018  ? Formatting of this note might be different from the original. Added automatically from request for surgery [id number]  ? Diverticulosis of colon 05/16/2015  ? Dyslipidemia   ? Genital herpes   ? GERD (gastroesophageal reflux disease)   ? Hepatitis B   ? HSV-2 (herpes simplex virus 2) infection 06/11/2016  ? Hypothyroidism   ? Hypothyroidism (acquired) 06/11/2016  ? Kidney stone   ?  NSTEMI (non-ST elevated myocardial infarction) (HCC) 04/15/2018  ? Troponin 0.58 on 04/15/18  ? Osteoarthritis 06/11/2016  ? Osteopenia 11/30/2017  ? Pancreatitis   ? Peripheral vascular disease, unspecified (HCC) right subclavian artery stenosis 04/18/2019  ? Prediabetes 03/11/2020  ? Pseudophakia of both eyes 02/18/2018  ? PVD (posterior vitreous detachment), bilateral 02/18/2018  ? S/P CABG x 4 04/15/18 04/17/2018  ? LIMA to LAD SVG to DIAGONAL SVG to OM SVG to RCA  ? Systolic murmur   ? ?Past Surgical History:  ?Procedure Laterality Date  ? APPENDECTOMY  1972  ? CORONARY ARTERY BYPASS GRAFT N/A 04/17/2018  ? Procedure: CORONARY ARTERY BYPASS GRAFTING (CABG) TIMES USING LEFT INTERNAL MAMMARY ARTERY TO LAD AND SAPHENOUS VEIN TO DIAG 1, OM1, AND RCA;  Surgeon: Delight Ovens, MD;  Location: [facility] OR;  Service: Open Heart Surgery;  Laterality: N/A;  ? ENDOVEIN HARVEST OF GREATER SAPHENOUS VEIN Right 04/17/2018  ? Procedure: ENDOVEIN HARVEST OF GREATER SAPHENOUS VEIN RIGHT THIGH AND CALF;  Surgeon: Delight Ovens, MD;  Location: [facility] OR;  Service: Open Heart Surgery;  Laterality: Right;  ? LAPAROSCOPY    ? LEFT HEART CATH AND CORONARY ANGIOGRAPHY N/A 04/16/2018  ? Procedure: LEFT HEART CATH AND CORONARY ANGIOGRAPHY;  Surgeon: Kathleene Hazel, MD;  Location: [facility];  Service: Cardiovascular;  Laterality: N/A;  ? OVARIAN CYST SURGERY    ? TEE WITHOUT CARDIOVERSION N/A 04/17/2018  ? Procedure: TRANSESOPHAGEAL ECHOCARDIOGRAM (TEE);  Surgeon: Delight Ovens,  MD;  Location: [facility] OR;  Service: Open Heart Surgery;  Laterality: N/A;  ? TONSILLECTOMY    ? ?The following portions of the patient's history were reviewed and updated as appropriate: allergies, current medications, past family history, past medical history, past social history, past surgical history and problem list.  ? ? ? ?Review of Systems:  ?Pertinent items noted in HPI and remainder of comprehensive ROS otherwise negative. ? ?Physical Exam:   ? ?General:  Alert, oriented and cooperative.   ?Mental Status: Normal mood and affect perceived. Normal judgment and thought content.  ?Physical exam deferred due to nature of the encounter ? ? ?Assessment and Plan:  ?   ?Uterine prolapse with pessary  ?Abnormal bleeding    ?   ?  ?I discussed the assessment and treatment plan with the patient. I recommend an office visit to eval the source of the bleeding and to try a different style of pessary. (Pt is a bit worried about the bleeding after fitting). I'm concerned that her pessary isn't fitting properly if she cont to bleed.  The patient was provided an opportunity to ask questions and all were answered. The patient agreed with the plan and demonstrated an understanding of the instructions. ?  ?The patient was advised to call back or seek an in-person evaluation/go to the ED if the symptoms worsen or if the condition fails to improve as anticipated. ? ?I provided 15 minutes of non-face-to-face time during this encounter. ? ? ?Willodean Rosenthal, MD ?[facility] [employer], [facility]  ?

## 2021-09-07 ENCOUNTER — Telehealth: Payer: Self-pay | Admitting: Family Medicine

## 2021-09-07 NOTE — Telephone Encounter (Signed)
Left message for patient to call back and schedule Medicare Annual Wellness Visit (AWV) in office.  ? ?If not able to come in office, please offer to do virtually or by telephone.  Left office number and my jabber #[phone]. ? ?Last AWV:09/09/2020 ? ?Please schedule at anytime with Nurse Health Advisor. ?  ?

## 2021-09-08 ENCOUNTER — Ambulatory Visit (INDEPENDENT_AMBULATORY_CARE_PROVIDER_SITE_OTHER): Payer: Medicare Other

## 2021-09-08 VITALS — Ht 61.0 in | Wt 167.0 lb

## 2021-09-08 DIAGNOSIS — Z0000 Encounter for general adult medical examination without abnormal findings: Secondary | ICD-10-CM

## 2021-09-08 NOTE — Progress Notes (Signed)
? ?Subjective:  ? [name] is a 71 y.o. female who presents for Medicare Annual (Subsequent) preventive examination. ?Virtual Visit via Telephone Note ? ?I connected with  Kimberly Lutesarolyn Barnes on 09/08/21 at 11:15 AM EDT by telephone and verified that I am speaking with the correct person using two identifiers. ? ?Location: ?Patient: HOME ?Provider: [facility] ?Persons participating in the virtual visit: patient/Nurse Health Advisor ?  ?I discussed the limitations, risks, security and privacy concerns of performing an evaluation and management service by telephone and the availability of in person appointments. The patient expressed understanding and agreed to proceed. ? ?Interactive audio and video telecommunications were attempted between this nurse and patient, however failed, due to patient having technical difficulties OR patient did not have access to video capability.  We continued and completed visit with audio only. ? ?Some vital signs may be absent or patient reported.  ? ?Darral DashMary Jane K Shirrell Solinger, LPN ? ?Review of Systems    ? ?Cardiac Risk Factors include: advanced age (>9855men, 32>65 women);dyslipidemia;sedentary lifestyle;obesity (BMI >30kg/m2) ? ?   ?Objective:  ?  ?Today's Vitals  ? 09/08/21 1123  ?Weight: 167 lb (75.8 kg)  ?Height: 5\' 1"  (1.549 m)  ? ?Body mass index is 31.55 kg/m?. ? ? ?  09/08/2021  ? 11:35 AM 09/09/2020  ? 12:42 PM 09/04/2019  ?  8:53 AM 12/09/2018  ?  1:32 PM 04/16/2018  ?  1:00 AM  ?Advanced Directives  ?Does Patient Have a Medical Advance Directive? Yes Yes Yes Yes No  ?Type of Estate agentAdvance Directive Healthcare Power of Marist CollegeAttorney;Living will Healthcare Power of [place];Living will Living will;Healthcare Power of [employer] Corporationttorney Healthcare Power of Attorney   ?Does patient want to make changes to medical advance directive?   No - Patient declined    ?Copy of Healthcare Power of Attorney in Chart? No - copy requested No - copy requested No - copy requested    ?Would patient like information on creating a  medical advance directive?     Yes (Inpatient - patient requests chaplain consult to create a medical advance directive)  ? ? ?Current Medications (verified) ?Outpatient Encounter Medications as of 09/08/2021  ?Medication Sig  ? acetaminophen (TYLENOL) 500 MG tablet Take 500 mg by mouth every 6 (six) hours as needed.  ? acyclovir (ZOVIRAX) 400 MG tablet Take 1 tablet (400 mg total) by mouth 2 (two) times daily.  ? aspirin EC 81 MG EC tablet Take 1 tablet (81 mg total) by mouth daily.  ? atorvastatin (LIPITOR) 80 MG tablet Take 1 tablet (80 mg total) by mouth daily.  ? cholecalciferol (VITAMIN D3) 25 MCG (1000 UNIT) tablet Take 2,000 Units by mouth daily.  ? clobetasol cream (TEMOVATE) 0.05 % Apply 1 application topically 2 (two) times daily. Apply to affected area twice a day  ? ezetimibe (ZETIA) 10 MG tablet Take 1 tablet (10 mg total) by mouth daily.  ? levothyroxine (SYNTHROID) 88 MCG tablet TAKE 1 TABLET BY MOUTH ONCE DAILY BEFORE BREAKFAST  ? metoprolol tartrate (LOPRESSOR) 25 MG tablet Take 1/2 (one-half) tablet by mouth twice daily  ? omeprazole (PRILOSEC) 40 MG capsule Take 1 capsule (40 mg total) by mouth daily.  ? [DISCONTINUED] ciprofloxacin (CIPRO) 500 MG tablet Take one twice a days for 5- 7 days  ? [DISCONTINUED] influenza vaccine adjuvanted (FLUAD) 0.5 ML injection Inject into the muscle.  ? ?No facility-administered encounter medications on file as of 09/08/2021.  ? ? ?Allergies (verified) ?Dye fdc blue  [fd&c blue #1 (brilliant blue fcf)],  Contrast media [iodinated contrast media], and Scopolamine  ? ?History: ?Past Medical History:  ?Diagnosis Date  ? Allergy 1979  ? Arthritis   ? Bilateral carotid bruits   ? 1-39% bilateral ICA stenosis  ? Blood transfusion without reported diagnosis   ? Branch retinal vein occlusion of left eye with macular edema 01/09/2018  ? Cataract 2016  ? Bilateral cataract surgery 2017  ? Chicken pox   ? Closed fracture of left wrist 12/13/2018  ? Formatting of this note  might be different from the original. Added automatically from request for surgery [id number]  ? Diverticulosis of colon 05/16/2015  ? Dyslipidemia   ? Genital herpes   ? GERD (gastroesophageal reflux disease)   ? Hepatitis B   ? HSV-2 (herpes simplex virus 2) infection 06/11/2016  ? Hyperlipidemia 1974  ? On meds  ? Hypothyroidism   ? Hypothyroidism (acquired) 06/11/2016  ? Kidney stone   ? NSTEMI (non-ST elevated myocardial infarction) (HCC) 04/15/2018  ? Troponin 0.58 on 04/15/18  ? Osteoarthritis 06/11/2016  ? Osteopenia 11/30/2017  ? Pancreatitis   ? Peripheral vascular disease, unspecified (HCC) right subclavian artery stenosis 04/18/2019  ? Prediabetes 03/11/2020  ? Pseudophakia of both eyes 02/18/2018  ? PVD (posterior vitreous detachment), bilateral 02/18/2018  ? S/P CABG x 4 04/15/18 04/17/2018  ? LIMA to LAD SVG to DIAGONAL SVG to OM SVG to RCA  ? Substance abuse (HCC) 1964  ? Clean and sober since 07/29/1989  ? Systolic murmur   ? ?Past Surgical History:  ?Procedure Laterality Date  ? APPENDECTOMY  1972  ? CORONARY ARTERY BYPASS GRAFT N/A 04/17/2018  ? Procedure: CORONARY ARTERY BYPASS GRAFTING (CABG) TIMES USING LEFT INTERNAL MAMMARY ARTERY TO LAD AND SAPHENOUS VEIN TO DIAG 1, OM1, AND RCA;  Surgeon: Delight Ovens, MD;  Location: [facility] OR;  Service: Open Heart Surgery;  Laterality: N/A;  ? ENDOVEIN HARVEST OF GREATER SAPHENOUS VEIN Right 04/17/2018  ? Procedure: ENDOVEIN HARVEST OF GREATER SAPHENOUS VEIN RIGHT THIGH AND CALF;  Surgeon: Delight Ovens, MD;  Location: [facility] OR;  Service: Open Heart Surgery;  Laterality: Right;  ? EYE SURGERY  11/2015  ? Bilateral cataract surgery  ? FRACTURE SURGERY  R clavicle fx 1989?  ? Repair failed  ? LAPAROSCOPY    ? LEFT HEART CATH AND CORONARY ANGIOGRAPHY N/A 04/16/2018  ? Procedure: LEFT HEART CATH AND CORONARY ANGIOGRAPHY;  Surgeon: Kathleene Hazel, MD;  [facility]: [facility];  Service: Cardiovascular;  Laterality: N/A;  ? OVARIAN CYST SURGERY    ?  TEE WITHOUT CARDIOVERSION N/A 04/17/2018  ? Procedure: TRANSESOPHAGEAL ECHOCARDIOGRAM (TEE);  Surgeon: Delight Ovens, MD;  Location: [facility] OR;  Service: Open Heart Surgery;  Laterality: N/A;  ? TONSILLECTOMY    ? WRIST SURGERY Left 11/2018  ? Plate placement.  ? ?Family History  ?Problem Relation Age of Onset  ? Alcoholism Mother   ? Kidney disease Mother   ? Kidney Stones Mother   ? Liver cancer Mother   ? Cancer Mother   ? Vision loss Mother   ? Alcoholism Father   ? Hyperlipidemia Father   ? Heart disease Father   ? Heart failure Father   ? Alcohol abuse Father   ? Arthritis Father   ? Alcoholism Maternal Grandfather   ? Hearing loss Brother   ? Heart disease Brother   ? Hyperlipidemia Brother   ? Hypertension Brother   ? ?Social History  ? ?Socioeconomic History  ? Marital status: Divorced  ?  Spouse name: Not on file  ? Number of children: Not on file  ? Years of education: Not on file  ? Highest education level: Not on file  ?Occupational History  ? Not on file  ?Tobacco Use  ? Smoking status: Former  ?  Packs/day: 3.00  ?  Years: 28.00  ?  Pack years: 84.00  ?  Types: Cigarettes  ?  Quit date: 04/22/1991  ?  Years since quitting: 30.4  ? Smokeless tobacco: Never  ?Vaping Use  ? Vaping Use: Never used  ?Substance and Sexual Activity  ? Alcohol use: Not Currently  ?  Comment: Last drink 07/1989  ? Drug use: Not Currently  ?  Types: Amphetamines, Barbituates, Benzodiazepines, "Crack" cocaine, Cocaine, Heroin, LSD, Methaqualone, Psilocybin  ? Sexual activity: Not Currently  ?  Birth control/protection: Post-menopausal  ?Other Topics Concern  ? Not on file  ?Social History Narrative  ? Not on file  ? ?Social Determinants of Health  ? ?Financial Resource Strain: Low Risk   ? Difficulty of Paying Living Expenses: Not hard at all  ?Food Insecurity: No Food Insecurity  ? Worried About Programme researcher, broadcasting/film/video in the Last Year: Never true  ? Ran Out of Food in the Last Year: Never true  ?Transportation Needs: No  Transportation Needs  ? Lack of Transportation (Medical): No  ? Lack of Transportation (Non-Medical): No  ?Physical Activity: Sufficiently Active  ? Days of Exercise per Week: 3 days  ? Minutes of Exercise per Session

## 2021-09-08 NOTE — Patient Instructions (Addendum)
[name] , ?Thank you for taking time to come for your Medicare Wellness Visit. I appreciate your ongoing commitment to your health goals. Please review the following plan we discussed and let me know if I can assist you in the future.  ? ?Screening recommendations/referrals: ?Colonoscopy: Cologuard done 07/23/2021.  ?Mammogram: Declined. ?Bone Density: Discussed. ?Recommended yearly ophthalmology/optometry visit for glaucoma screening and checkup ?Recommended yearly dental visit for hygiene and checkup ? ?Vaccinations: ?Influenza vaccine: Done 02/01/2021 Repeat annually ? ?Pneumococcal vaccine: Done 05/30/2014 and 05/15/2016 ?Tdap vaccine: Done 05/15/2013 Repeat in 10 years ? ?Shingles vaccine: Discussed.   ?Covid-19:Done 03/04/2021, 08/20/2020, 02/17/2020, 07/11/2019 and 06/20/2019. ? ?Advanced directives: Please bring a copy of your health care power of attorney and living will to the office to be added to your chart at your convenience. ? ? ?Conditions/risks identified: KEEP UP THE GOOD WORK!! ? ?Next appointment: Follow up in one year for your annual wellness visit . 09/18/2022 @ 11:15 AM.  ? ? ?Preventive Care 50 Years and Older, Female ?Preventive care refers to lifestyle choices and visits with your health care provider that can promote health and wellness. ?What does preventive care include? ?A yearly physical exam. This is also called an annual well check. ?Dental exams once or twice a year. ?Routine eye exams. Ask your health care provider how often you should have your eyes checked. ?Personal lifestyle choices, including: ?Daily care of your teeth and gums. ?Regular physical activity. ?Eating a healthy diet. ?Avoiding tobacco and drug use. ?Limiting alcohol use. ?Practicing safe sex. ?Taking low-dose aspirin every day. ?Taking vitamin and mineral supplements as recommended by your health care provider. ?What happens during an annual well check? ?The services and screenings done by your health care provider during  your annual well check will depend on your age, overall health, lifestyle risk factors, and family history of disease. ?Counseling  ?Your health care provider may ask you questions about your: ?Alcohol use. ?Tobacco use. ?Drug use. ?Emotional well-being. ?Home and relationship well-being. ?Sexual activity. ?Eating habits. ?History of falls. ?Memory and ability to understand (cognition). ?Work and work Astronomer. ?Reproductive health. ?Screening  ?You may have the following tests or measurements: ?Height, weight, and BMI. ?Blood pressure. ?Lipid and cholesterol levels. These may be checked every 5 years, or more frequently if you are over 69 years old. ?Skin check. ?Lung cancer screening. You may have this screening every year starting at age 3 if you have a 30-pack-year history of smoking and currently smoke or have quit within the past 15 years. ?Fecal occult blood test (FOBT) of the stool. You may have this test every year starting at age 26. ?Flexible sigmoidoscopy or colonoscopy. You may have a sigmoidoscopy every 5 years or a colonoscopy every 10 years starting at age 48. ?Hepatitis C blood test. ?Hepatitis B blood test. ?Sexually transmitted disease (STD) testing. ?Diabetes screening. This is done by checking your blood sugar (glucose) after you have not eaten for a while (fasting). You may have this done every 1-3 years. ?Bone density scan. This is done to screen for osteoporosis. You may have this done starting at age 82. ?Mammogram. This may be done every 1-2 years. Talk to your health care provider about how often you should have regular mammograms. ?Talk with your health care provider about your test results, treatment options, and if necessary, the need for more tests. ?Vaccines  ?Your health care provider may recommend certain vaccines, such as: ?Influenza vaccine. This is recommended every year. ?Tetanus, diphtheria, and acellular  pertussis (Tdap, Td) vaccine. You may need a Td booster every 10  years. ?Zoster vaccine. You may need this after age 44. ?Pneumococcal 13-valent conjugate (PCV13) vaccine. One dose is recommended after age 43. ?Pneumococcal polysaccharide (PPSV23) vaccine. One dose is recommended after age 9. ?Talk to your health care provider about which screenings and vaccines you need and how often you need them. ?This information is not intended to replace advice given to you by your health care provider. Make sure you discuss any questions you have with your health care provider. ?Document Released: 05/28/2015 Document Revised: 01/19/2016 Document Reviewed: 03/02/2015 ?Elsevier Interactive Patient Education ? 2017 [place]. ? ?Fall Prevention in the Home ?Falls can cause injuries. They can happen to people of all ages. There are many things you can do to make your home safe and to help prevent falls. ?What can I do on the outside of my home? ?Regularly fix the edges of walkways and driveways and fix any cracks. ?Remove anything that might make you trip as you walk through a door, such as a raised step or threshold. ?Trim any bushes or trees on the path to your home. ?Use bright outdoor lighting. ?Clear any walking paths of anything that might make someone trip, such as rocks or tools. ?Regularly check to see if handrails are loose or broken. Make sure that both sides of any steps have handrails. ?Any raised decks and porches should have guardrails on the edges. ?Have any leaves, snow, or ice cleared regularly. ?Use sand or salt on walking paths during winter. ?Clean up any spills in your garage right away. This includes oil or grease spills. ?What can I do in the bathroom? ?Use night lights. ?Install grab bars by the toilet and in the tub and shower. Do not use towel bars as grab bars. ?Use non-skid mats or decals in the tub or shower. ?If you need to sit down in the shower, use a plastic, non-slip stool. ?Keep the floor dry. Clean up any water that spills on the floor as soon as it  happens. ?Remove soap buildup in the tub or shower regularly. ?Attach bath mats securely with double-sided non-slip rug tape. ?Do not have throw rugs and other things on the floor that can make you trip. ?What can I do in the bedroom? ?Use night lights. ?Make sure that you have a light by your bed that is easy to reach. ?Do not use any sheets or blankets that are too big for your bed. They should not hang down onto the floor. ?Have a firm chair that has side arms. You can use this for support while you get dressed. ?Do not have throw rugs and other things on the floor that can make you trip. ?What can I do in the kitchen? ?Clean up any spills right away. ?Avoid walking on wet floors. ?Keep items that you use a lot in easy-to-reach places. ?If you need to reach something above you, use a strong step stool that has a grab bar. ?Keep electrical cords out of the way. ?Do not use floor polish or wax that makes floors slippery. If you must use wax, use non-skid floor wax. ?Do not have throw rugs and other things on the floor that can make you trip. ?What can I do with my stairs? ?Do not leave any items on the stairs. ?Make sure that there are handrails on both sides of the stairs and use them. Fix handrails that are broken or loose. Make sure that handrails  are as long as the stairways. ?Check any carpeting to make sure that it is firmly attached to the stairs. Fix any carpet that is loose or worn. ?Avoid having throw rugs at the top or bottom of the stairs. If you do have throw rugs, attach them to the floor with carpet tape. ?Make sure that you have a light switch at the top of the stairs and the bottom of the stairs. If you do not have them, ask someone to add them for you. ?What else can I do to help prevent falls? ?Wear shoes that: ?Do not have high heels. ?Have rubber bottoms. ?Are comfortable and fit you well. ?Are closed at the toe. Do not wear sandals. ?If you use a stepladder: ?Make sure that it is fully opened.  Do not climb a closed stepladder. ?Make sure that both sides of the stepladder are locked into place. ?Ask someone to hold it for you, if possible. ?Clearly mark and make sure that you can see: ?Any grab bars

## 2021-09-13 ENCOUNTER — Encounter: Payer: Self-pay | Admitting: Family Medicine

## 2021-09-13 DIAGNOSIS — B009 Herpesviral infection, unspecified: Secondary | ICD-10-CM

## 2021-09-13 MED ORDER — ACYCLOVIR 400 MG PO TABS: 400.0000 mg | ORAL_TABLET | Freq: Two times a day (BID) | ORAL | 0 refills | Status: DC

## 2021-09-21 ENCOUNTER — Encounter: Payer: Self-pay | Admitting: Obstetrics & Gynecology

## 2021-09-21 ENCOUNTER — Ambulatory Visit (INDEPENDENT_AMBULATORY_CARE_PROVIDER_SITE_OTHER): Payer: Medicare Other | Admitting: Obstetrics & Gynecology

## 2021-09-21 ENCOUNTER — Telehealth: Payer: Self-pay | Admitting: Family Medicine

## 2021-09-21 VITALS — BP 98/66 | HR 65 | Wt 168.0 lb

## 2021-09-21 DIAGNOSIS — N950 Postmenopausal bleeding: Secondary | ICD-10-CM

## 2021-09-21 DIAGNOSIS — T839XXA Unspecified complication of genitourinary prosthetic device, implant and graft, initial encounter: Secondary | ICD-10-CM

## 2021-09-21 DIAGNOSIS — N813 Complete uterovaginal prolapse: Secondary | ICD-10-CM | POA: Diagnosis not present

## 2021-09-21 NOTE — Progress Notes (Signed)
History:  ?71 y.o. G2P1011 here today for eval of pessary. She reports that she removed the pessary and had bleeding. She reports that it was bright red and was definitely assoc with the vagina. She denies pain. She reports that with the pessary in, she has problems having a BM. With the pessary out, she hs issues voiding. She reports that it is not assoc with uterine bleeding and declines endo bx or [place]. She does report that if it persists she will allow a workup. .     ? ?The following portions of the patient's history were reviewed and updated as appropriate: allergies, current medications, past family history, past medical history, past social history, past surgical history and problem list. ? ?Review of Systems:  ?Pertinent items are noted in HPI. ?   ?Objective:  ?Physical Exam ?Blood pressure 98/66, pulse 65, weight 168 lb (76.2 kg). ? ?CONSTITUTIONAL: Well-developed, well-nourished female in no acute distress.  ?HENT:  Normocephalic, atraumatic ?EYES: Conjunctivae and EOM are normal. No scleral icterus.  ?NECK: Normal range of motion ?SKIN: Skin is warm and dry. No rash noted. Not diaphoretic.No pallor. ?NEUROLGIC: Alert and oriented to person, place, and time. Normal coordination.   ?Abd: Soft, nontender and nondistended ?Pelvic: Normal appearing external genitalia; there is a small laceration in the post vagina. The vaginal mucosa and cervix or otherwise normal. Normal discharge.  Small uterus, no other palpable masses, no uterine or adnexal tenderness ? ?PESSARY FITTING AND PLACEMENT ?During pelvic exam, patient was examined. Her vaginal introitus size, vaginal length, and prolapse stage were used to guide selection of pessary type and size. After evaluation, a doughnut pessary was attempted but fell out. The size up was too large and was uncomfortable. The lac was treated with silver nitrate and the pts pessary with know was reinserted at the pts request.  EES was applied to the pessary. The patient  walked around room with it in place.   ? ?Assessment & Plan:  ?Diagnoses and all orders for this visit: ? ?Complete uterine prolapse ? ?Problem with vaginal pessary, initial encounter (HCC) ? ? Vaginal bleeding from laceration. D/w pt other possibilities for bleeding. Pt agreed to a workup if the bleeding returned. 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  ? ?F/u in 2 months or sooner prn  ? ?Total face-to-face time with patient, review of chart, discussion with consultant and coordination of care was .   ? ? ?Sallyann L. Harraway-Smith, M.D., FACOG ? ?   ? ?

## 2021-09-21 NOTE — Telephone Encounter (Signed)
Received and scanned.

## 2021-09-21 NOTE — Telephone Encounter (Signed)
Pt dropped off copy of her Healthcare Power of Attorney-7 pages for provider to have on pt's chart. Document put at front office tray under providers name.  ?

## 2021-09-27 ENCOUNTER — Encounter: Payer: Self-pay | Admitting: Cardiology

## 2021-09-27 ENCOUNTER — Telehealth: Payer: Self-pay

## 2021-09-27 ENCOUNTER — Other Ambulatory Visit: Payer: Self-pay | Admitting: Family Medicine

## 2021-09-27 DIAGNOSIS — E034 Atrophy of thyroid (acquired): Secondary | ICD-10-CM

## 2021-09-27 MED ORDER — EZETIMIBE 10 MG PO TABS: 10.0000 mg | ORAL_TABLET | Freq: Every day | ORAL | 3 refills | Status: DC

## 2021-09-27 NOTE — Telephone Encounter (Signed)
Called and [facility]- computer warning about blue eye for her thyroid replacement,  not sure if this is a new or old warning.  In any case confirm no dye she is allergic to prior to taking ?

## 2021-09-27 NOTE — Telephone Encounter (Signed)
Zetia 10mg  refilled #[id number] ref.Sent to pharmacy ?

## 2021-09-29 MED ORDER — EZETIMIBE 10 MG PO TABS: 10.0000 mg | ORAL_TABLET | Freq: Every day | ORAL | 0 refills | Status: DC

## 2021-09-29 MED ORDER — ATORVASTATIN CALCIUM 80 MG PO TABS: 80.0000 mg | ORAL_TABLET | Freq: Every day | ORAL | 0 refills | Status: DC

## 2021-10-18 ENCOUNTER — Other Ambulatory Visit: Payer: Self-pay

## 2021-10-18 ENCOUNTER — Encounter: Payer: Self-pay | Admitting: Obstetrics & Gynecology

## 2021-10-18 MED ORDER — FLUCONAZOLE 150 MG PO TABS: 150.0000 mg | ORAL_TABLET | Freq: Once | ORAL | 0 refills | Status: AC

## 2021-11-04 ENCOUNTER — Other Ambulatory Visit: Payer: Self-pay

## 2021-11-04 ENCOUNTER — Encounter: Payer: Self-pay | Admitting: Cardiology

## 2021-11-04 MED ORDER — ATORVASTATIN CALCIUM 80 MG PO TABS: 80.0000 mg | ORAL_TABLET | Freq: Every day | ORAL | 3 refills | Status: DC

## 2021-11-21 ENCOUNTER — Ambulatory Visit (INDEPENDENT_AMBULATORY_CARE_PROVIDER_SITE_OTHER): Payer: Medicare Other | Admitting: Cardiology

## 2021-11-21 ENCOUNTER — Encounter: Payer: Self-pay | Admitting: Cardiology

## 2021-11-21 VITALS — BP 100/70 | HR 57 | Ht 60.0 in | Wt 165.0 lb

## 2021-11-21 DIAGNOSIS — E785 Hyperlipidemia, unspecified: Secondary | ICD-10-CM | POA: Diagnosis not present

## 2021-11-21 DIAGNOSIS — Z951 Presence of aortocoronary bypass graft: Secondary | ICD-10-CM | POA: Diagnosis not present

## 2021-11-21 DIAGNOSIS — R7303 Prediabetes: Secondary | ICD-10-CM

## 2021-11-21 DIAGNOSIS — R0609 Other forms of dyspnea: Secondary | ICD-10-CM

## 2021-11-21 DIAGNOSIS — I739 Peripheral vascular disease, unspecified: Secondary | ICD-10-CM | POA: Diagnosis not present

## 2021-11-21 DIAGNOSIS — R0989 Other specified symptoms and signs involving the circulatory and respiratory systems: Secondary | ICD-10-CM

## 2021-11-21 NOTE — Progress Notes (Signed)
Cardiology Office Note:    Date:  11/21/2021   ID:  [name], DOB [birth year], MRN [id number]  PCP:  Pearline Cables, MD  Cardiologist:  Gypsy Balsam, MD    Referring MD: Pearline Cables, MD   Chief Complaint  Patient presents with   yearly follow up  Doing well  History of Present Illness:    [name] is a 71 y.o. female with past medical history significant for coronary artery disease, status post coronary bypass grafting April 15, 2018 with LIMA to LAD, SVG to diagonal, SVG to obtuse marginal, SVG to RCA, also peripheral vascular disease with carotid bruit, dyslipidemia, essential hypertension, prediabetes. She is in my office today for follow-up.  Overall doing very well.  Denies of any chest pain tightness squeezing pressure burning chest, she goes to gym 3 times a week and doing well she is also taking care of her some friends with doing disability.  Past Medical History:  Diagnosis Date   Allergy 1979   Arthritis    Bilateral carotid bruits    1-39% bilateral ICA stenosis   Blood transfusion without reported diagnosis    Branch retinal vein occlusion of left eye with macular edema 01/09/2018   Cataract 2016   Bilateral cataract surgery 2017   Chicken pox    Closed fracture of left wrist 12/13/2018   Formatting of this note might be different from the original. Added automatically from request for surgery [id number]   Diverticulosis of colon 05/16/2015   Dyslipidemia    Genital herpes    GERD (gastroesophageal reflux disease)    Hepatitis B    HSV-2 (herpes simplex virus 2) infection 06/11/2016   Hyperlipidemia 1974   On meds   Hypothyroidism    Hypothyroidism (acquired) 06/11/2016   Kidney stone    NSTEMI (non-ST elevated myocardial infarction) (HCC) 04/15/2018   Troponin 0.58 on 04/15/18   Osteoarthritis 06/11/2016   Osteopenia 11/30/2017   Pancreatitis    Peripheral vascular disease, unspecified (HCC) right subclavian artery stenosis  04/18/2019   Prediabetes 03/11/2020   Pseudophakia of both eyes 02/18/2018   PVD (posterior vitreous detachment), bilateral 02/18/2018   S/P CABG x 4 04/15/18 04/17/2018   LIMA to LAD SVG to DIAGONAL SVG to OM SVG to RCA   Substance abuse (HCC) 1964   Clean and sober since 07/29/1989   Systolic murmur     Past Surgical History:  Procedure Laterality Date   APPENDECTOMY  1972   CORONARY ARTERY BYPASS GRAFT N/A 04/17/2018   Procedure: CORONARY ARTERY BYPASS GRAFTING (CABG) TIMES USING LEFT INTERNAL MAMMARY ARTERY TO LAD AND SAPHENOUS VEIN TO DIAG 1, OM1, AND RCA;  Surgeon: Delight Ovens, MD;  Location: [facility] OR;  Service: Open Heart Surgery;  Laterality: N/A;   ENDOVEIN HARVEST OF GREATER SAPHENOUS VEIN Right 04/17/2018   Procedure: ENDOVEIN HARVEST OF GREATER SAPHENOUS VEIN RIGHT THIGH AND CALF;  Surgeon: Delight Ovens, MD;  Location: [facility] OR;  Service: Open Heart Surgery;  Laterality: Right;   EYE SURGERY  11/2015   Bilateral cataract surgery   FRACTURE SURGERY  R clavicle fx 1989?   Repair failed   LAPAROSCOPY     LEFT HEART CATH AND CORONARY ANGIOGRAPHY N/A 04/16/2018   Procedure: LEFT HEART CATH AND CORONARY ANGIOGRAPHY;  Surgeon: Kathleene Hazel, MD;  Location: [facility] INVASIVE CV LAB;  Service: Cardiovascular;  Laterality: N/A;   OVARIAN CYST SURGERY     TEE WITHOUT CARDIOVERSION N/A 04/17/2018   Procedure: TRANSESOPHAGEAL ECHOCARDIOGRAM (  TEE);  Surgeon: Delight Ovens, MD;  Location: [facility] OR;  Service: Open Heart Surgery;  Laterality: N/A;   TONSILLECTOMY     WRIST SURGERY Left 11/2018   Plate placement.    Current Medications: Current Meds  Medication Sig   acetaminophen (TYLENOL) 500 MG tablet Take 500 mg by mouth every 6 (six) hours as needed.   acyclovir (ZOVIRAX) 400 MG tablet Take 1 tablet (400 mg total) by mouth 2 (two) times daily.   aspirin EC 81 MG EC tablet Take 1 tablet (81 mg total) by mouth daily.   atorvastatin (LIPITOR) 80 MG tablet Take 1 tablet  (80 mg total) by mouth daily.   cholecalciferol (VITAMIN D3) 25 MCG (1000 UNIT) tablet Take 2,000 Units by mouth daily.   clobetasol cream (TEMOVATE) 0.05 % Apply 1 application topically 2 (two) times daily. Apply to affected area twice a day   ezetimibe (ZETIA) 10 MG tablet Take 1 tablet (10 mg total) by mouth daily.   levothyroxine (SYNTHROID) 88 MCG tablet TAKE 1 TABLET BY MOUTH ONCE DAILY BEFORE BREAKFAST (Patient taking differently: Take 88 mcg by mouth daily before breakfast.)   metoprolol tartrate (LOPRESSOR) 25 MG tablet Take 1/2 (one-half) tablet by mouth twice daily (Patient taking differently: Take 12.5 mg by mouth 2 (two) times daily. Take 1/2 (one-half) tablet by mouth twice daily)   omeprazole (PRILOSEC) 40 MG capsule Take 1 capsule (40 mg total) by mouth daily.     Allergies:   Dye fdc blue  [fd&c blue #1 (brilliant blue fcf)], Contrast media [iodinated contrast media], and Scopolamine   Social History   Socioeconomic History   Marital status: Divorced    Spouse name: Not on file   Number of children: Not on file   Years of education: Not on file   Highest education level: Not on file  Occupational History   Not on file  Tobacco Use   Smoking status: Former    Packs/day: 3.00    Years: 28.00    Total pack years: 84.00    Types: Cigarettes    Quit date: 04/22/1991    Years since quitting: 30.6   Smokeless tobacco: Never  Vaping Use   Vaping Use: Never used  Substance and Sexual Activity   Alcohol use: Not Currently    Comment: Last drink 07/1989   Drug use: Not Currently    Types: Amphetamines, Barbituates, Benzodiazepines, "Crack" cocaine, Cocaine, Heroin, LSD, Methaqualone, Psilocybin   Sexual activity: Not Currently    Birth control/protection: Post-menopausal  Other Topics Concern   Not on file  Social History Narrative   Not on file   Social Determinants of Health   Financial Resource Strain: Low Risk  (09/08/2021)   Overall Financial Resource Strain  (CARDIA)    Difficulty of Paying Living Expenses: Not hard at all  Food Insecurity: No Food Insecurity (09/08/2021)   Hunger Vital Sign    Worried About Running Out of Food in the Last Year: Never true    Ran Out of Food in the Last Year: Never true  Transportation Needs: No Transportation Needs (09/08/2021)   PRAPARE - Administrator, Civil Service (Medical): No    Lack of Transportation (Non-Medical): No  Physical Activity: Sufficiently Active (09/08/2021)   Exercise Vital Sign    Days of Exercise per Week: 3 days    Minutes of Exercise per Session: 60 min  Stress: No Stress Concern Present (09/08/2021)   [employer] of Occupational Health - Occupational  Stress Questionnaire    Feeling of Stress : Not at all  Social Connections: Moderately Integrated (09/08/2021)   Social Connection and Isolation Panel [NHANES]    Frequency of Communication with Friends and Family: More than three times a week    Frequency of Social Gatherings with Friends and Family: More than three times a week    Attends Religious Services: More than 4 times per year    Active Member of [employer] or Organizations: Yes    Attends Engineer, structural: More than 4 times per year    Marital Status: Divorced     Family History: The patient's family history includes Alcohol abuse in her father; Alcoholism in her father, maternal grandfather, and mother; Arthritis in her father; Cancer in her mother; Hearing loss in her brother; Heart disease in her brother and father; Heart failure in her father; Hyperlipidemia in her brother and father; Hypertension in her brother; Kidney Stones in her mother; Kidney disease in her mother; Liver cancer in her mother; Vision loss in her mother. ROS:   Please see the history of present illness.    All 14 point review of systems negative except as described per history of present illness  EKGs/Labs/Other Studies Reviewed:      Recent Labs: 03/16/2021: BUN 9;  Creatinine, Ser 0.66; Potassium 4.3; Sodium 133 04/21/2021: TSH 1.88  Recent Lipid Panel    Component Value Date/Time   CHOL 157 09/22/2020 1026   TRIG 89 09/22/2020 1026   HDL 62 09/22/2020 1026   CHOLHDL 2.5 09/22/2020 1026   CHOLHDL 3.0 04/16/2018 0229   VLDL 14 04/16/2018 0229   LDLCALC 78 09/22/2020 1026    Physical Exam:    VS:  BP 100/70 (BP Location: Left Arm, Patient Position: Sitting)   Pulse (!) 57   Ht 5' (1.524 m)   Wt 165 lb (74.8 kg)   SpO2 97%   BMI 32.22 kg/m     Wt Readings from Last 3 Encounters:  11/21/21 165 lb (74.8 kg)  09/21/21 168 lb (76.2 kg)  09/08/21 167 lb (75.8 kg)     GEN:  Well nourished, well developed in no acute distress HEENT: Normal NECK: No JVD; No carotid bruits LYMPHATICS: No lymphadenopathy CARDIAC: RRR, no murmurs, no rubs, no gallops RESPIRATORY:  Clear to auscultation without rales, wheezing or rhonchi  ABDOMEN: Soft, non-tender, non-distended MUSCULOSKELETAL:  No edema; No deformity  SKIN: Warm and dry LOWER EXTREMITIES: no swelling NEUROLOGIC:  Alert and oriented x 3 PSYCHIATRIC:  Normal affect   ASSESSMENT:    1. S/P CABG x 4 04/15/18   2. Peripheral vascular disease, unspecified (HCC) right subclavian artery stenosis   3. Prediabetes   4. Dyslipidemia    PLAN:    In order of problems listed above:  Coronary artery disease status post coronary bypass graft asymptomatic from that point review on appropriate medication which include antiplatelets therapy and high intense statin which I will continue denies have any chest pain tightness squeezing pressure burning chest Peripheral vascular disease: We will do carotic ultrasounds make sure she does not have any new activation of the problem.  Does not have any symptoms suggesting that Prediabetes is being followed by internal medicine team I did review K PN which show me her hemoglobin A1c of 5.8 this is from November of last year. Dyslipidemia data reviewed from May of  last year with LDL of 78 HDL 62 we will repeat her fasting lipid profile.   Medication Adjustments/Labs and  Tests Ordered: Current medicines are reviewed at length with the patient today.  Concerns regarding medicines are outlined above.  No orders of the defined types were placed in this encounter.  Medication changes: No orders of the defined types were placed in this encounter.   Signed, Georgeanna Lea, MD, [facility] 11/21/2021 10:25 AM    [facility]

## 2021-11-21 NOTE — Patient Instructions (Addendum)
Medication Instructions:  Your physician recommends that you continue on your current medications as directed. Please refer to the Current Medication list given to you today.  *If you need a refill on your cardiac medications before your next appointment, please call your pharmacy*   Lab Work: Fasting Lipid panel - today 2nd Floor- [address] If you have labs (blood work) drawn today and your tests are completely normal, you will receive your results only by: MyChart Message (if you have MyChart) OR A paper copy in the mail If you have any lab test that is abnormal or we need to change your treatment, we will call you to review the results.   Testing/Procedures: Your physician has requested that you have a carotid duplex. This test is an ultrasound of the carotid arteries in your neck. It looks at blood flow through these arteries that supply the brain with blood. Allow one hour for this exam. There are no restrictions or special instructions.   Your physician has requested that you have an echocardiogram. Echocardiography is a painless test that uses sound waves to create images of your heart. It provides your doctor with information about the size and shape of your heart and how well your heart's chambers and valves are working. This procedure takes approximately one hour. There are no restrictions for this procedure.    Follow-Up: At [facility], you and your health needs are our priority.  As part of our continuing mission to provide you with exceptional heart care, we have created designated Provider Care Teams.  These Care Teams include your primary Cardiologist (physician) and Advanced Practice Providers (APPs -  Physician Assistants and Nurse Practitioners) who all work together to provide you with the care you need, when you need it.  We recommend signing up for the patient portal called "MyChart".  Sign up information is provided on this After Visit Summary.  MyChart is used to  connect with patients for Virtual Visits (Telemedicine).  Patients are able to view lab/test results, encounter notes, upcoming appointments, etc.  Non-urgent messages can be sent to your provider as well.   To learn more about what you can do with MyChart, go to [email or web address].    Your next appointment:   12 month  The format for your next appointment:   In Person  Provider:   Gypsy Balsam, MD    Other Instructions NA

## 2021-11-22 LAB — LIPID PANEL
Chol/HDL Ratio: 2.7 ratio (ref 0.0–4.4)
Cholesterol, Total: 145 mg/dL (ref 100–199)
HDL: 53 mg/dL (ref >39)
LDL Chol Calc (NIH): 69 mg/dL (ref 0–99)
Triglycerides: 130 mg/dL (ref 0–149)
VLDL Cholesterol Cal: 23 mg/dL (ref 5–40)

## 2021-11-23 ENCOUNTER — Ambulatory Visit (INDEPENDENT_AMBULATORY_CARE_PROVIDER_SITE_OTHER): Payer: Medicare Other | Admitting: Obstetrics & Gynecology

## 2021-11-23 ENCOUNTER — Encounter: Payer: Self-pay | Admitting: Obstetrics & Gynecology

## 2021-11-23 VITALS — BP 124/55 | HR 64 | Wt 166.0 lb

## 2021-11-23 DIAGNOSIS — B009 Herpesviral infection, unspecified: Secondary | ICD-10-CM

## 2021-11-23 NOTE — Progress Notes (Signed)
History:  71 y.o. G2P1011 here today for pessary change. Pain in vulva. H/o HSV. Has not had an HSV outbreak in years. Has been on suppression.   She reports that the pain was severe. It is better but, still uncomfortable.   The following portions of the patient's history were reviewed and updated as appropriate: allergies, current medications, past family history, past medical history, past social history, past surgical history and problem list.  Review of Systems:  Pertinent items are noted in HPI.    Objective:  Physical Exam Blood pressure (!) 124/55, pulse 64, weight 166 lb (75.3 kg).  CONSTITUTIONAL: Well-developed, well-nourished female in no acute distress.  HENT:  Normocephalic, atraumatic EYES: Conjunctivae and EOM are normal. No scleral icterus.  NECK: Normal range of motion SKIN: Skin is warm and dry. No rash noted. Not diaphoretic.No pallor. NEUROLGIC: Alert and oriented to person, place, and time. Normal coordination.  Pelvic: Normal appearing external genitalia- on the right labia majus there is evidence of healing vesicular lesions c/w healing herpetic lesions. Normal appearing vaginal mucosa and cervix.  Normal discharge.   Pt declined replacement of pessary due to discomfort.     Assessment & Plan:  Diagnoses and all orders for this visit:  Herpetic lesion  Cont antiviral suppression therapy.    F/u in 1 month for pessary replacement   Lyra L. Harraway-Smith, M.D., [place]

## 2021-11-24 ENCOUNTER — Telehealth: Payer: Self-pay

## 2021-11-24 NOTE — Telephone Encounter (Signed)
LVM per DPR- Per Dr. Vanetta Shawl note regarding normal labs. Routed to PCP

## 2021-12-07 ENCOUNTER — Encounter: Payer: Self-pay | Admitting: Obstetrics & Gynecology

## 2021-12-14 ENCOUNTER — Ambulatory Visit (HOSPITAL_BASED_OUTPATIENT_CLINIC_OR_DEPARTMENT_OTHER)
Admission: RE | Admit: 2021-12-14 | Discharge: 2021-12-14 | Disposition: A | Discharge status: Home / Self Care | Payer: Medicare Other | Source: Ambulatory Visit | Attending: Cardiology | Admitting: Cardiology

## 2021-12-14 DIAGNOSIS — R0609 Other forms of dyspnea: Secondary | ICD-10-CM | POA: Diagnosis not present

## 2021-12-14 DIAGNOSIS — R0989 Other specified symptoms and signs involving the circulatory and respiratory systems: Secondary | ICD-10-CM | POA: Diagnosis not present

## 2021-12-14 DIAGNOSIS — I6523 Occlusion and stenosis of bilateral carotid arteries: Secondary | ICD-10-CM | POA: Diagnosis not present

## 2021-12-14 LAB — ECHOCARDIOGRAM COMPLETE
AR max vel: 1.48 cm2
AV Area VTI: 1.33 cm2
AV Area mean vel: 1.29 cm2
AV Mean grad: 12.7 mmHg
AV Peak grad: 21 mmHg
Ao pk vel: 2.29 m/s
Area-P 1/2: 4.26 cm2
S' Lateral: 2.4 cm

## 2021-12-14 NOTE — Progress Notes (Signed)
  Echocardiogram 2D Echocardiogram has been performed.  Roosvelt Maser F 12/14/2021, 11:50 AM

## 2021-12-15 ENCOUNTER — Other Ambulatory Visit (HOSPITAL_BASED_OUTPATIENT_CLINIC_OR_DEPARTMENT_OTHER): Payer: Medicare Other

## 2021-12-19 ENCOUNTER — Encounter: Payer: Self-pay | Admitting: Cardiology

## 2021-12-19 ENCOUNTER — Other Ambulatory Visit: Payer: Self-pay | Admitting: Family Medicine

## 2021-12-19 DIAGNOSIS — E034 Atrophy of thyroid (acquired): Secondary | ICD-10-CM

## 2021-12-21 ENCOUNTER — Encounter: Payer: Self-pay | Admitting: Obstetrics & Gynecology

## 2021-12-21 ENCOUNTER — Ambulatory Visit (INDEPENDENT_AMBULATORY_CARE_PROVIDER_SITE_OTHER): Payer: Medicare Other | Admitting: Obstetrics & Gynecology

## 2021-12-21 ENCOUNTER — Other Ambulatory Visit: Payer: Self-pay

## 2021-12-21 VITALS — BP 115/77 | HR 60 | Wt 165.0 lb

## 2021-12-21 DIAGNOSIS — R102 Pelvic and perineal pain: Secondary | ICD-10-CM

## 2021-12-21 DIAGNOSIS — N905 Atrophy of vulva: Secondary | ICD-10-CM

## 2021-12-21 DIAGNOSIS — L280 Lichen simplex chronicus: Secondary | ICD-10-CM | POA: Diagnosis not present

## 2021-12-21 DIAGNOSIS — B009 Herpesviral infection, unspecified: Secondary | ICD-10-CM

## 2021-12-21 MED ORDER — ACYCLOVIR 400 MG PO TABS: 400.0000 mg | ORAL_TABLET | Freq: Two times a day (BID) | ORAL | 0 refills | Status: DC

## 2021-12-21 NOTE — Progress Notes (Signed)
History:  71 y.o. G2P1011 here today for eval of vulvar discomfort and pelvic organ prolapse. Pt reports that her vulva is uncomfortable. She is not using the EES as it caused itching. She has stopped the coconut oil because it summer and it melts. She uses the clobetasol 2x/week. Pt denies dysuria.     The following portions of the patient's history were reviewed and updated as appropriate: allergies, current medications, past family history, past medical history, past social history, past surgical history and problem list.  Review of Systems:  Pertinent items are noted in HPI.    Objective:  Physical Exam Blood pressure 115/77, pulse 60, weight 165 lb (74.8 kg).  CONSTITUTIONAL: Well-developed, well-nourished female in no acute distress.  HENT:  Normocephalic, atraumatic EYES: Conjunctivae and EOM are normal. No scleral icterus.  NECK: Normal range of motion SKIN: Skin is warm and dry. No rash noted. Not diaphoretic.No pallor. NEUROLGIC: Alert and oriented to person, place, and time. Normal coordination.  Pelvic: Normal appearing age appropriate external genitalia; no lesions noted. There is atrophy. Normal discharge.  The pessary was removed, cleaned and replaced.  The vaginal mucosa is healthy.    Assessment & Plan:  Vaginal pain- suspect this is from atrophy.   Reviewed options. Pt will use the coconut oil again.    Pessary management  F/u in 3 months or sooner prn   Germani L. Harraway-Smith, M.D., [place]

## 2021-12-26 NOTE — Patient Instructions (Signed)
It was great to see you again today, please be sure to get your flu shot this fall Assuming all is well we can check back in about 6 months

## 2021-12-26 NOTE — Progress Notes (Unsigned)
[facility] at [facility] [address] [place], [place] [address] [phone]  Date:  12/28/2021   Name:  [name]   DOB:  [birth year]   MRN:  [id number]  PCP:  Pearline Cables, MD    Chief Complaint: No chief complaint on file.   History of Present Illness:  [name] is a 71 y.o. very pleasant female patient who presents with the following:  Patient seen today for follow-up-  history of NSTEMI and CABG in 2019, hypothyroidism, dyslipidemia, GERD, borderline prediabetes Most recent visit with myself about 1 year ago  She was seen by her cardiologist, Dr. Kirtland Bouchard in July: Coronary artery disease status post coronary bypass graft asymptomatic from that point review on appropriate medication which include antiplatelets therapy and high intense statin which I will continue denies have any chest pain tightness squeezing pressure burning chest Peripheral vascular disease: We will do carotic ultrasounds make sure she does not have any new activation of the problem.  Does not have any symptoms suggesting that Prediabetes is being followed by internal medicine team I did review K PN which show me her hemoglobin A1c of 5.8 this is from November of last year. Dyslipidemia data reviewed from May of last year with LDL of 78 HDL 62 we will repeat her fasting lipid profile.  She has also been seen by gynecology, Dr. Erin Fulling for pessary maintenance and HSV suppression Negative Cologuard completed in March Lipid panel is up-to-date, otherwise she needs labs  Acyclovir Aspirin 81 Lipitor Zetia Levothyroxine 88- she takes this 6 days a week Metoprolol 25-half tablet twice daily Omeprazole  ?  If chronic GERD symptoms may want to have her see gastroenterology At this time she does not wish to see GI; she notes her sx are under ok control with omeprazole. She is much better since she quit alcohol in the 90s I advised that chronic  GERD could cause precancerous or cancerous changes of the esophagus-she is still not interested in seeing gastroenterology at this time  She does not wish to be treated for osteoporosis or osteopenia and as such we will not pursue a DEXA scan She is using calcium and vitamin D and is getting weight bearing exercise   She is not fasting today   She has quite poor dentition, at this point is not interested in dentures.  She notes she is still able to chew soft foods and she does concentrate on protein intake Lab Results  Component Value Date   HGBA1C 5.8 03/16/2021    Patient Active Problem List   Diagnosis Date Noted   Arthritis    Chicken pox    Genital herpes    Hepatitis B    Hypothyroidism    Kidney stone    Pancreatitis    Prediabetes 03/11/2020   Peripheral vascular disease, unspecified (HCC) right subclavian artery stenosis 04/18/2019   Closed fracture of left wrist 12/13/2018   S/P CABG x 4 04/15/18 04/17/2018   NSTEMI (non-ST elevated myocardial infarction) (HCC) 04/15/2018   Bilateral carotid bruits    Systolic murmur    Pseudophakia of both eyes 02/18/2018   PVD (posterior vitreous detachment), bilateral 02/18/2018   Branch retinal vein occlusion of left eye with macular edema 01/09/2018   Osteopenia 11/30/2017   GERD (gastroesophageal reflux disease) 06/11/2016   Osteoarthritis 06/11/2016   Hypothyroidism (acquired) 06/11/2016   Dyslipidemia 06/11/2016   HSV-2 (herpes simplex virus 2) infection 06/11/2016  Diverticulosis of colon 05/16/2015    Past Medical History:  Diagnosis Date   Allergy 1979   Arthritis    Bilateral carotid bruits    1-39% bilateral ICA stenosis   Blood transfusion without reported diagnosis    Branch retinal vein occlusion of left eye with macular edema 01/09/2018   Cataract 2016   Bilateral cataract surgery 2017   Chicken pox    Closed fracture of left wrist 12/13/2018   Formatting of this note might be different from the  original. Added automatically from request for surgery [id number]   Diverticulosis of colon 05/16/2015   Dyslipidemia    Genital herpes    GERD (gastroesophageal reflux disease)    Hepatitis B    HSV-2 (herpes simplex virus 2) infection 06/11/2016   Hyperlipidemia 1974   On meds   Hypothyroidism    Hypothyroidism (acquired) 06/11/2016   Kidney stone    NSTEMI (non-ST elevated myocardial infarction) (HCC) 04/15/2018   Troponin 0.58 on 04/15/18   Osteoarthritis 06/11/2016   Osteopenia 11/30/2017   Pancreatitis    Peripheral vascular disease, unspecified (HCC) right subclavian artery stenosis 04/18/2019   Prediabetes 03/11/2020   Pseudophakia of both eyes 02/18/2018   PVD (posterior vitreous detachment), bilateral 02/18/2018   S/P CABG x 4 04/15/18 04/17/2018   LIMA to LAD SVG to DIAGONAL SVG to OM SVG to RCA   Substance abuse (HCC) 1964   Clean and sober since 07/29/1989   Systolic murmur     Past Surgical History:  Procedure Laterality Date   APPENDECTOMY  1972   CORONARY ARTERY BYPASS GRAFT N/A 04/17/2018   Procedure: CORONARY ARTERY BYPASS GRAFTING (CABG) TIMES USING LEFT INTERNAL MAMMARY ARTERY TO LAD AND SAPHENOUS VEIN TO DIAG 1, OM1, AND RCA;  Surgeon: Delight Ovens, MD;  Location: [facility] OR;  Service: Open Heart Surgery;  Laterality: N/A;   ENDOVEIN HARVEST OF GREATER SAPHENOUS VEIN Right 04/17/2018   Procedure: ENDOVEIN HARVEST OF GREATER SAPHENOUS VEIN RIGHT THIGH AND CALF;  Surgeon: Delight Ovens, MD;  Location: [facility] OR;  Service: Open Heart Surgery;  Laterality: Right;   EYE SURGERY  11/2015   Bilateral cataract surgery   FRACTURE SURGERY  R clavicle fx 1989?   Repair failed   LAPAROSCOPY     LEFT HEART CATH AND CORONARY ANGIOGRAPHY N/A 04/16/2018   Procedure: LEFT HEART CATH AND CORONARY ANGIOGRAPHY;  Surgeon: Kathleene Hazel, MD;  Location: [facility] INVASIVE CV LAB;  Service: Cardiovascular;  Laterality: N/A;   OVARIAN CYST SURGERY     TEE WITHOUT CARDIOVERSION  N/A 04/17/2018   Procedure: TRANSESOPHAGEAL ECHOCARDIOGRAM (TEE);  Surgeon: Delight Ovens, MD;  Location: [facility] OR;  Service: Open Heart Surgery;  Laterality: N/A;   TONSILLECTOMY     WRIST SURGERY Left 11/2018   Plate placement.    Social History   Tobacco Use   Smoking status: Former    Packs/day: 3.00    Years: 28.00    Total pack years: 84.00    Types: Cigarettes    Quit date: 04/22/1991    Years since quitting: 30.7   Smokeless tobacco: Never  Vaping Use   Vaping Use: Never used  Substance Use Topics   Alcohol use: Not Currently    Comment: Last drink 07/1989   Drug use: Not Currently    Types: Amphetamines, Barbituates, Benzodiazepines, "Crack" cocaine, Cocaine, Heroin, LSD, Methaqualone, Psilocybin    Family History  Problem Relation Age of Onset   Alcoholism Mother    Kidney disease  Mother    Kidney Stones Mother    Liver cancer Mother    Cancer Mother    Vision loss Mother    Alcoholism Father    Hyperlipidemia Father    Heart disease Father    Heart failure Father    Alcohol abuse Father    Arthritis Father    Alcoholism Maternal Grandfather    Hearing loss Brother    Heart disease Brother    Hyperlipidemia Brother    Hypertension Brother     Allergies  Allergen Reactions   Dye Fdc Blue  [Fd&C Blue #1 (Brilliant Blue Fcf)] Anaphylaxis    Dye used for CAT scans   Contrast Media [Iodinated Contrast Media]    Scopolamine Anxiety and Other (See Comments)    Medication list has been reviewed and updated.  Current Outpatient Medications on File Prior to Visit  Medication Sig Dispense Refill   acetaminophen (TYLENOL) 500 MG tablet Take 500 mg by mouth every 6 (six) hours as needed.     acyclovir (ZOVIRAX) 400 MG tablet Take 1 tablet (400 mg total) by mouth 2 (two) times daily. 180 tablet 0   aspirin EC 81 MG EC tablet Take 1 tablet (81 mg total) by mouth daily.     atorvastatin (LIPITOR) 80 MG tablet Take 1 tablet (80 mg total) by mouth daily. 90  tablet 3   cholecalciferol (VITAMIN D3) 25 MCG (1000 UNIT) tablet Take 2,000 Units by mouth daily.     clobetasol cream (TEMOVATE) 0.05 % Apply 1 application topically 2 (two) times daily. Apply to affected area twice a day 30 g 2   ezetimibe (ZETIA) 10 MG tablet Take 1 tablet (10 mg total) by mouth daily. 90 tablet 0   levothyroxine (SYNTHROID) 88 MCG tablet Take 1 tablet (88 mcg total) by mouth daily before breakfast. 30 tablet 0   metoprolol tartrate (LOPRESSOR) 25 MG tablet Take 1/2 (one-half) tablet by mouth twice daily (Patient taking differently: Take 12.5 mg by mouth 2 (two) times daily. Take 1/2 (one-half) tablet by mouth twice daily) 180 tablet 3   omeprazole (PRILOSEC) 40 MG capsule Take 1 capsule (40 mg total) by mouth daily. 90 capsule 3   No current facility-administered medications on file prior to visit.    Review of Systems:  As per HPI- otherwise negative.   Physical Examination: Vitals:   12/28/21 1114 12/28/21 1120  BP: (!) 142/65 (!) 117/53  Pulse: 63   Resp: 16   Temp: 98.2 F (36.8 C)   SpO2: 98%    Vitals:   12/28/21 1114  Weight: 168 lb (76.2 kg)   Body mass index is 32.81 kg/m. Ideal Body Weight:    GEN: no acute distress.  Mildly obese, looks well HEENT: Atraumatic, Normocephalic.  She has only a few broken teeth in the lower mandible, otherwise teeth are mostly missing Bilateral TM wnl, oropharynx normal except for dentition.  PEERL,EOMI.   Ears and Nose: No external deformity. CV: RRR, No M/G/R. No JVD. No thrill. No extra heart sounds. PULM: CTA B, no wheezes, crackles, rhonchi. No retractions. No resp. distress. No accessory muscle use. ABD: S, NT, ND EXTR: No c/c/e PSYCH: Normally interactive. Conversant.    Assessment and Plan: Hypothyroidism due to acquired atrophy of thyroid - Plan: TSH  Prediabetes - Plan: Comprehensive metabolic panel, Hemoglobin A1c  Gastroesophageal reflux disease, unspecified whether esophagitis  present  Hyperlipidemia, unspecified hyperlipidemia type  Screening for deficiency anemia - Plan: CBC  Medication monitoring encounter -  Plan: CBC  Poor dentition  Following up today.  Discussed her dentition, with the time being she is not interested in pursuing further care Continue PPI for chronic GERD, she declines bone density or GI consultation Follow-up on thyroid, A1c, CBC as above Will plan further follow- up pending labs.   Signed Abbe Amsterdam, MD  Received labs as below, message to patient Results for orders placed or performed in visit on 12/28/21  CBC  Result Value Ref Range   WBC 7.1 4.0 - 10.5 K/uL   RBC 3.88 3.87 - 5.11 Mil/uL   Platelets 287.0 150.0 - 400.0 K/uL   Hemoglobin 13.3 12.0 - 15.0 g/dL   HCT 10.9 32.3 - 55.7 %   MCV 101.2 (H) 78.0 - 100.0 fl   MCHC 33.7 30.0 - 36.0 g/dL   RDW 32.2 02.5 - 42.7 %  Comprehensive metabolic panel  Result Value Ref Range   Sodium 135 135 - 145 mEq/L   Potassium 3.7 3.5 - 5.1 mEq/L   Chloride 99 96 - 112 mEq/L   CO2 29 19 - 32 mEq/L   Glucose, Bld 94 70 - 99 mg/dL   BUN 14 6 - 23 mg/dL   Creatinine, Ser 0.62 0.40 - 1.20 mg/dL   Total Bilirubin 0.4 0.2 - 1.2 mg/dL   Alkaline Phosphatase 75 39 - 117 U/L   AST 21 0 - 37 U/L   ALT 17 0 - 35 U/L   Total Protein 6.4 6.0 - 8.3 g/dL   Albumin 4.0 3.5 - 5.2 g/dL   GFR 37.62 >83.15 mL/min   Calcium 9.1 8.4 - 10.5 mg/dL  Hemoglobin V7O  Result Value Ref Range   Hgb A1c MFr Bld 6.0 4.6 - 6.5 %  TSH  Result Value Ref Range   TSH 5.88 (H) 0.35 - 5.50 uIU/mL

## 2021-12-28 ENCOUNTER — Ambulatory Visit (INDEPENDENT_AMBULATORY_CARE_PROVIDER_SITE_OTHER): Payer: Medicare Other | Admitting: Family Medicine

## 2021-12-28 ENCOUNTER — Encounter: Payer: Self-pay | Admitting: Family Medicine

## 2021-12-28 VITALS — BP 117/53 | HR 63 | Temp 98.2°F | Resp 16 | Wt 168.0 lb

## 2021-12-28 DIAGNOSIS — R7303 Prediabetes: Secondary | ICD-10-CM | POA: Diagnosis not present

## 2021-12-28 DIAGNOSIS — Z130 Encounter for screening for diseases of the blood and blood-forming organs and certain disorders involving the immune mechanism: Secondary | ICD-10-CM

## 2021-12-28 DIAGNOSIS — Z5181 Encounter for therapeutic drug level monitoring: Secondary | ICD-10-CM | POA: Diagnosis not present

## 2021-12-28 DIAGNOSIS — K219 Gastro-esophageal reflux disease without esophagitis: Secondary | ICD-10-CM | POA: Diagnosis not present

## 2021-12-28 DIAGNOSIS — E034 Atrophy of thyroid (acquired): Secondary | ICD-10-CM | POA: Diagnosis not present

## 2021-12-28 DIAGNOSIS — K089 Disorder of teeth and supporting structures, unspecified: Secondary | ICD-10-CM

## 2021-12-28 DIAGNOSIS — E785 Hyperlipidemia, unspecified: Secondary | ICD-10-CM

## 2021-12-28 DIAGNOSIS — G8929 Other chronic pain: Secondary | ICD-10-CM

## 2021-12-28 LAB — CBC
HCT: 39.3 % (ref 36.0–46.0)
Hemoglobin: 13.3 g/dL (ref 12.0–15.0)
MCHC: 33.7 g/dL (ref 30.0–36.0)
MCV: 101.2 fl — ABNORMAL HIGH (ref 78.0–100.0)
Platelets: 287 10*3/uL (ref 150.0–400.0)
RBC: 3.88 Mil/uL (ref 3.87–5.11)
RDW: 11.9 % (ref 11.5–15.5)
WBC: 7.1 10*3/uL (ref 4.0–10.5)

## 2021-12-28 LAB — COMPREHENSIVE METABOLIC PANEL
ALT: 17 U/L (ref 0–35)
AST: 21 U/L (ref 0–37)
Albumin: 4 g/dL (ref 3.5–5.2)
Alkaline Phosphatase: 75 U/L (ref 39–117)
BUN: 14 mg/dL (ref 6–23)
CO2: 29 mEq/L (ref 19–32)
Calcium: 9.1 mg/dL (ref 8.4–10.5)
Chloride: 99 mEq/L (ref 96–112)
Creatinine, Ser: 0.68 mg/dL (ref 0.40–1.20)
GFR: 86.45 mL/min (ref >60.00)
Glucose, Bld: 94 mg/dL (ref 70–99)
Potassium: 3.7 mEq/L (ref 3.5–5.1)
Sodium: 135 mEq/L (ref 135–145)
Total Bilirubin: 0.4 mg/dL (ref 0.2–1.2)
Total Protein: 6.4 g/dL (ref 6.0–8.3)

## 2021-12-28 LAB — TSH: TSH: 5.88 u[IU]/mL — ABNORMAL HIGH (ref 0.35–5.50)

## 2021-12-28 LAB — HEMOGLOBIN A1C: Hgb A1c MFr Bld: 6 % (ref 4.6–6.5)

## 2021-12-28 NOTE — Addendum Note (Signed)
Addended by: Abbe Amsterdam C on: 12/28/2021 05:39 PM   Modules accepted: Orders

## 2022-01-09 ENCOUNTER — Other Ambulatory Visit: Payer: Self-pay | Admitting: Cardiology

## 2022-01-09 MED ORDER — EZETIMIBE 10 MG PO TABS: 10.0000 mg | ORAL_TABLET | Freq: Every day | ORAL | 3 refills | Status: DC

## 2022-01-09 NOTE — Telephone Encounter (Signed)
Rx refill sent to pharmacy. 

## 2022-01-30 DIAGNOSIS — H02831 Dermatochalasis of right upper eyelid: Secondary | ICD-10-CM | POA: Diagnosis not present

## 2022-01-30 DIAGNOSIS — H34832 Tributary (branch) retinal vein occlusion, left eye, with macular edema: Secondary | ICD-10-CM | POA: Diagnosis not present

## 2022-01-30 DIAGNOSIS — H524 Presbyopia: Secondary | ICD-10-CM | POA: Diagnosis not present

## 2022-01-30 DIAGNOSIS — Z961 Presence of intraocular lens: Secondary | ICD-10-CM | POA: Diagnosis not present

## 2022-01-30 DIAGNOSIS — H43813 Vitreous degeneration, bilateral: Secondary | ICD-10-CM | POA: Diagnosis not present

## 2022-01-30 DIAGNOSIS — H02834 Dermatochalasis of left upper eyelid: Secondary | ICD-10-CM | POA: Diagnosis not present

## 2022-02-02 ENCOUNTER — Other Ambulatory Visit (HOSPITAL_BASED_OUTPATIENT_CLINIC_OR_DEPARTMENT_OTHER): Payer: Self-pay

## 2022-02-02 DIAGNOSIS — Z23 Encounter for immunization: Secondary | ICD-10-CM | POA: Diagnosis not present

## 2022-02-02 MED ORDER — INFLUENZA VAC A&B SA ADJ QUAD 0.5 ML IM PRSY
PREFILLED_SYRINGE | INTRAMUSCULAR | 0 refills | Status: DC
  Filled 2022-02-02: qty 0.5, 1d supply, fill #0

## 2022-02-07 ENCOUNTER — Other Ambulatory Visit (INDEPENDENT_AMBULATORY_CARE_PROVIDER_SITE_OTHER): Payer: Medicare Other

## 2022-02-07 ENCOUNTER — Encounter: Payer: Self-pay | Admitting: Family Medicine

## 2022-02-07 DIAGNOSIS — E034 Atrophy of thyroid (acquired): Secondary | ICD-10-CM

## 2022-02-07 LAB — TSH: TSH: 4.01 u[IU]/mL (ref 0.35–5.50)

## 2022-02-14 ENCOUNTER — Other Ambulatory Visit: Payer: Self-pay | Admitting: Family Medicine

## 2022-02-14 DIAGNOSIS — E034 Atrophy of thyroid (acquired): Secondary | ICD-10-CM

## 2022-02-15 DIAGNOSIS — H3589 Other specified retinal disorders: Secondary | ICD-10-CM | POA: Diagnosis not present

## 2022-02-15 DIAGNOSIS — H43813 Vitreous degeneration, bilateral: Secondary | ICD-10-CM | POA: Diagnosis not present

## 2022-02-15 DIAGNOSIS — Z961 Presence of intraocular lens: Secondary | ICD-10-CM | POA: Diagnosis not present

## 2022-02-15 DIAGNOSIS — H34832 Tributary (branch) retinal vein occlusion, left eye, with macular edema: Secondary | ICD-10-CM | POA: Diagnosis not present

## 2022-03-01 DIAGNOSIS — H348321 Tributary (branch) retinal vein occlusion, left eye, with retinal neovascularization: Secondary | ICD-10-CM | POA: Diagnosis not present

## 2022-03-06 ENCOUNTER — Other Ambulatory Visit (HOSPITAL_BASED_OUTPATIENT_CLINIC_OR_DEPARTMENT_OTHER): Payer: Self-pay

## 2022-03-06 DIAGNOSIS — H35352 Cystoid macular degeneration, left eye: Secondary | ICD-10-CM | POA: Diagnosis not present

## 2022-03-06 DIAGNOSIS — H348321 Tributary (branch) retinal vein occlusion, left eye, with retinal neovascularization: Secondary | ICD-10-CM | POA: Diagnosis not present

## 2022-03-06 DIAGNOSIS — Z23 Encounter for immunization: Secondary | ICD-10-CM | POA: Diagnosis not present

## 2022-03-06 MED ORDER — COMIRNATY 30 MCG/0.3ML IM SUSY
PREFILLED_SYRINGE | INTRAMUSCULAR | 0 refills | Status: DC
  Filled 2022-03-06: qty 0.3, 1d supply, fill #0

## 2022-03-07 ENCOUNTER — Other Ambulatory Visit: Payer: Self-pay | Admitting: Cardiology

## 2022-03-10 ENCOUNTER — Encounter: Payer: Self-pay | Admitting: Family Medicine

## 2022-03-10 DIAGNOSIS — B009 Herpesviral infection, unspecified: Secondary | ICD-10-CM

## 2022-03-10 MED ORDER — ACYCLOVIR 400 MG PO TABS: 400.0000 mg | ORAL_TABLET | Freq: Two times a day (BID) | ORAL | 3 refills | Status: DC

## 2022-03-10 NOTE — Telephone Encounter (Signed)
Are you okay with filling until she sees Gyn?

## 2022-03-11 MED ORDER — ACYCLOVIR 400 MG PO TABS: 400.0000 mg | ORAL_TABLET | Freq: Two times a day (BID) | ORAL | 3 refills | Status: DC

## 2022-03-11 NOTE — Addendum Note (Signed)
Addended by: Lamar Blinks C on: 03/11/2022 07:05 AM   Modules accepted: Orders

## 2022-03-15 ENCOUNTER — Ambulatory Visit (INDEPENDENT_AMBULATORY_CARE_PROVIDER_SITE_OTHER): Payer: Medicare Other | Admitting: Obstetrics & Gynecology

## 2022-03-15 ENCOUNTER — Encounter: Payer: Self-pay | Admitting: Obstetrics & Gynecology

## 2022-03-15 VITALS — BP 133/71 | HR 63

## 2022-03-15 DIAGNOSIS — N814 Uterovaginal prolapse, unspecified: Secondary | ICD-10-CM

## 2022-03-15 DIAGNOSIS — N939 Abnormal uterine and vaginal bleeding, unspecified: Secondary | ICD-10-CM | POA: Diagnosis not present

## 2022-03-15 DIAGNOSIS — N958 Other specified menopausal and perimenopausal disorders: Secondary | ICD-10-CM

## 2022-03-15 NOTE — Progress Notes (Signed)
History:  71 y.o. G2P1011 here today for eval of pelvic organ prolapse. Pt reports that she had been bleeding since her last appointment. She reports taht she is having to change out her pads on some days.  She has also been periodically using topical EES.       The following portions of the patient's history were reviewed and updated as appropriate: allergies, current medications, past family history, past medical history, past social history, past surgical history and problem list.   Review of Systems:  Pertinent items are noted in HPI.    Objective:  Physical Exam Blood pressure 115/77, pulse 60, weight 165 lb (74.8 kg).   CONSTITUTIONAL: Well-developed, well-nourished female in no acute distress.  HENT:  Normocephalic, atraumatic EYES: Conjunctivae and EOM are normal. No scleral icterus.  NECK: Normal range of motion SKIN: Skin is warm and dry. No rash noted. Not diaphoretic.No pallor. [place]: Alert and oriented to person, place, and time. Normal coordination.  Pelvic: Normal appearing age appropriate external genitalia; no lesions noted. There is atrophy. The pessary was not seated properly. The knob of the pessary was at the introitus. There is bloody discharge.The pessary was removed.  Posterior to the cervix in the upper vagina there appears to be a laceration however, it began to bleed and I could not fully eval without discomfort to the pt. The vaginal mucosa appeared macerated in the upper portion.    Assessment & Plan:              Pessary management Pt with a h/o atrophy. I believe that the bleeding is related to a laceration however, I could not adequately visualize. I have asked the pt to withhold use of the pessary for 1 week and return for a repeat exam.   She will also need to be reeval for another pessary at another visit.  F/u in 1 week    Diagnoses and all orders for this visit:  Uterine prolapse  Vaginal bleeding  Genitourinary syndrome of menopause  Hold  topical EES and Clobetasol at present.      Syanne L. Harraway-Smith, M.D., Cherlynn June

## 2022-03-21 ENCOUNTER — Encounter: Payer: Self-pay | Admitting: Obstetrics & Gynecology

## 2022-03-22 ENCOUNTER — Ambulatory Visit (INDEPENDENT_AMBULATORY_CARE_PROVIDER_SITE_OTHER): Payer: Medicare Other | Admitting: Obstetrics & Gynecology

## 2022-03-22 ENCOUNTER — Ambulatory Visit: Payer: Medicare Other | Admitting: Obstetrics & Gynecology

## 2022-03-22 ENCOUNTER — Encounter: Payer: Self-pay | Admitting: Obstetrics & Gynecology

## 2022-03-22 VITALS — BP 127/60 | HR 64 | Wt 164.0 lb

## 2022-03-22 DIAGNOSIS — N813 Complete uterovaginal prolapse: Secondary | ICD-10-CM

## 2022-03-22 NOTE — Progress Notes (Signed)
History:  71 y.o. G2P1011 here today for eval of pelvic organ prolapse. Pt reports the prev bleeding she had has completely resolved with the pessary out.        The following portions of the patient's history were reviewed and updated as appropriate: allergies, current medications, past family history, past medical history, past social history, past surgical history and problem list.   Review of Systems:  Pertinent items are noted in HPI.    Objective:  Physical Exam BP 127/60   Pulse 64   Wt 164 lb (74.4 kg)   BMI 32.03 kg/m     CONSTITUTIONAL: Well-developed, well-nourished female in no acute distress.  HENT:  Normocephalic, atraumatic EYES: Conjunctivae and EOM are normal. No scleral icterus.  NECK: Normal range of motion SKIN: Skin is warm and dry. No rash noted. Not diaphoretic.No pallor. NEUROLGIC: Alert and oriented to person, place, and time. Normal coordination.  Pelvic: Normal appearing age appropriate external genitalia; no lesions noted. There is atrophy. The pessary remains out. The vaginal mucosa is healed well. There are no excoriations. There is no blood in the vualt or active bleeding.  Assessment & Plan:              Pessary management with prev bleeding. The bleeding is resolved and the lacerations have healed.  D/w pt long term plan. She is amenable to surgery as I explained that it should be outpt. She will go to referral with Dr. Florian Buff. The pessary was left out .    Diagnoses and all orders for this visit:   Uterine prolapse- pt has tried multiple differnet pessaries of various sizes. She had not had great success. I have readdressed having surgery and the pt is now open to the idea.  Referral to Dr. Florian Buff   Vaginal bleeding- resolved   Genitourinary syndrome of menopause  F/u in 2 months or sooner prn    May use topical EES   Total face-to-face time with patient, review of chart, discussion with consultant and coordination of care was .      Averill L. Harraway-Smith, M.D., Evern Core

## 2022-03-29 DIAGNOSIS — H43813 Vitreous degeneration, bilateral: Secondary | ICD-10-CM | POA: Diagnosis not present

## 2022-03-29 DIAGNOSIS — H35372 Puckering of macula, left eye: Secondary | ICD-10-CM | POA: Diagnosis not present

## 2022-04-26 ENCOUNTER — Other Ambulatory Visit: Payer: Self-pay

## 2022-04-26 ENCOUNTER — Encounter: Payer: Self-pay | Admitting: Cardiology

## 2022-04-26 MED ORDER — ATORVASTATIN CALCIUM 80 MG PO TABS: 80.0000 mg | ORAL_TABLET | Freq: Every day | ORAL | 3 refills | Status: DC

## 2022-04-26 NOTE — Telephone Encounter (Signed)
Lipitor 80mg  #90 ref x 3 sent to [facility] per pt request

## 2022-05-03 DIAGNOSIS — H3589 Other specified retinal disorders: Secondary | ICD-10-CM | POA: Diagnosis not present

## 2022-05-03 DIAGNOSIS — Z961 Presence of intraocular lens: Secondary | ICD-10-CM | POA: Diagnosis not present

## 2022-05-03 DIAGNOSIS — H43813 Vitreous degeneration, bilateral: Secondary | ICD-10-CM | POA: Diagnosis not present

## 2022-05-03 DIAGNOSIS — H35372 Puckering of macula, left eye: Secondary | ICD-10-CM | POA: Diagnosis not present

## 2022-05-17 ENCOUNTER — Ambulatory Visit (INDEPENDENT_AMBULATORY_CARE_PROVIDER_SITE_OTHER): Payer: Medicare Other | Admitting: Obstetrics & Gynecology

## 2022-05-17 ENCOUNTER — Encounter: Payer: Self-pay | Admitting: Obstetrics & Gynecology

## 2022-05-17 VITALS — BP 115/51 | HR 65 | Wt 166.0 lb

## 2022-05-17 DIAGNOSIS — N814 Uterovaginal prolapse, unspecified: Secondary | ICD-10-CM | POA: Diagnosis not present

## 2022-05-17 DIAGNOSIS — N958 Other specified menopausal and perimenopausal disorders: Secondary | ICD-10-CM | POA: Diagnosis not present

## 2022-05-19 ENCOUNTER — Telehealth: Payer: Self-pay | Admitting: General Practice

## 2022-05-19 NOTE — Telephone Encounter (Signed)
Left message on VM informing patient that UROGYN was trying to reach her on 05/17/2022 to schedule new pt appt.  Number given for her to contact that office to schedule.

## 2022-05-26 ENCOUNTER — Encounter: Payer: Self-pay | Admitting: Obstetrics & Gynecology

## 2022-05-26 NOTE — Progress Notes (Signed)
History:  72 y.o. G2P1011 here today for eval of pelvic organ prolapse. Pt reports no bleeding since her last visit. Her pessary has been out and she plans to leave it out. She still feels the proplapsed tissue but, reports that this is preferable to the bleeding and the pessary issues. She does not want ot try fitting another pessary. She cont to periodically use topical EES.       The following portions of the patient's history were reviewed and updated as appropriate: allergies, current medications, past family history, past medical history, past social history, past surgical history and problem list.   Review of Systems:  Pertinent items are noted in HPI.    Objective:  Physical Exam Blood pressure 115/77, pulse 60, weight 165 lb (74.8 kg).   CONSTITUTIONAL: Well-developed, well-nourished female in no acute distress.  HENT:  Normocephalic, atraumatic EYES: Conjunctivae and EOM are normal. No scleral icterus.  NECK: Normal range of motion SKIN: Skin is warm and dry. No rash noted. Not diaphoretic.No pallor. [place]: Alert and oriented to person, place, and time. Normal coordination.  Pelvic: Normal appearing age appropriate external genitalia; no lesions noted. There is atrophy. The pessary is not in. The tissue has completely healed with no residual lesions noted.    Assessment & Plan:               Uterine prolapse- Pt is still interested in seeing UroGYN but, has not heard from them and feels like this will be a long delay. She is not in a rush.     Vaginal bleeding-resolved    Genitourinary syndrome of menopause   May use topical EES and Clobetasol as needed.      Total face-to-face time with patient, review of chart, discussion with consultant and coordination of care was 104min.      Hollynn L. Harraway-Smith, M.D., [place]. Harraway-Smith, M.D., Cherlynn June

## 2022-05-29 ENCOUNTER — Other Ambulatory Visit: Payer: Self-pay

## 2022-05-29 DIAGNOSIS — R102 Pelvic and perineal pain: Secondary | ICD-10-CM

## 2022-05-29 DIAGNOSIS — N958 Other specified menopausal and perimenopausal disorders: Secondary | ICD-10-CM

## 2022-05-29 DIAGNOSIS — L280 Lichen simplex chronicus: Secondary | ICD-10-CM

## 2022-05-29 MED ORDER — CLOBETASOL PROPIONATE 0.05 % EX CREA: 1.0000 | TOPICAL_CREAM | Freq: Two times a day (BID) | CUTANEOUS | 2 refills | Status: DC

## 2022-06-08 ENCOUNTER — Other Ambulatory Visit (HOSPITAL_COMMUNITY): Payer: Self-pay

## 2022-06-24 ENCOUNTER — Encounter: Payer: Self-pay | Admitting: Cardiology

## 2022-07-03 ENCOUNTER — Encounter: Payer: Self-pay | Admitting: *Deleted

## 2022-07-19 ENCOUNTER — Other Ambulatory Visit (HOSPITAL_COMMUNITY)
Admission: RE | Admit: 2022-07-19 | Discharge: 2022-07-19 | Disposition: A | Discharge status: Home / Self Care | Payer: Medicare Other | Attending: Obstetrics and Gynecology | Admitting: Obstetrics and Gynecology

## 2022-07-19 ENCOUNTER — Encounter: Payer: Self-pay | Admitting: Obstetrics and Gynecology

## 2022-07-19 ENCOUNTER — Ambulatory Visit (INDEPENDENT_AMBULATORY_CARE_PROVIDER_SITE_OTHER): Payer: Medicare Other | Admitting: Obstetrics and Gynecology

## 2022-07-19 VITALS — BP 113/79 | HR 62 | Ht 58.66 in | Wt 167.0 lb

## 2022-07-19 DIAGNOSIS — R350 Frequency of micturition: Secondary | ICD-10-CM

## 2022-07-19 DIAGNOSIS — N952 Postmenopausal atrophic vaginitis: Secondary | ICD-10-CM | POA: Diagnosis not present

## 2022-07-19 DIAGNOSIS — N812 Incomplete uterovaginal prolapse: Secondary | ICD-10-CM | POA: Diagnosis not present

## 2022-07-19 DIAGNOSIS — N393 Stress incontinence (female) (male): Secondary | ICD-10-CM | POA: Diagnosis not present

## 2022-07-19 DIAGNOSIS — R82998 Other abnormal findings in urine: Secondary | ICD-10-CM | POA: Insufficient documentation

## 2022-07-19 DIAGNOSIS — N8110 Cystocele, unspecified: Secondary | ICD-10-CM

## 2022-07-19 LAB — POCT URINALYSIS DIPSTICK
Bilirubin, UA: NEGATIVE
Glucose, UA: NEGATIVE
Ketones, UA: NEGATIVE
Nitrite, UA: NEGATIVE
Protein, UA: NEGATIVE
Spec Grav, UA: 1.01 (ref 1.010–1.025)
Urobilinogen, UA: 0.2 E.U./dL
pH, UA: 6 (ref 5.0–8.0)

## 2022-07-19 MED ORDER — ESTRADIOL 0.1 MG/GM VA CREA: TOPICAL_CREAM | VAGINAL | 11 refills | Status: DC

## 2022-07-19 NOTE — Patient Instructions (Addendum)
You have a stage 3 (out of 4) prolapse.  We discussed the fact that it is not life threatening but there are several treatment options. For treatment of pelvic organ prolapse, we discussed options for management including expectant management, conservative management, and surgical management, such as Kegels, a pessary, pelvic floor physical therapy, and specific surgical procedures.     URODYNAMICS (UDS) TEST INFORMATION  IMPORTANT: Please try to arrive with a comfortably full bladder!    What is UDS? Urodynamics is a bladder test used to evaluate how your bladder and urethra (tube you urinate out of) work to help find out the cause of your bladder symptoms and evaluate your bladder function in order to make the best treatment plan for you.   What to expect? A nurse will perform the test and will be with you during the entire exam. First we will have to empty your bladder on a special toilet.  After you have emptied your bladder, very small catheters (plastic tubing) will be placed into your bladder and into your vagina (or rectum). These special small catheters measure pressure to help measure your bladder function.  Your bladder will be gently filled with water and you will be asked to cough and strain at several different points during the test.   You will then be asked to empty your bladder in the special toilet with the catheters in place. Most patients can urinate (pee) easily with the catheters in place since the catheters are so small. In total this procedure lasts about 45 minutes to 1 hour.  After your test is completed, you will return (or possibly be seen the same day) to review the results, talk about treatment options and make a plan moving forward.

## 2022-07-19 NOTE — Progress Notes (Signed)
[facility] New Patient Evaluation and Consultation  Referring Provider: Lavonia Drafts* PCP: Darreld Mclean, MD Date of Service: 07/19/2022  SUBJECTIVE Chief Complaint: New Patient (Initial Visit) [name] is a 72 y.o. female here for a consult for prolapse./Pt said she SUI and urinary urgency.)  History of Present Illness: [name] is a 72 y.o. White or Caucasian female seen in consultation at the request of Dr. Ihor Dow for evaluation of prolapse.    Review of records significant for: Has tried multiple pessaries and experienced vaginal bleeding despite estrogen use.   Urinary Symptoms: Leaks urine with cough/ sneeze, exercise, with a full bladder, with movement to the bathroom, and with urgency Leaks 2-3 time(s) per day. With the pessary in, the urine just dribbles.   Pad use: 2-3 liners/ mini-pads per day.   She is bothered by her UI symptoms.  Day time voids 6-8.  Nocturia: 3 times per night to void. Voiding dysfunction: she does not empty her bladder well.  does not use a catheter to empty bladder.  When urinating, she feels a weak stream, difficulty starting urine stream, and to push on her belly or vagina to empty bladder   UTIs: 1 UTI's in the last year.   Denies history of blood in urine and kidney or bladder stones  Pelvic Organ Prolapse Symptoms:                  She Admits to a feeling of a bulge the vaginal area. It has been present for 4 years.  She Admits to seeing a bulge.  This bulge is bothersome. Has previously tried a pessary but caused bleeding. She was using estrogen and coconut oil. Stopped using the estrogen because she it was expensive  Bowel Symptom: Bowel movements: 1 time(s) per day Stool consistency: soft , but sometimes round balls Straining: no.  Splinting: no.  Incomplete evacuation: yes.  She Denies accidental bowel leakage / fecal incontinence Bowel regimen: diet, fiber, and stool  softener   Sexual Function Sexually active: no.  Sexual orientation: Straight Pain with sex: No  Pelvic Pain Admits to pelvic pain Pain occurs: due to prolapse and herpes lesions Prior pain treatment: pessary Improved by: lying down Worsened by: standing She is taking acyclovir for HSV outbreaks She is using clobetasol twice a week.   Past Medical History:  Past Medical History:  Diagnosis Date   Allergy 1979   Arthritis    Bilateral carotid bruits    1-39% bilateral ICA stenosis   Blood transfusion without reported diagnosis    Branch retinal vein occlusion of left eye with macular edema 01/09/2018   Cataract 2016   Bilateral cataract surgery 2017   Chicken pox    Closed fracture of left wrist 12/13/2018   Formatting of this note might be different from the original. Added automatically from request for surgery [id number]   Diverticulosis of colon 05/16/2015   Dyslipidemia    Genital herpes    GERD (gastroesophageal reflux disease)    Hepatitis B    HSV-2 (herpes simplex virus 2) infection 06/11/2016   Hyperlipidemia 1974   On meds   Hypothyroidism    Hypothyroidism (acquired) 06/11/2016   Kidney stone    NSTEMI (non-ST elevated myocardial infarction) ([place]) 04/15/2018   Troponin 0.58 on 04/15/18   Osteoarthritis 06/11/2016   Osteopenia 11/30/2017   Pancreatitis    Peripheral vascular disease, unspecified ([place]) right subclavian artery stenosis 04/18/2019   Prediabetes 03/11/2020   Pseudophakia of both  eyes 02/18/2018   PVD (posterior vitreous detachment), bilateral 02/18/2018   S/P CABG x 4 04/15/18 04/17/2018   LIMA to LAD SVG to DIAGONAL SVG to OM SVG to RCA   Substance abuse ([place]) 1964   Clean and sober since [id number]   Systolic murmur      Past Surgical History:   Past Surgical History:  Procedure Laterality Date   APPENDECTOMY  1972   CORONARY ARTERY BYPASS GRAFT N/A 04/17/2018   Procedure: CORONARY ARTERY BYPASS GRAFTING (CABG) TIMES USING LEFT  INTERNAL MAMMARY ARTERY TO LAD AND SAPHENOUS VEIN TO DIAG 1, OM1, AND RCA;  Surgeon: Grace Isaac, MD;  Location: [place];  Service: Open Heart Surgery;  Laterality: N/A;   ENDOVEIN HARVEST OF GREATER SAPHENOUS VEIN Right 04/17/2018   Procedure: ENDOVEIN HARVEST OF GREATER SAPHENOUS VEIN RIGHT THIGH AND CALF;  Surgeon: Grace Isaac, MD;  Location: [place];  Service: Open Heart Surgery;  Laterality: Right;   EYE SURGERY  11/2015   Bilateral cataract surgery   FRACTURE SURGERY  R clavicle fx 1989?   Repair failed   LAPAROSCOPY     LEFT HEART CATH AND CORONARY ANGIOGRAPHY N/A 04/16/2018   Procedure: LEFT HEART CATH AND CORONARY ANGIOGRAPHY;  Surgeon: Burnell Blanks, MD;  Location: [place] [facility];  Service: Cardiovascular;  Laterality: N/A;   OVARIAN CYST SURGERY     TEE WITHOUT CARDIOVERSION N/A 04/17/2018   Procedure: TRANSESOPHAGEAL ECHOCARDIOGRAM (TEE);  Surgeon: Grace Isaac, MD;  Location: [place];  Service: Open Heart Surgery;  Laterality: N/A;   TONSILLECTOMY     WRIST SURGERY Left 11/2018   Plate placement.     Past OB/GYN History: OB History  Gravida Para Term Preterm AB Living  '2 1 1 '$ 0 1 1  SAB IAB Ectopic Multiple Live Births  0 1 0 0 1    # Outcome Date GA Lbr Len/2nd Weight Sex Delivery Anes PTL Lv  2 IAB      TAB     1 Term  [redacted]w[redacted]d      LIV    Menopausal: only had vaginal bleeding with pessary  Any history of abnormal pap smears: no.   Medications: She has a current medication list which includes the following prescription(s): acetaminophen, acyclovir, aspirin ec, atorvastatin, cholecalciferol, clobetasol cream, comirnaty, [START ON 07/20/2022] estradiol, ezetimibe, influenza vaccine adjuvanted, levothyroxine, metoprolol tartrate, and omeprazole.   Allergies: Patient is allergic to dye fdc blue  [fd&c blue #1 (brilliant blue)], contrast media [iodinated contrast media], povidone-iodine, and scopolamine.   Social History:  Social History    Tobacco Use   Smoking status: Former    Packs/day: 3.00    Years: 28.00    Total pack years: 84.00    Types: Cigarettes    Quit date: 04/22/1991    Years since quitting: 31.2   Smokeless tobacco: Never  Vaping Use   Vaping Use: Never used  Substance Use Topics   Alcohol use: Not Currently    Comment: Last drink 07/1989   Drug use: Not Currently    Types: Amphetamines, Barbituates, Benzodiazepines, "Crack" cocaine, Cocaine, Heroin, LSD, Methaqualone, Psilocybin    Relationship status: divorced She lives with a frined.   She is not employed. Regular exercise: Yes: silver sneakers and chair yoga History of abuse: Yes:    Family History:   Family History  Problem Relation Age of Onset   Alcoholism Mother    Kidney disease Mother    Kidney Stones Mother  Liver cancer Mother    Cancer Mother    Vision loss Mother    Alcoholism Father    Hyperlipidemia Father    Heart disease Father    Heart failure Father    Alcohol abuse Father    Arthritis Father    Alcoholism Maternal Grandfather    Hearing loss Brother    Heart disease Brother    Hyperlipidemia Brother    Hypertension Brother      Review of Systems: Review of Systems  Constitutional:  Negative for fever, malaise/fatigue and weight loss.  Respiratory:  Negative for cough, shortness of breath and wheezing.   Cardiovascular:  Negative for chest pain, palpitations and leg swelling.  Gastrointestinal:  Negative for abdominal pain and blood in stool.  Genitourinary:  Negative for dysuria.  Musculoskeletal:  Negative for myalgias.  Skin:  Negative for rash.  Neurological:  Positive for headaches. Negative for dizziness.  Endo/Heme/Allergies:  Does not bruise/bleed easily.       + hot flashes  Psychiatric/Behavioral:  Negative for depression. The patient is not nervous/anxious.      OBJECTIVE Physical Exam: Vitals:   07/19/22 1140  BP: 113/79  Pulse: 62  Weight: 167 lb (75.8 kg)  Height: 4' 10.66" (1.49  m)    Physical Exam Constitutional:      General: She is not in acute distress. Pulmonary:     Effort: Pulmonary effort is normal.  Abdominal:     General: There is no distension.     Palpations: Abdomen is soft.     Tenderness: There is no abdominal tenderness. There is no rebound.  Musculoskeletal:        General: No swelling. Normal range of motion.  Skin:    General: Skin is warm and dry.     Findings: No rash.  Neurological:     Mental Status: She is alert and oriented to person, place, and time.  Psychiatric:        Mood and Affect: Mood normal.        Behavior: Behavior normal.      GU / Detailed Urogynecologic Evaluation:  Pelvic Exam: Normal external female genitalia; Bartholin's and Skene's glands normal in appearance; urethral meatus normal in appearance, no urethral masses or discharge.   CST: negative  Speculum exam reveals normal vaginal mucosa with atrophy. Cervix normal appearance. Uterus normal single, nontender. Adnexa no mass, fullness, tenderness.    Pelvic floor strength I/V, puborectalis III/V external anal sphincter III/V  Pelvic floor musculature: Right levator non-tender, Right obturator non-tender, Left levator non-tender, Left obturator non-tender  POP-Q:   POP-Q  2                                            Aa   2                                           Ba  0                                              C   4  Gh  3.5                                            Pb  6                                            tvl   -2                                            Ap  -2                                            Bp  -3                                              D      Rectal Exam:  Normal sphincter tone, small distal rectocele, enterocoele not present, no rectal masses, no sign of dyssynergia when asking the patient to bear down.  Post-Void Residual (PVR) by Bladder Scan: In  order to evaluate bladder emptying, we discussed obtaining a postvoid residual and she agreed to this procedure.  Procedure: The ultrasound unit was placed on the patient's abdomen in the suprapubic region after the patient had voided. A PVR of 79 ml was obtained by bladder scan.  Laboratory Results: POC urine: moderate leukocytes   ASSESSMENT AND PLAN Ms. Panasuk is a 72 y.o. with:  1. Prolapse of anterior vaginal wall   2. Uterovaginal prolapse, incomplete   3. SUI (stress urinary incontinence, female)   4. Urinary frequency   5. Vaginal atrophy   6. Leukocytes in urine    Stage III anterior, Stage I posterior, Stage II apical prolapse - For treatment of pelvic organ prolapse, we discussed options for management including expectant management, conservative management, and surgical management, such as Kegels, a pessary, pelvic floor physical therapy, and specific surgical procedures. - She is potentially interested in surgery and recommended a [place] [facility], handout provided. She is a little hesitant to proceed with surgery and wants to also consider doing another pessary.  - Will need to obtain cardiac clearance due to history of MI- Dr Agustin Cree  2. Incontinence - Will evaluate further with urodynamic testing - For treatment of stress urinary incontinence, which is leakage with physical activity/movement/strainging/coughing, we discussed expectant management versus nonsurgical options versus surgery. Nonsurgical options include weight loss, physical therapy, as well as a pessary.  Surgical options include a midurethral sling, which is a synthetic mesh sling that acts like a hammock under the urethra to prevent leakage of urine, a Burch urethropexy, and transurethral injection of a bulking agent. - She is unsure about a sling and would prefer to try urethral bulking.   3. Vaginal atrophy - restart estrace cream 0.5g twice a week, new Rx provided  4. Leukocytes in urine - will  send for culture  Return for urodynamic testing  Jaquita Folds,  MD     

## 2022-07-21 ENCOUNTER — Encounter: Payer: Self-pay | Admitting: Obstetrics and Gynecology

## 2022-07-21 LAB — URINE CULTURE: Culture: 50000 — AB

## 2022-07-21 MED ORDER — SULFAMETHOXAZOLE-TRIMETHOPRIM 800-160 MG PO TABS: 1.0000 | ORAL_TABLET | Freq: Two times a day (BID) | ORAL | 0 refills | Status: AC

## 2022-07-21 MED ORDER — FLUCONAZOLE 150 MG PO TABS: 150.0000 mg | ORAL_TABLET | Freq: Once | ORAL | 0 refills | Status: AC

## 2022-07-21 NOTE — Addendum Note (Signed)
Addended by: Jaquita Folds on: 07/21/2022 10:09 AM   Modules accepted: Orders

## 2022-07-24 ENCOUNTER — Telehealth: Payer: Self-pay

## 2022-07-24 NOTE — Progress Notes (Signed)
Patient has been notified

## 2022-07-24 NOTE — Telephone Encounter (Signed)
-----   Message from Jaquita Folds, MD sent at 07/21/2022 10:09 AM EST ----- Please let patient know she has a UTI. Bactrim sent to the pharmacy.

## 2022-07-24 NOTE — Progress Notes (Signed)
Attempted to contact patient in regards to her lab results.

## 2022-07-27 DIAGNOSIS — H52203 Unspecified astigmatism, bilateral: Secondary | ICD-10-CM | POA: Insufficient documentation

## 2022-07-27 DIAGNOSIS — H35352 Cystoid macular degeneration, left eye: Secondary | ICD-10-CM | POA: Insufficient documentation

## 2022-07-27 DIAGNOSIS — H02831 Dermatochalasis of right upper eyelid: Secondary | ICD-10-CM | POA: Insufficient documentation

## 2022-07-27 DIAGNOSIS — H18413 Arcus senilis, bilateral: Secondary | ICD-10-CM | POA: Insufficient documentation

## 2022-07-31 DIAGNOSIS — H35352 Cystoid macular degeneration, left eye: Secondary | ICD-10-CM | POA: Diagnosis not present

## 2022-07-31 DIAGNOSIS — H34832 Tributary (branch) retinal vein occlusion, left eye, with macular edema: Secondary | ICD-10-CM | POA: Diagnosis not present

## 2022-08-02 ENCOUNTER — Encounter: Payer: Self-pay | Admitting: Obstetrics and Gynecology

## 2022-08-02 DIAGNOSIS — N3000 Acute cystitis without hematuria: Secondary | ICD-10-CM

## 2022-08-02 MED ORDER — SULFAMETHOXAZOLE-TRIMETHOPRIM 800-160 MG PO TABS: 1.0000 | ORAL_TABLET | Freq: Two times a day (BID) | ORAL | 0 refills | Status: AC

## 2022-08-05 ENCOUNTER — Other Ambulatory Visit: Payer: Self-pay | Admitting: Family Medicine

## 2022-08-05 DIAGNOSIS — E034 Atrophy of thyroid (acquired): Secondary | ICD-10-CM

## 2022-08-07 NOTE — Progress Notes (Unsigned)
[facility] Urodynamics Procedure  Referring Physician: Darreld Mclean, MD Date of Procedure: 08/09/2022  [name] is a 72 y.o. female who presents for urodynamic evaluation. Indication(s) for study: SUI, UUI, and OAB  Vital Signs: There were no vitals taken for this visit.  Laboratory Results: A catheterized urine specimen revealed:  POC urine:    Voiding Diary: The patient voided {NUMBERS; 1-20:18551} times per day. Voided volumes ranged from *** mL to *** mL, with an average of *** mL.  Intervals between voids ranged from *** hr to ***hr with an average interval between voids of *** hr.  Intake per day ranged from ***mL to ***mL.  Output ranged from ***mL to ***mL.  She had *** incontinence episodes per day (*** UUI, *** SUI) and *** episodes of nocturia.  She drinks: *** oz of caffeinated beverages, *** oz of juice, and *** oz of alcohol per day.  Procedure Timeout:  The correct patient was verified and the correct procedure was verified. The patient was in the correct position and safety precautions were reviewed based on at the patient's history.  Urodynamic Procedure A 75F dual lumen urodynamics catheter was placed under sterile conditions into the patient's bladder. A 75F catheter was placed into the {vagina/ rectum:24786} in order to measure abdominal pressure. EMG patches were placed in the appropriate position.  All connections were confirmed and calibrations/adjusted made. Saline was instilled into the bladder through the dual lumen catheters.  Cough/valsalva pressures were measured periodically during filling.  Patient was allowed to void.  The bladder was then emptied of its residual.  UROFLOW: Revealed a Qmax of *** mL/sec.  She voided *** mL and had a residual of *** mL.  It was a {uroflow:24789} pattern and represented normal habits ***though interpretation limited due to low voided volume.***  CMG: This was performed with sterile water in the  sitting position at a fill rate of 30*** mL/min.    First sensation of fullness was *** mLs,  First urge was *** mLs,  Strong urge was *** mLs and  Capacity was *** mLs  Stress incontinence {WAS/WAS NOT:[phone]::"was not"} demonstrated {highest/ lowest:24787} {Desc; negative/positive:13464} CLPP was *** cmH20 at *** ml. {highest/ lowest:24787} {Desc; negative/positive:13464} VLPP was *** cmH20at *** ml. {highest/ lowest:24787} {Desc; negative/positive:13464} Barrier CLPP was *** cmH20 at *** ml. {highest/ lowest:24787} {Desc; negative/positive:13464} Barrier VLPP was *** cmH20 at *** ml.  Detrusor function was {Desc; normal/underactive/overactive:13463}, {With/no:32556} phasic contractions seen.  The first occurred at *** mL to *** cm of water and {WAS/WAS NOT:[phone]::"was not"} associated with {urge/ LQ:[id number].  Compliance:  ***. End fill detrusor pressure was ***cmH20.  Calculated compliance was ***IV:[id number]  UPP: MUCP {With/without:5700} barrier reduction was *** cm of water.    MICTURITION STUDY: Voiding was performed {reduction:24791} in the sitting position.  Pdet at Qmax was *** cm of water.  Qmax was *** mL/sec.  It was a {uroflow:24789} pattern.  She voided *** mL and had a residual of *** mL.  It was a volitional void, sustained detrusor contraction was {DESC; PRESENT/NOT PRESENT:21021351} and abdominal straining was {DESC; PRESENT/NOT PRESENT:21021351}  EMG: This was performed with patches.  She had voluntary contractions, recruitment with fill was {DESC; PRESENT/NOT PRESENT:21021351} and urethral sphincter was {relaxed:24792} with void.  The details of the procedure with the study tracings have been scanned into EPIC.   Urodynamic Impression:  1. Sensation was {DESC; NORMAL/REDUCED/ABSENT/INCREASED:23133}; capacity was {DESC; NORMAL/REDUCED/ABSENT/INCREASED:23133} 2. Stress Incontinence {WAS/WAS NOT:[phone]::"was not"} demonstrated at {ISD:24793} pressures; 3.  Detrusor  Overactivity {WAS/WAS NOT:[phone]::"was not"} demonstrated {With-without:32421} leakage. 4. Emptying was {dysfunctional:24794} with a {elevated:24795} PVR, a sustained detrusor contraction {DESC; PRESENT/NOT PRESENT:21021351},  abdominal straining {DESC; PRESENT/NOT PRESENT:21021351}, {dyssynergic:24796} urethral sphincter activity on EMG.  Plan: - The patient will follow up  to discuss the findings and treatment options.

## 2022-08-09 ENCOUNTER — Ambulatory Visit (INDEPENDENT_AMBULATORY_CARE_PROVIDER_SITE_OTHER): Payer: Medicare Other | Admitting: Obstetrics and Gynecology

## 2022-08-09 ENCOUNTER — Encounter: Payer: Self-pay | Admitting: Obstetrics and Gynecology

## 2022-08-09 VITALS — BP 133/69 | HR 53

## 2022-08-09 DIAGNOSIS — N393 Stress incontinence (female) (male): Secondary | ICD-10-CM | POA: Diagnosis not present

## 2022-08-09 DIAGNOSIS — N952 Postmenopausal atrophic vaginitis: Secondary | ICD-10-CM

## 2022-08-09 DIAGNOSIS — R350 Frequency of micturition: Secondary | ICD-10-CM | POA: Diagnosis not present

## 2022-08-09 LAB — POCT URINALYSIS DIPSTICK
Bilirubin, UA: NEGATIVE
Blood, UA: NEGATIVE
Glucose, UA: NEGATIVE
Ketones, UA: NEGATIVE
Leukocytes, UA: NEGATIVE
Nitrite, UA: NEGATIVE
Protein, UA: NEGATIVE
Spec Grav, UA: 1.01 (ref 1.010–1.025)
Urobilinogen, UA: 0.2 E.U./dL
pH, UA: 6 (ref 5.0–8.0)

## 2022-08-09 MED ORDER — ESTRADIOL 7.5 MCG/24HR VA RING: 1.0000 | VAGINAL_RING | VAGINAL | 4 refills | Status: DC

## 2022-08-09 NOTE — Addendum Note (Signed)
Addended by: Berton Mount on: 08/09/2022 05:39 PM   Modules accepted: Orders

## 2022-08-09 NOTE — Patient Instructions (Signed)
Taking Care of Yourself after Urodynamics   Drink plenty of water for a day or two following your procedure. Try to have about 8 ounces (one cup) at a time, and do this 6 times or more per day unless you have fluid restrictitons AVOID irritative beverages such as coffee, tea, soda, alcoholic or citrus drinks for a day or two, as this may cause burning with urination. You may experience some discomfort or a burning sensation with urination after having this procedure. You can use over the counter Azo or pyridium to help with burning and follow the instructions on the packaging. If it does not improve within 1-2 days, or other symptoms appear (fever, chills, or difficulty urinating) call the office to speak to a nurse.  You may return to normal daily activities such as work, school, driving, exercising and housework on the day of the procedure.    

## 2022-08-23 ENCOUNTER — Other Ambulatory Visit (HOSPITAL_COMMUNITY)
Admission: RE | Admit: 2022-08-23 | Discharge: 2022-08-23 | Disposition: A | Discharge status: Home / Self Care | Payer: Medicare Other | Source: Ambulatory Visit | Attending: Obstetrics and Gynecology | Admitting: Obstetrics and Gynecology

## 2022-08-23 ENCOUNTER — Ambulatory Visit (INDEPENDENT_AMBULATORY_CARE_PROVIDER_SITE_OTHER): Payer: Medicare Other | Admitting: Obstetrics and Gynecology

## 2022-08-23 ENCOUNTER — Encounter: Payer: Self-pay | Admitting: Obstetrics and Gynecology

## 2022-08-23 VITALS — BP 140/77 | HR 63

## 2022-08-23 DIAGNOSIS — R350 Frequency of micturition: Secondary | ICD-10-CM | POA: Diagnosis not present

## 2022-08-23 DIAGNOSIS — N8110 Cystocele, unspecified: Secondary | ICD-10-CM

## 2022-08-23 DIAGNOSIS — N393 Stress incontinence (female) (male): Secondary | ICD-10-CM

## 2022-08-23 DIAGNOSIS — N949 Unspecified condition associated with female genital organs and menstrual cycle: Secondary | ICD-10-CM | POA: Diagnosis not present

## 2022-08-23 DIAGNOSIS — N3281 Overactive bladder: Secondary | ICD-10-CM | POA: Diagnosis not present

## 2022-08-23 DIAGNOSIS — R82998 Other abnormal findings in urine: Secondary | ICD-10-CM | POA: Diagnosis not present

## 2022-08-23 DIAGNOSIS — N812 Incomplete uterovaginal prolapse: Secondary | ICD-10-CM | POA: Diagnosis not present

## 2022-08-23 LAB — POCT URINALYSIS DIPSTICK
Bilirubin, UA: NEGATIVE
Glucose, UA: NEGATIVE
Ketones, UA: NEGATIVE
Nitrite, UA: NEGATIVE
Protein, UA: NEGATIVE
Spec Grav, UA: 1.015 (ref 1.010–1.025)
Urobilinogen, UA: 0.2 E.U./dL
pH, UA: 6.5 (ref 5.0–8.0)

## 2022-08-23 MED ORDER — LIDOCAINE HCL 2 % EX GEL: 1.0000 | CUTANEOUS | 11 refills | Status: DC | PRN

## 2022-08-23 NOTE — Progress Notes (Signed)
[facility] Return Visit  SUBJECTIVE  History of Present Illness: [name] is a 72 y.o. female seen in follow-up for prolapse and incontinence. She underwent urodynamic testing.  She is interested in surgery but wants to wait until after the summer to schedule. She does not feel she is in a rush. Has some concerns about the anesthesia and wants to see her cardiologist since it has been a while. She is using estrogen cream three times a week and it is helping with the discomfort. She does experience some vaginal burning on the labia. Has a history of HSV and is on acyclovir. She uses viscous lidocaine and it does help with the burning.   Urodynamic Impression:  1. Sensation was reduced; capacity was normal 2. Stress Incontinence was demonstrated at normal pressures; 3. Detrusor Overactivity was demonstrated without leakage. 4. Emptying was normal with a normal PVR, a sustained detrusor contraction present,  abdominal straining not present, normal urethral sphincter activity on EMG.  Past Medical History: Patient  has a past medical history of Allergy (1979), Arthritis, Bilateral carotid bruits, Blood transfusion without reported diagnosis, Branch retinal vein occlusion of left eye with macular edema (01/09/2018), Cataract (2016), Chicken pox, Closed fracture of left wrist (12/13/2018), Diverticulosis of colon (05/16/2015), Dyslipidemia, Genital herpes, GERD (gastroesophageal reflux disease), Hepatitis B, HSV-2 (herpes simplex virus 2) infection (06/11/2016), Hyperlipidemia (1974), Hypothyroidism, Hypothyroidism (acquired) (06/11/2016), Kidney stone, NSTEMI (non-ST elevated myocardial infarction) (04/15/2018), Osteoarthritis (06/11/2016), Osteopenia (11/30/2017), Pancreatitis, Peripheral vascular disease, unspecified (HCC) right subclavian artery stenosis (04/18/2019), Prediabetes (03/11/2020), Pseudophakia of both eyes (02/18/2018), PVD (posterior vitreous detachment), bilateral  (02/18/2018), S/P CABG x 4 04/15/18 (04/17/2018), Substance abuse (1964), and Systolic murmur.   Past Surgical History: She  has a past surgical history that includes Appendectomy (1972); Tonsillectomy; laparoscopy; Ovarian cyst surgery; LEFT HEART CATH AND CORONARY ANGIOGRAPHY (N/A, 04/16/2018); Coronary artery bypass graft (N/A, 04/17/2018); TEE without cardioversion (N/A, 04/17/2018); Endoharvest vein of greater saphenous vein (Right, 04/17/2018); Eye surgery (11/2015); Fracture surgery (R clavicle fx 1989?); and Wrist surgery (Left, 11/2018).   Medications: She has a current medication list which includes the following prescription(s): acetaminophen, acyclovir, aspirin ec, atorvastatin, cholecalciferol, clobetasol cream, comirnaty, ezetimibe, influenza vaccine adjuvanted, levothyroxine, lidocaine, metoprolol tartrate, and omeprazole.   Allergies: Patient is allergic to dye fdc blue  [fd&c blue #1 (brilliant blue)], contrast media [iodinated contrast media], povidone-iodine, and scopolamine.   Social History: Patient  reports that she quit smoking about 31 years ago. Her smoking use included cigarettes. She has a 84.00 pack-year smoking history. She has never used smokeless tobacco. She reports that she does not currently use alcohol. She reports that she does not currently use drugs after having used the following drugs: Amphetamines, Barbituates, Benzodiazepines, "Crack" cocaine, Cocaine, Heroin, LSD, Methaqualone, and Psilocybin.      OBJECTIVE     Physical Exam: Vitals:   08/23/22 1105  BP: (!) 140/77  Pulse: 63   Gen: No apparent distress, A&O x 3.  Detailed Urogynecologic Evaluation:  Normal appearing external genitalia and urethra. Scarring of old HSV lesions on inner labia minora.   Prior exam showed:  POP-Q   2                                            Aa   2  Ba   0                                              C    4                                             Gh   3.5                                            Pb   6                                            tvl    -2                                            Ap   -2                                            Bp   -3                                              D        POC urine: small leukocytes  ASSESSMENT AND PLAN    Ms. Rodin is a 72 y.o. with:  1. Prolapse of anterior vaginal wall   2. Uterovaginal prolapse, incomplete   3. Vaginal burning   4. Urinary frequency   5. Leukocytes in urine   6. Overactive bladder   7. SUI (stress urinary incontinence, female)    - We reviewed urodynamic testing. She is interested in urethral bulking for SUI symptoms at time of prolapse repair.  - She does not feel that urgency symptoms are bothersome enough at this time for treatment.  - For scarring from HSV lesions, can continue with lidocaine jelly- prescription provided. Also discussed compounded gabapentin cream but this may be more expensive at the compounding pharmacy for her so she does not want to try at this time.  - Continue with vaginal estrogen three times per week.  - She is not ready to schedule surgery at this time. She will notify us when she wants to proceed with scheduling.   Plan for surgery: Exam under anesthesia, le forte colpocleisis, posterior repair and perineorrhaphy, urethral bulking, cystoscopy  - We reviewed the patient's specific anatomic and functional findings, with the assistance of diagrams, and together finalized the above procedure. The planned surgical procedures were discussed along with the surgical risks outlined below, which were also provided on a detailed handout. Additional treatment options including expectant management, conservative management, medical management were discussed where appropriate.  We reviewed the benefits and risks of  each treatment option.   General Surgical Risks: For all procedures, there  are risks of bleeding, infection, damage to surrounding organs including but not limited to bowel, bladder, blood vessels, ureters and nerves, and need for further surgery if an injury were to occur. These risks are all low with minimally invasive surgery.   There are risks of numbness and weakness at any body site or buttock/rectal pain.  It is possible that baseline pain can be worsened by surgery, either with or without mesh. If surgery is vaginal, there is also a low risk of possible conversion to laparoscopy or open abdominal incision where indicated. Very rare risks include blood transfusion, blood clot, heart attack, pneumonia, or death.   There is also a risk of short-term postoperative urinary retention with need to use a catheter. About half of patients need to go home from surgery with a catheter, which is then later removed in the office. The risk of long-term need for a catheter is very low. There is also a risk of worsening of overactive bladder.   - For preop Visit:  She is required to have a visit within 30 days of her surgery.   - Medical clearance: required from Cardiologist- will plan to send letter when she is ready to schedule surgery - Anticoagulant use: No - Medicaid Hysterectomy form: n/a - Accepts blood transfusion: Yes - Expected length of stay: outpatient   Marguerita Beards, MD

## 2022-08-24 LAB — URINE CULTURE

## 2022-08-24 NOTE — Progress Notes (Signed)
[facility] [address], [address] [place], [place] [address] [phone] [phone]  Date:  08/28/2022   Name:  Kimberly LutesCarolyn Barnes   DOB:  01-Dec-1950   MRN:  [id number]  PCP:  [name], Kimberly Leflore C, MD    Chief Complaint: Medication Refill (Concerns/ questions:  pt says she does not think the UTI sxs have resolved since 08/14/22. )   History of Present Illness:  [name] is a 72 y.o. very pleasant female patient who presents with the following:  Pt seen today for medication follow-up Last seen by myself in August - history of NSTEMI and CABG in 2019, hypothyroidism, dyslipidemia, GERD, borderline prediabetes   She is seen by urogynecology - Dr Tildon HuskyShroeder- just recently  She needs to check her thyroid today  She does feel like she may still have a UTI- she was treated with bactrim for 3 days, symptoms persisted so she was given and then 7 days more.  She is not sure of the exact date, but this started the beginning of April She does still note urgency and more difficulty with bladder control than typical, she wonders if infection is still active  Lab Results  Component Value Date   TSH 4.01 02/07/2022     Patient Active Problem List   Diagnosis Date Noted   Poor dentition 12/28/2021   Arthritis    Chicken pox    Genital herpes    Hepatitis B    Hypothyroidism    Kidney stone    Pancreatitis    Prediabetes 03/11/2020   Peripheral vascular disease, unspecified (HCC) right subclavian artery stenosis 04/18/2019   Closed fracture of left wrist 12/13/2018   S/P CABG x 4 04/15/18 04/17/2018   NSTEMI (non-ST elevated myocardial infarction) 04/15/2018   Bilateral carotid bruits    Systolic murmur    Pseudophakia of both eyes 02/18/2018   PVD (posterior vitreous detachment), bilateral 02/18/2018   Branch retinal vein occlusion of left eye with macular edema 01/09/2018   Osteopenia 11/30/2017   GERD (gastroesophageal reflux disease)  06/11/2016   Osteoarthritis 06/11/2016   Hypothyroidism (acquired) 06/11/2016   Dyslipidemia 06/11/2016   HSV-2 (herpes simplex virus 2) infection 06/11/2016   Diverticulosis of colon 05/16/2015    Past Medical History:  Diagnosis Date   Allergy 1979   Arthritis    Bilateral carotid bruits    1-39% bilateral ICA stenosis   Blood transfusion without reported diagnosis    Branch retinal vein occlusion of left eye with macular edema 01/09/2018   Cataract 2016   Bilateral cataract surgery 2017   Chicken pox    Closed fracture of left wrist 12/13/2018   Formatting of this note might be different from the original. Added automatically from request for surgery [id number]   Diverticulosis of colon 05/16/2015   Dyslipidemia    Genital herpes    GERD (gastroesophageal reflux disease)    Hepatitis B    HSV-2 (herpes simplex virus 2) infection 06/11/2016   Hyperlipidemia 1974   On meds   Hypothyroidism    Hypothyroidism (acquired) 06/11/2016   Kidney stone    NSTEMI (non-ST elevated myocardial infarction) 04/15/2018   Troponin 0.58 on 04/15/18   Osteoarthritis 06/11/2016   Osteopenia 11/30/2017   Pancreatitis    Peripheral vascular disease, unspecified (HCC) right subclavian artery stenosis 04/18/2019   Prediabetes 03/11/2020   Pseudophakia of both eyes 02/18/2018   PVD (posterior vitreous detachment), bilateral 02/18/2018   S/P CABG x  4 04/15/18 04/17/2018   LIMA to LAD SVG to DIAGONAL SVG to OM SVG to RCA   Substance abuse 1964   Clean and sober since 07/29/1989   Systolic murmur     Past Surgical History:  Procedure Laterality Date   APPENDECTOMY  1972   CORONARY ARTERY BYPASS GRAFT N/A 04/17/2018   Procedure: CORONARY ARTERY BYPASS GRAFTING (CABG) TIMES USING LEFT INTERNAL MAMMARY ARTERY TO LAD AND SAPHENOUS VEIN TO DIAG 1, OM1, AND RCA;  Surgeon: Delight Ovens, MD;  Location: [facility] OR;  Service: Open Heart Surgery;  Laterality: N/A;   ENDOVEIN HARVEST OF GREATER SAPHENOUS  VEIN Right 04/17/2018   Procedure: ENDOVEIN HARVEST OF GREATER SAPHENOUS VEIN RIGHT THIGH AND CALF;  Surgeon: Delight Ovens, MD;  Location: [facility] OR;  Service: Open Heart Surgery;  Laterality: Right;   EYE SURGERY  11/2015   Bilateral cataract surgery   FRACTURE SURGERY  R clavicle fx 1989?   Repair failed   LAPAROSCOPY     LEFT HEART CATH AND CORONARY ANGIOGRAPHY N/A 04/16/2018   Procedure: LEFT HEART CATH AND CORONARY ANGIOGRAPHY;  Surgeon: Kathleene Hazel, MD;  Location: [facility] INVASIVE CV LAB;  Service: Cardiovascular;  Laterality: N/A;   OVARIAN CYST SURGERY     TEE WITHOUT CARDIOVERSION N/A 04/17/2018   Procedure: TRANSESOPHAGEAL ECHOCARDIOGRAM (TEE);  Surgeon: Delight Ovens, MD;  Location: [facility] OR;  Service: Open Heart Surgery;  Laterality: N/A;   TONSILLECTOMY     WRIST SURGERY Left 11/2018   Plate placement.    Social History   Tobacco Use   Smoking status: Former    Packs/day: 3.00    Years: 28.00    Additional pack years: 0.00    Total pack years: 84.00    Types: Cigarettes    Quit date: 04/22/1991    Years since quitting: 31.3   Smokeless tobacco: Never  Vaping Use   Vaping Use: Never used  Substance Use Topics   Alcohol use: Not Currently    Comment: Last drink 07/1989   Drug use: Not Currently    Types: Amphetamines, Barbituates, Benzodiazepines, "Crack" cocaine, Cocaine, Heroin, LSD, Methaqualone, Psilocybin    Family History  Problem Relation Age of Onset   Alcoholism Mother    Kidney disease Mother    Kidney Stones Mother    Liver cancer Mother    Cancer Mother    Vision loss Mother    Alcoholism Father    Hyperlipidemia Father    Heart disease Father    Heart failure Father    Alcohol abuse Father    Arthritis Father    Alcoholism Maternal Grandfather    Hearing loss Brother    Heart disease Brother    Hyperlipidemia Brother    Hypertension Brother     Allergies  Allergen Reactions   Dye Fdc Blue  [Fd&Barnes Blue #1 (Brilliant Blue)]  Anaphylaxis    Dye used for CAT scans   Contrast Media [Iodinated Contrast Media]    Povidone-Iodine Dermatitis, Rash and Swelling   Scopolamine Anxiety and Other (See Comments)    Medication list has been reviewed and updated.  Current Outpatient Medications on File Prior to Visit  Medication Sig Dispense Refill   acetaminophen (TYLENOL) 500 MG tablet Take 500 mg by mouth every 6 (six) hours as needed.     acyclovir (ZOVIRAX) 400 MG tablet Take 1 tablet (400 mg total) by mouth 2 (two) times daily. 180 tablet 3   aspirin EC 81 MG EC tablet Take  1 tablet (81 mg total) by mouth daily.     atorvastatin (LIPITOR) 80 MG tablet Take 1 tablet (80 mg total) by mouth daily. 90 tablet 3   cholecalciferol (VITAMIN D3) 25 MCG (1000 UNIT) tablet Take 2,000 Units by mouth daily.     clobetasol cream (TEMOVATE) 0.05 % Apply 1 Application topically 2 (two) times daily. Apply to affected area twice a day 30 g 2   ezetimibe (ZETIA) 10 MG tablet Take 1 tablet (10 mg total) by mouth daily. 90 tablet 3   levothyroxine (SYNTHROID) 88 MCG tablet Take 1 tablet (88 mcg total) by mouth daily before breakfast. 30 tablet 0   lidocaine (XYLOCAINE) 2 % jelly Apply 1 Application topically as needed. 28 g 11   metoprolol tartrate (LOPRESSOR) 25 MG tablet Take 0.5 tablets (12.5 mg total) by mouth 2 (two) times daily. 90 tablet 2   No current facility-administered medications on file prior to visit.    Review of Systems:  As per HPI- otherwise negative.   Physical Examination: Vitals:   08/28/22 0907  BP: 132/80  Resp: 18  Temp: 97.9 F (36.6 Barnes)  SpO2: 97%   Vitals:   08/28/22 0907  Weight: 167 lb 6.4 oz (75.9 kg)  Height: 5\' 1"  (1.549 m)   Body mass index is 31.63 kg/m. Ideal Body Weight: Weight in (lb) to have BMI = 25: 132  GEN: no acute distress. HEENT: Atraumatic, Normocephalic.  Bilateral TM wnl, oropharynx normal.  PEERL,EOMI.   Ears and Nose: No external deformity. CV: RRR, No M/G/R. No JVD.  No thrill. No extra heart sounds. PULM: CTA B, no wheezes, crackles, rhonchi. No retractions. No resp. distress. No accessory muscle use. ABD: S, NT, ND No rebound. No HSM. EXTR: No Barnes/Barnes/e PSYCH: Normally interactive. Conversant.  Poor dentition- stable   Assessment and Plan: Prediabetes - Plan: Comprehensive metabolic panel, Hemoglobin A1c  Gastroesophageal reflux disease - Plan: omeprazole (PRILOSEC) 20 MG capsule  Screening for deficiency anemia - Plan: CBC  Frequent UTI - Plan: Urine Culture  Hyperlipidemia, unspecified hyperlipidemia type - Plan: Lipid panel  Acquired hypothyroidism - Plan: TSH  Medication monitoring encounter - Plan: CBC Patient seen today for follow-up.  Will monitor her prediabetes and other routine lab work as above Will send thyroid med to [facility] once I receive her TSH She is using Prilosec for GERD, refilled today She notes recent UTI, she suspects the infection may still be active.  Will obtain a repeat urine culture Both her thyroid medication and Prilosec sent a warning regarding her blue dye allergy.  However, patient states she is not actually allergic to blue dye, she had an allergy to some sort of dye used in a CT scan.  In any case she has been using Prilosec and levothyroxine with no issue.  I did put a comment to the pharmacy however so they could double check for any concern of cross-reactivity Signed Abbe Amsterdam, MD  Received labs so far as below, message to patient We will refill her thyroid medication  Results for orders placed or performed in visit on 08/28/22  TSH  Result Value Ref Range   TSH 3.72 0.35 - 5.50 uIU/mL  CBC  Result Value Ref Range   WBC 5.6 4.0 - 10.5 K/uL   RBC 4.04 3.87 - 5.11 Mil/uL   Platelets 275.0 150.0 - 400.0 K/uL   Hemoglobin 13.8 12.0 - 15.0 g/dL   HCT 95.6 21.3 - 08.6 %   MCV 100.1 (H) 78.0 -  100.0 fl   MCHC 34.2 30.0 - 36.0 g/dL   RDW 16.1 09.6 - 04.5 %  Comprehensive metabolic panel  Result  Value Ref Range   Sodium 135 135 - 145 mEq/L   Potassium 4.1 3.5 - 5.1 mEq/L   Chloride 99 96 - 112 mEq/L   CO2 27 19 - 32 mEq/L   Glucose, Bld 87 70 - 99 mg/dL   BUN 10 6 - 23 mg/dL   Creatinine, Ser 4.09 0.40 - 1.20 mg/dL   Total Bilirubin 0.5 0.2 - 1.2 mg/dL   Alkaline Phosphatase 66 39 - 117 U/L   AST 24 0 - 37 U/L   ALT 20 0 - 35 U/L   Total Protein 6.6 6.0 - 8.3 g/dL   Albumin 4.2 3.5 - 5.2 g/dL   GFR 81.19 >14.78 mL/min   Calcium 8.9 8.4 - 10.5 mg/dL  Hemoglobin G9F  Result Value Ref Range   Hgb A1c MFr Bld 5.7 4.6 - 6.5 %  Lipid panel  Result Value Ref Range   Cholesterol 132 0 - 200 mg/dL   Triglycerides 62.1 0.0 - 149.0 mg/dL   HDL 30.86 >57.84 mg/dL   VLDL 69.6 0.0 - 29.5 mg/dL   LDL Cholesterol 56 0 - 99 mg/dL   Total CHOL/HDL Ratio 2    NonHDL 75.31

## 2022-08-28 ENCOUNTER — Ambulatory Visit (INDEPENDENT_AMBULATORY_CARE_PROVIDER_SITE_OTHER): Payer: Medicare Other | Admitting: Family Medicine

## 2022-08-28 ENCOUNTER — Encounter: Payer: Self-pay | Admitting: Family Medicine

## 2022-08-28 VITALS — BP 132/80 | Temp 97.9°F | Resp 18 | Ht 61.0 in | Wt 167.4 lb

## 2022-08-28 DIAGNOSIS — E785 Hyperlipidemia, unspecified: Secondary | ICD-10-CM | POA: Diagnosis not present

## 2022-08-28 DIAGNOSIS — E039 Hypothyroidism, unspecified: Secondary | ICD-10-CM

## 2022-08-28 DIAGNOSIS — N390 Urinary tract infection, site not specified: Secondary | ICD-10-CM

## 2022-08-28 DIAGNOSIS — Z5181 Encounter for therapeutic drug level monitoring: Secondary | ICD-10-CM | POA: Diagnosis not present

## 2022-08-28 DIAGNOSIS — Z130 Encounter for screening for diseases of the blood and blood-forming organs and certain disorders involving the immune mechanism: Secondary | ICD-10-CM

## 2022-08-28 DIAGNOSIS — K219 Gastro-esophageal reflux disease without esophagitis: Secondary | ICD-10-CM

## 2022-08-28 DIAGNOSIS — E034 Atrophy of thyroid (acquired): Secondary | ICD-10-CM | POA: Diagnosis not present

## 2022-08-28 DIAGNOSIS — R7303 Prediabetes: Secondary | ICD-10-CM

## 2022-08-28 LAB — COMPREHENSIVE METABOLIC PANEL
ALT: 20 U/L (ref 0–35)
AST: 24 U/L (ref 0–37)
Albumin: 4.2 g/dL (ref 3.5–5.2)
Alkaline Phosphatase: 66 U/L (ref 39–117)
BUN: 10 mg/dL (ref 6–23)
CO2: 27 mEq/L (ref 19–32)
Calcium: 8.9 mg/dL (ref 8.4–10.5)
Chloride: 99 mEq/L (ref 96–112)
Creatinine, Ser: 0.65 mg/dL (ref 0.40–1.20)
GFR: 86.99 mL/min (ref >60.00)
Glucose, Bld: 87 mg/dL (ref 70–99)
Potassium: 4.1 mEq/L (ref 3.5–5.1)
Sodium: 135 mEq/L (ref 135–145)
Total Bilirubin: 0.5 mg/dL (ref 0.2–1.2)
Total Protein: 6.6 g/dL (ref 6.0–8.3)

## 2022-08-28 LAB — CBC
HCT: 40.4 % (ref 36.0–46.0)
Hemoglobin: 13.8 g/dL (ref 12.0–15.0)
MCHC: 34.2 g/dL (ref 30.0–36.0)
MCV: 100.1 fl — ABNORMAL HIGH (ref 78.0–100.0)
Platelets: 275 10*3/uL (ref 150.0–400.0)
RBC: 4.04 Mil/uL (ref 3.87–5.11)
RDW: 12.5 % (ref 11.5–15.5)
WBC: 5.6 10*3/uL (ref 4.0–10.5)

## 2022-08-28 LAB — LIPID PANEL
Cholesterol: 132 mg/dL (ref 0–200)
HDL: 57.1 mg/dL (ref >39.00)
LDL Cholesterol: 56 mg/dL (ref 0–99)
NonHDL: 75.31
Total CHOL/HDL Ratio: 2
Triglycerides: 97 mg/dL (ref 0.0–149.0)
VLDL: 19.4 mg/dL (ref 0.0–40.0)

## 2022-08-28 LAB — HEMOGLOBIN A1C: Hgb A1c MFr Bld: 5.7 % (ref 4.6–6.5)

## 2022-08-28 LAB — TSH: TSH: 3.72 u[IU]/mL (ref 0.35–5.50)

## 2022-08-28 MED ORDER — OMEPRAZOLE 20 MG PO CPDR
20.0000 mg | DELAYED_RELEASE_CAPSULE | Freq: Every day | ORAL | 3 refills | Status: DC
Start: 2022-08-28 — End: 2022-12-06

## 2022-08-28 MED ORDER — LEVOTHYROXINE SODIUM 88 MCG PO TABS: 88.0000 ug | ORAL_TABLET | Freq: Every day | ORAL | 3 refills | Status: DC

## 2022-08-28 NOTE — Patient Instructions (Addendum)
It was good to see you today!   I will be in touch with your labs and will refill your thyroid meds to the [facility] when your TSH comes in  Assuming all is well let's plan to visit in about 6 months Happy birthday! You look great!

## 2022-08-29 LAB — URINE CULTURE
MICRO NUMBER:: 14824869
Result:: NO GROWTH
SPECIMEN QUALITY:: ADEQUATE

## 2022-08-30 ENCOUNTER — Encounter: Payer: Self-pay | Admitting: Family Medicine

## 2022-09-01 ENCOUNTER — Telehealth: Payer: Self-pay | Admitting: Family Medicine

## 2022-09-01 NOTE — Telephone Encounter (Signed)
Copied from CRM [id number]. Topic: Medicare AWV >> Sep 01, 2022 11:54 AM Payton Doughty wrote: Reason for CRM: Called patient to reschedule Medicare Annual Wellness Visit (AWV). Left message for patient to call back and reschedule Medicare Annual Wellness Visit (AWV).   Please schedule an appointment at any time with Donne Anon, CMA  .  If any questions, please contact me.  Thank you ,

## 2022-09-05 ENCOUNTER — Encounter: Payer: Self-pay | Admitting: Family Medicine

## 2022-09-05 DIAGNOSIS — B009 Herpesviral infection, unspecified: Secondary | ICD-10-CM

## 2022-09-05 MED ORDER — ACYCLOVIR 400 MG PO TABS
400.0000 mg | ORAL_TABLET | Freq: Two times a day (BID) | ORAL | 3 refills | Status: DC
Start: 2022-09-05 — End: 2023-11-22

## 2022-09-12 ENCOUNTER — Encounter: Payer: Self-pay | Admitting: Cardiology

## 2022-09-12 ENCOUNTER — Ambulatory Visit: Payer: Medicare Other | Attending: Cardiology | Admitting: Cardiology

## 2022-09-12 VITALS — BP 126/90 | HR 53 | Ht 60.0 in | Wt 167.0 lb

## 2022-09-12 DIAGNOSIS — R7303 Prediabetes: Secondary | ICD-10-CM | POA: Diagnosis not present

## 2022-09-12 DIAGNOSIS — G458 Other transient cerebral ischemic attacks and related syndromes: Secondary | ICD-10-CM | POA: Insufficient documentation

## 2022-09-12 DIAGNOSIS — Z951 Presence of aortocoronary bypass graft: Secondary | ICD-10-CM | POA: Diagnosis not present

## 2022-09-12 DIAGNOSIS — R011 Cardiac murmur, unspecified: Secondary | ICD-10-CM | POA: Diagnosis not present

## 2022-09-12 NOTE — Patient Instructions (Signed)

## 2022-09-12 NOTE — Progress Notes (Unsigned)
Cardiology Office Note:    Date:  09/12/2022   ID:  [name], DOB [birth year], MRN [id number]  PCP:  Pearline Cables, MD  Cardiologist:  Gypsy Balsam, MD    Referring MD: Pearline Cables, MD   Chief Complaint  Patient presents with   Follow-up  Doing well  History of Present Illness:    [name] is a 72 y.o. female past medical history significant for coronary artery disease test was coronary bypass graft done in April 15, 2018 with LIMA to LAD SVG to diagonal SVG to obtuse marginal SVG to RCA.  Peripheral vascular disease, steal syndrome on the right side, dyslipidemia, essential hypertension, prediabetes. Comes today to my office for follow-up overall doing well still exercise on the regular basis noticed some decrease in ability to exercise but no chest pain tightness squeezing pressure burning chest chest fatigue.  She does complain of having some pain in the right arm when we talk about potential intervention and she said she is not interested she is doing well and she is happy where she is.  Past Medical History:  Diagnosis Date   Allergy 1979   Arthritis    Bilateral carotid bruits    1-39% bilateral ICA stenosis   Blood transfusion without reported diagnosis    Branch retinal vein occlusion of left eye with macular edema 01/09/2018   Cataract 2016   Bilateral cataract surgery 2017   Chicken pox    Closed fracture of left wrist 12/13/2018   Formatting of this note might be different from the original. Added automatically from request for surgery [id number]   Diverticulosis of colon 05/16/2015   Dyslipidemia    Genital herpes    GERD (gastroesophageal reflux disease)    Hepatitis B    HSV-2 (herpes simplex virus 2) infection 06/11/2016   Hyperlipidemia 1974   On meds   Hypothyroidism    Hypothyroidism (acquired) 06/11/2016   Kidney stone    NSTEMI (non-ST elevated myocardial infarction) (HCC) 04/15/2018   Troponin 0.58 on 04/15/18    Osteoarthritis 06/11/2016   Osteopenia 11/30/2017   Pancreatitis    Peripheral vascular disease, unspecified (HCC) right subclavian artery stenosis 04/18/2019   Prediabetes 03/11/2020   Pseudophakia of both eyes 02/18/2018   PVD (posterior vitreous detachment), bilateral 02/18/2018   S/P CABG x 4 04/15/18 04/17/2018   LIMA to LAD SVG to DIAGONAL SVG to OM SVG to RCA   Substance abuse (HCC) 1964   Clean and sober since 07/29/1989   Systolic murmur     Past Surgical History:  Procedure Laterality Date   APPENDECTOMY  1972   CORONARY ARTERY BYPASS GRAFT N/A 04/17/2018   Procedure: CORONARY ARTERY BYPASS GRAFTING (CABG) TIMES USING LEFT INTERNAL MAMMARY ARTERY TO LAD AND SAPHENOUS VEIN TO DIAG 1, OM1, AND RCA;  Surgeon: Delight Ovens, MD;  Location: [facility] OR;  Service: Open Heart Surgery;  Laterality: N/A;   ENDOVEIN HARVEST OF GREATER SAPHENOUS VEIN Right 04/17/2018   Procedure: ENDOVEIN HARVEST OF GREATER SAPHENOUS VEIN RIGHT THIGH AND CALF;  Surgeon: Delight Ovens, MD;  Location: [facility] OR;  Service: Open Heart Surgery;  Laterality: Right;   EYE SURGERY  11/2015   Bilateral cataract surgery   FRACTURE SURGERY  R clavicle fx 1989?   Repair failed   LAPAROSCOPY     LEFT HEART CATH AND CORONARY ANGIOGRAPHY N/A 04/16/2018   Procedure: LEFT HEART CATH AND CORONARY ANGIOGRAPHY;  Surgeon: Kathleene Hazel, MD;  Location: [facility] INVASIVE CV LAB;  Service: Cardiovascular;  Laterality: N/A;   OVARIAN CYST SURGERY     TEE WITHOUT CARDIOVERSION N/A 04/17/2018   Procedure: TRANSESOPHAGEAL ECHOCARDIOGRAM (TEE);  Surgeon: Delight Ovens, MD;  Location: [facility] OR;  Service: Open Heart Surgery;  Laterality: N/A;   TONSILLECTOMY     WRIST SURGERY Left 11/2018   Plate placement.    Current Medications: Current Meds  Medication Sig   acetaminophen (TYLENOL) 500 MG tablet Take 500 mg by mouth every 6 (six) hours as needed for mild pain or moderate pain.   acyclovir (ZOVIRAX) 400 MG tablet Take  1 tablet (400 mg total) by mouth 2 (two) times daily.   aspirin EC 81 MG EC tablet Take 1 tablet (81 mg total) by mouth daily.   atorvastatin (LIPITOR) 80 MG tablet Take 1 tablet (80 mg total) by mouth daily.   cholecalciferol (VITAMIN D3) 25 MCG (1000 UNIT) tablet Take 2,000 Units by mouth daily.   clobetasol cream (TEMOVATE) 0.05 % Apply 1 Application topically 2 (two) times daily. Apply to affected area twice a day   ezetimibe (ZETIA) 10 MG tablet Take 1 tablet (10 mg total) by mouth daily.   levothyroxine (SYNTHROID) 88 MCG tablet Take 1 tablet (88 mcg total) by mouth daily before breakfast.   lidocaine (XYLOCAINE) 2 % jelly Apply 1 Application topically as needed. (Patient taking differently: Apply 1 Application topically as needed (pain).)   metoprolol tartrate (LOPRESSOR) 25 MG tablet Take 0.5 tablets (12.5 mg total) by mouth 2 (two) times daily.   omeprazole (PRILOSEC) 20 MG capsule Take 1 capsule (20 mg total) by mouth daily.     Allergies:   Dye fdc blue  [fd&c blue #1 (brilliant blue)], Contrast media [iodinated contrast media], Povidone-iodine, and Scopolamine   Social History   Socioeconomic History   Marital status: Divorced    Spouse name: Not on file   Number of children: Not on file   Years of education: Not on file   Highest education level: Associate degree: occupational, Scientist, product/process development, or vocational program  Occupational History   Not on file  Tobacco Use   Smoking status: Former    Packs/day: 3.00    Years: 28.00    Additional pack years: 0.00    Total pack years: 84.00    Types: Cigarettes    Quit date: 04/22/1991    Years since quitting: 31.4   Smokeless tobacco: Never  Vaping Use   Vaping Use: Never used  Substance and Sexual Activity   Alcohol use: Not Currently    Comment: Last drink 07/1989   Drug use: Not Currently    Types: Amphetamines, Barbituates, Benzodiazepines, "Crack" cocaine, Cocaine, Heroin, LSD, Methaqualone, Psilocybin   Sexual activity: Not  Currently    Birth control/protection: Post-menopausal  Other Topics Concern   Not on file  Social History Narrative   Not on file   Social Determinants of Health   Financial Resource Strain: Low Risk  (08/23/2022)   Overall Financial Resource Strain (CARDIA)    Difficulty of Paying Living Expenses: Not hard at all  Food Insecurity: No Food Insecurity (08/23/2022)   Hunger Vital Sign    Worried About Running Out of Food in the Last Year: Never true    Ran Out of Food in the Last Year: Never true  Transportation Needs: No Transportation Needs (08/23/2022)   PRAPARE - Administrator, Civil Service (Medical): No    Lack of Transportation (Non-Medical): No  Physical Activity: Sufficiently Active (08/23/2022)  Exercise Vital Sign    Days of Exercise per Week: 5 days    Minutes of Exercise per Session: 30 min  Stress: No Stress Concern Present (08/23/2022)   [employer] of Occupational Health - Occupational Stress Questionnaire    Feeling of Stress : Not at all  Social Connections: Unknown (08/23/2022)   Social Connection and Isolation Panel [NHANES]    Frequency of Communication with Friends and Family: More than three times a week    Frequency of Social Gatherings with Friends and Family: More than three times a week    Attends Religious Services: Patient declined    Database administrator or Organizations: Yes    Attends Engineer, structural: More than 4 times per year    Marital Status: Divorced     Family History: The patient's family history includes Alcohol abuse in her father; Alcoholism in her father, maternal grandfather, and mother; Arthritis in her father; Cancer in her mother; Hearing loss in her brother; Heart disease in her brother and father; Heart failure in her father; Hyperlipidemia in her brother and father; Hypertension in her brother; Kidney Stones in her mother; Kidney disease in her mother; Liver cancer in her mother; Vision loss in her  mother. ROS:   Please see the history of present illness.    All 14 point review of systems negative except as described per history of present illness  EKGs/Labs/Other Studies Reviewed:      Recent Labs: 08/28/2022: ALT 20; BUN 10; Creatinine, Ser 0.65; Hemoglobin 13.8; Platelets 275.0; Potassium 4.1; Sodium 135; TSH 3.72  Recent Lipid Panel    Component Value Date/Time   CHOL 132 08/28/2022 0949   CHOL 145 11/21/2021 1043   TRIG 97.0 08/28/2022 0949   HDL 57.10 08/28/2022 0949   HDL 53 11/21/2021 1043   CHOLHDL 2 08/28/2022 0949   VLDL 19.4 08/28/2022 0949   LDLCALC 56 08/28/2022 0949   LDLCALC 69 11/21/2021 1043    Physical Exam:    VS:  BP (!) 126/90 (BP Location: Left Arm, Patient Position: Sitting)   Pulse (!) 53   Ht 5' (1.524 m)   Wt 167 lb (75.8 kg)   SpO2 96%   BMI 32.61 kg/m     Wt Readings from Last 3 Encounters:  09/12/22 167 lb (75.8 kg)  08/28/22 167 lb 6.4 oz (75.9 kg)  07/19/22 167 lb (75.8 kg)     GEN:  Well nourished, well developed in no acute distress HEENT: Normal NECK: No JVD; No carotid bruits LYMPHATICS: No lymphadenopathy CARDIAC: RRR, no murmurs, no rubs, no gallops RESPIRATORY:  Clear to auscultation without rales, wheezing or rhonchi  ABDOMEN: Soft, non-tender, non-distended MUSCULOSKELETAL:  No edema; No deformity  SKIN: Warm and dry LOWER EXTREMITIES: no swelling NEUROLOGIC:  Alert and oriented x 3 PSYCHIATRIC:  Normal affect   ASSESSMENT:    1. S/P CABG x 4 04/15/18   2. Systolic murmur   3. Prediabetes   4. Steal syndrome, subclavian on the right    PLAN:    In order of problems listed above:  Status post coronary bypass graft doing well antiplatelet therapy will continue.  I will ask her to have echocardiogram to assess left ventricle ejection fraction. Dyslipidemia I did review her K PN which show me his LDL 56 HDL 57 excellent cholesterol control she is on high intense statin and Zetia which I will continue. Still  syndrome on the right side.  Mild symptoms I told her if  she decided to do something about it she need to let me know. We did talk about healthy lifestyle need to exercise on the regular basis which she understand that she is doing on the regular basis   Medication Adjustments/Labs and Tests Ordered: Current medicines are reviewed at length with the patient today.  Concerns regarding medicines are outlined above.  No orders of the defined types were placed in this encounter.  Medication changes: No orders of the defined types were placed in this encounter.   Signed, Georgeanna Lea, MD, [facility] 09/12/2022 9:49 AM    [facility]

## 2022-10-04 ENCOUNTER — Ambulatory Visit (HOSPITAL_BASED_OUTPATIENT_CLINIC_OR_DEPARTMENT_OTHER)
Admission: RE | Admit: 2022-10-04 | Discharge: 2022-10-04 | Disposition: A | Discharge status: Home / Self Care | Payer: Medicare Other | Source: Ambulatory Visit | Attending: Cardiology | Admitting: Cardiology

## 2022-10-04 DIAGNOSIS — R0609 Other forms of dyspnea: Secondary | ICD-10-CM | POA: Diagnosis not present

## 2022-10-04 DIAGNOSIS — R011 Cardiac murmur, unspecified: Secondary | ICD-10-CM | POA: Insufficient documentation

## 2022-10-04 LAB — ECHOCARDIOGRAM COMPLETE
AR max vel: 1.12 cm2
AV Area VTI: 1.22 cm2
AV Area mean vel: 1.18 cm2
AV Mean grad: 7.7 mmHg
AV Peak grad: 14.3 mmHg
Ao pk vel: 1.89 m/s
Area-P 1/2: 4.8 cm2
S' Lateral: 2.5 cm

## 2022-10-06 ENCOUNTER — Telehealth: Payer: Self-pay | Admitting: Cardiology

## 2022-10-06 ENCOUNTER — Encounter: Payer: Self-pay | Admitting: Cardiology

## 2022-10-06 NOTE — Telephone Encounter (Signed)
Patient is returning a call to discuss echo results. She is getting in the car and would like a car back in an hour or so.

## 2022-10-10 ENCOUNTER — Telehealth: Payer: Self-pay

## 2022-10-10 NOTE — Telephone Encounter (Signed)
Results viewed in My Chart, per Dr. Vanetta Shawl note. Routed to PCP.

## 2022-10-25 ENCOUNTER — Encounter: Payer: Self-pay | Admitting: Obstetrics & Gynecology

## 2022-10-25 ENCOUNTER — Ambulatory Visit (INDEPENDENT_AMBULATORY_CARE_PROVIDER_SITE_OTHER): Payer: Medicare Other | Admitting: Obstetrics & Gynecology

## 2022-10-25 VITALS — BP 114/51 | HR 78 | Wt 167.0 lb

## 2022-10-25 DIAGNOSIS — N814 Uterovaginal prolapse, unspecified: Secondary | ICD-10-CM

## 2022-10-25 DIAGNOSIS — N393 Stress incontinence (female) (male): Secondary | ICD-10-CM | POA: Diagnosis not present

## 2022-10-25 NOTE — Progress Notes (Signed)
History:  72 y.o. G2P1011 here today for f/u of SUI and POP. Pt reports that she has decided not to have surgery. She saw Dr. Florian Buff and had a good experience however, she is not in a position to recover from surgery or take time off. She has decided to deal with her current situation. She doesn't want to reconsider the pessary. She reports that her leakage is limited now and she is managing the prolapse.  She uses the topical EES 2-3x/ week and feels that it is helpful. She denies bleeding since the pessary has been out.      The following portions of the patient's history were reviewed and updated as appropriate: allergies, current medications, past family history, past medical history, past social history, past surgical history and problem list.  Review of Systems:  Pertinent items are noted in HPI.    Objective:  Physical Exam Blood pressure (!) 114/51, pulse 78, weight 167 lb (75.8 kg).  CONSTITUTIONAL: Well-developed, well-nourished female in no acute distress.  HENT:  Normocephalic, atraumatic EYES: Conjunctivae and EOM are normal. No scleral icterus.  NECK: Normal range of motion SKIN: Skin is warm and dry. No rash noted. Not diaphoretic.No pallor. NEUROLGIC: Alert and oriented to person, place, and time. Normal coordination.  Pelvic: not performed  Assessment & Plan:  Diagnoses and all orders for this visit:  Stress incontinence in female  Uterovaginal prolapse   Pt would like to hold all treatment (surgery and pessary) for now. She wants to f/u annually and will call if something changes.   Pt declines mammogram. She reports that if she gets breast cancer she will deal with it at the time. This is not a new conversation.   F/u in 1 year or sooner prn  Total face-to-face time with patient, review of chart, discussion with consultant and coordination of care was .    Gordana L. Harraway-Smith, M.D., [facility]

## 2022-10-25 NOTE — Progress Notes (Signed)
Patient has seen uro gyn. Armandina Stammer RN

## 2022-10-26 ENCOUNTER — Ambulatory Visit (INDEPENDENT_AMBULATORY_CARE_PROVIDER_SITE_OTHER): Payer: Medicare Other | Admitting: *Deleted

## 2022-10-26 DIAGNOSIS — Z0000 Encounter for general adult medical examination without abnormal findings: Secondary | ICD-10-CM | POA: Diagnosis not present

## 2022-10-26 NOTE — Patient Instructions (Signed)
[name] , Thank you for taking time to come for your Medicare Wellness Visit. I appreciate your ongoing commitment to your health goals. Please review the following plan we discussed and let me know if I can assist you in the future.     This is a list of the screening recommended for you and due dates:  Health Maintenance  Topic Date Due   Mammogram  Never done   Flu Shot  12/14/2022   DTaP/Tdap/Td vaccine (2 - Td or Tdap) 05/16/2023   Medicare Annual Wellness Visit  10/26/2023   Cologuard (Stool DNA test)  07/23/2024   Pneumonia Vaccine  Completed   DEXA scan (bone density measurement)  Completed   COVID-19 Vaccine  Completed   Hepatitis C Screening  Completed   HPV Vaccine  Aged Out   Zoster (Shingles) Vaccine  Discontinued     Next appointment: Follow up in one year for your annual wellness visit.   Preventive Care 32 Years and Older, Female Preventive care refers to lifestyle choices and visits with your health care provider that can promote health and wellness. What does preventive care include? A yearly physical exam. This is also called an annual well check. Dental exams once or twice a year. Routine eye exams. Ask your health care provider how often you should have your eyes checked. Personal lifestyle choices, including: Daily care of your teeth and gums. Regular physical activity. Eating a healthy diet. Avoiding tobacco and drug use. Limiting alcohol use. Practicing safe sex. Taking low-dose aspirin every day. Taking vitamin and mineral supplements as recommended by your health care provider. What happens during an annual well check? The services and screenings done by your health care provider during your annual well check will depend on your age, overall health, lifestyle risk factors, and family history of disease. Counseling  Your health care provider may ask you questions about your: Alcohol use. Tobacco use. Drug use. Emotional well-being. Home and  relationship well-being. Sexual activity. Eating habits. History of falls. Memory and ability to understand (cognition). Work and work Astronomer. Reproductive health. Screening  You may have the following tests or measurements: Height, weight, and BMI. Blood pressure. Lipid and cholesterol levels. These may be checked every 5 years, or more frequently if you are over 85 years old. Skin check. Lung cancer screening. You may have this screening every year starting at age 40 if you have a 30-pack-year history of smoking and currently smoke or have quit within the past 15 years. Fecal occult blood test (FOBT) of the stool. You may have this test every year starting at age 59. Flexible sigmoidoscopy or colonoscopy. You may have a sigmoidoscopy every 5 years or a colonoscopy every 10 years starting at age 23. Hepatitis C blood test. Hepatitis B blood test. Sexually transmitted disease (STD) testing. Diabetes screening. This is done by checking your blood sugar (glucose) after you have not eaten for a while (fasting). You may have this done every 1-3 years. Bone density scan. This is done to screen for osteoporosis. You may have this done starting at age 52. Mammogram. This may be done every 1-2 years. Talk to your health care provider about how often you should have regular mammograms. Talk with your health care provider about your test results, treatment options, and if necessary, the need for more tests. Vaccines  Your health care provider may recommend certain vaccines, such as: Influenza vaccine. This is recommended every year. Tetanus, diphtheria, and acellular pertussis (Tdap, Td) vaccine.  You may need a Td booster every 10 years. Zoster vaccine. You may need this after age 21. Pneumococcal 13-valent conjugate (PCV13) vaccine. One dose is recommended after age 20. Pneumococcal polysaccharide (PPSV23) vaccine. One dose is recommended after age 44. Talk to your health care provider  about which screenings and vaccines you need and how often you need them. This information is not intended to replace advice given to you by your health care provider. Make sure you discuss any questions you have with your health care provider. Document Released: 05/28/2015 Document Revised: 01/19/2016 Document Reviewed: 03/02/2015 Elsevier Interactive Patient Education  2017 [employer].  Fall Prevention in the Home Falls can cause injuries. They can happen to people of all ages. There are many things you can do to make your home safe and to help prevent falls. What can I do on the outside of my home? Regularly fix the edges of walkways and driveways and fix any cracks. Remove anything that might make you trip as you walk through a door, such as a raised step or threshold. Trim any bushes or trees on the path to your home. Use bright outdoor lighting. Clear any walking paths of anything that might make someone trip, such as rocks or tools. Regularly check to see if handrails are loose or broken. Make sure that both sides of any steps have handrails. Any raised decks and porches should have guardrails on the edges. Have any leaves, snow, or ice cleared regularly. Use sand or salt on walking paths during winter. Clean up any spills in your garage right away. This includes oil or grease spills. What can I do in the bathroom? Use night lights. Install grab bars by the toilet and in the tub and shower. Do not use towel bars as grab bars. Use non-skid mats or decals in the tub or shower. If you need to sit down in the shower, use a plastic, non-slip stool. Keep the floor dry. Clean up any water that spills on the floor as soon as it happens. Remove soap buildup in the tub or shower regularly. Attach bath mats securely with double-sided non-slip rug tape. Do not have throw rugs and other things on the floor that can make you trip. What can I do in the bedroom? Use night lights. Make sure  that you have a light by your bed that is easy to reach. Do not use any sheets or blankets that are too big for your bed. They should not hang down onto the floor. Have a firm chair that has side arms. You can use this for support while you get dressed. Do not have throw rugs and other things on the floor that can make you trip. What can I do in the kitchen? Clean up any spills right away. Avoid walking on wet floors. Keep items that you use a lot in easy-to-reach places. If you need to reach something above you, use a strong step stool that has a grab bar. Keep electrical cords out of the way. Do not use floor polish or wax that makes floors slippery. If you must use wax, use non-skid floor wax. Do not have throw rugs and other things on the floor that can make you trip. What can I do with my stairs? Do not leave any items on the stairs. Make sure that there are handrails on both sides of the stairs and use them. Fix handrails that are broken or loose. Make sure that handrails are as long as  the stairways. Check any carpeting to make sure that it is firmly attached to the stairs. Fix any carpet that is loose or worn. Avoid having throw rugs at the top or bottom of the stairs. If you do have throw rugs, attach them to the floor with carpet tape. Make sure that you have a light switch at the top of the stairs and the bottom of the stairs. If you do not have them, ask someone to add them for you. What else can I do to help prevent falls? Wear shoes that: Do not have high heels. Have rubber bottoms. Are comfortable and fit you well. Are closed at the toe. Do not wear sandals. If you use a stepladder: Make sure that it is fully opened. Do not climb a closed stepladder. Make sure that both sides of the stepladder are locked into place. Ask someone to hold it for you, if possible. Clearly mark and make sure that you can see: Any grab bars or handrails. First and last steps. Where the edge of  each step is. Use tools that help you move around (mobility aids) if they are needed. These include: Canes. Walkers. Scooters. Crutches. Turn on the lights when you go into a dark area. Replace any light bulbs as soon as they burn out. Set up your furniture so you have a clear path. Avoid moving your furniture around. If any of your floors are uneven, fix them. If there are any pets around you, be aware of where they are. Review your medicines with your doctor. Some medicines can make you feel dizzy. This can increase your chance of falling. Ask your doctor what other things that you can do to help prevent falls. This information is not intended to replace advice given to you by your health care provider. Make sure you discuss any questions you have with your health care provider. Document Released: 02/25/2009 Document Revised: 10/07/2015 Document Reviewed: 06/05/2014 Elsevier Interactive Patient Education  2017 [employer].

## 2022-10-26 NOTE — Progress Notes (Signed)
Subjective:  Pt completed ADLs, Fall risk, and SDOH during e-check in on 10/19/22.  Answers verified with pt.    [name] is a 72 y.o. female who presents for Medicare Annual (Subsequent) preventive examination.  I connected with  [name] on 10/26/22 by a audio enabled telemedicine application and verified that I am speaking with the correct person using two identifiers.  Patient Location: Home  Provider Location: Office/Clinic  I discussed the limitations of evaluation and management by telemedicine. The patient expressed understanding and agreed to proceed.   Review of Systems     Cardiac Risk Factors include: advanced age (>17men, >14 women);dyslipidemia;obesity (BMI >30kg/m2)     Objective:    Today's Vitals   There is no height or weight on file to calculate BMI.     10/26/2022   10:21 AM 09/21/2021    4:25 PM 09/08/2021   11:35 AM 09/09/2020   12:42 PM 09/04/2019    8:53 AM 12/09/2018    1:32 PM 04/16/2018    1:00 AM  Advanced Directives  Does Patient Have a Medical Advance Directive? Yes Yes Yes Yes Yes Yes No  Type of Estate agent of [place];Living will Healthcare Power of [place];Living will Healthcare Power of [place];Living will Healthcare Power of [place];Living will Living will;Healthcare Power of [employer] Power of Attorney   Does patient want to make changes to medical advance directive? No - Patient declined No - Patient declined   No - Patient declined    Copy of Healthcare Power of Attorney in Chart? Yes - validated most recent copy scanned in chart (See row information) Yes - validated most recent copy scanned in chart (See row information) No - copy requested No - copy requested No - copy requested    Would patient like information on creating a medical advance directive?       Yes (Inpatient - patient requests chaplain consult to create a medical advance directive)    Current Medications (verified) Outpatient  Encounter Medications as of 10/26/2022  Medication Sig   acetaminophen (TYLENOL) 500 MG tablet Take 500 mg by mouth every 6 (six) hours as needed for mild pain or moderate pain.   acyclovir (ZOVIRAX) 400 MG tablet Take 1 tablet (400 mg total) by mouth 2 (two) times daily.   aspirin EC 81 MG EC tablet Take 1 tablet (81 mg total) by mouth daily.   atorvastatin (LIPITOR) 80 MG tablet Take 1 tablet (80 mg total) by mouth daily.   cholecalciferol (VITAMIN D3) 25 MCG (1000 UNIT) tablet Take 2,000 Units by mouth daily.   clobetasol cream (TEMOVATE) 0.05 % Apply 1 Application topically 2 (two) times daily. Apply to affected area twice a day   ezetimibe (ZETIA) 10 MG tablet Take 1 tablet (10 mg total) by mouth daily.   levothyroxine (SYNTHROID) 88 MCG tablet Take 1 tablet (88 mcg total) by mouth daily before breakfast.   lidocaine (XYLOCAINE) 2 % jelly Apply 1 Application topically as needed. (Patient taking differently: Apply 1 Application topically as needed (pain).)   metoprolol tartrate (LOPRESSOR) 25 MG tablet Take 0.5 tablets (12.5 mg total) by mouth 2 (two) times daily.   omeprazole (PRILOSEC) 20 MG capsule Take 1 capsule (20 mg total) by mouth daily.   No facility-administered encounter medications on file as of 10/26/2022.    Allergies (verified) Dye fdc blue  [fd&c blue #1 (brilliant blue)], Contrast media [iodinated contrast media], Povidone-iodine, and Scopolamine   History: Past Medical History:  Diagnosis  Date   Allergy 1979   Arthritis    Bilateral carotid bruits    1-39% bilateral ICA stenosis   Blood transfusion without reported diagnosis    Branch retinal vein occlusion of left eye with macular edema 01/09/2018   Cataract 2016   Bilateral cataract surgery 2017   Chicken pox    Closed fracture of left wrist 12/13/2018   Formatting of this note might be different from the original. Added automatically from request for surgery [id number]   Diverticulosis of colon 05/16/2015    Dyslipidemia    Genital herpes    GERD (gastroesophageal reflux disease)    Hepatitis B    HSV-2 (herpes simplex virus 2) infection 06/11/2016   Hyperlipidemia 1974   On meds   Hypothyroidism    Hypothyroidism (acquired) 06/11/2016   Kidney stone    NSTEMI (non-ST elevated myocardial infarction) (HCC) 04/15/2018   Troponin 0.58 on 04/15/18   Osteoarthritis 06/11/2016   Osteopenia 11/30/2017   Pancreatitis    Peripheral vascular disease, unspecified (HCC) right subclavian artery stenosis 04/18/2019   Prediabetes 03/11/2020   Pseudophakia of both eyes 02/18/2018   PVD (posterior vitreous detachment), bilateral 02/18/2018   S/P CABG x 4 04/15/18 04/17/2018   LIMA to LAD SVG to DIAGONAL SVG to OM SVG to RCA   Substance abuse (HCC) 1964   Clean and sober since 07/29/1989   Systolic murmur    Past Surgical History:  Procedure Laterality Date   APPENDECTOMY  1972   CORONARY ARTERY BYPASS GRAFT N/A 04/17/2018   Procedure: CORONARY ARTERY BYPASS GRAFTING (CABG) TIMES USING LEFT INTERNAL MAMMARY ARTERY TO LAD AND SAPHENOUS VEIN TO DIAG 1, OM1, AND RCA;  Surgeon: Delight Ovens, MD;  Location: [facility] OR;  Service: Open Heart Surgery;  Laterality: N/A;   ENDOVEIN HARVEST OF GREATER SAPHENOUS VEIN Right 04/17/2018   Procedure: ENDOVEIN HARVEST OF GREATER SAPHENOUS VEIN RIGHT THIGH AND CALF;  Surgeon: Delight Ovens, MD;  Location: [facility] OR;  Service: Open Heart Surgery;  Laterality: Right;   EYE SURGERY  11/2015   Bilateral cataract surgery   FRACTURE SURGERY  R clavicle fx 1989?   Repair failed   LAPAROSCOPY     LEFT HEART CATH AND CORONARY ANGIOGRAPHY N/A 04/16/2018   Procedure: LEFT HEART CATH AND CORONARY ANGIOGRAPHY;  Surgeon: Kathleene Hazel, MD;  Location: [facility] INVASIVE CV LAB;  Service: Cardiovascular;  Laterality: N/A;   OVARIAN CYST SURGERY     TEE WITHOUT CARDIOVERSION N/A 04/17/2018   Procedure: TRANSESOPHAGEAL ECHOCARDIOGRAM (TEE);  Surgeon: Delight Ovens, MD;   Location: [facility] OR;  Service: Open Heart Surgery;  Laterality: N/A;   TONSILLECTOMY     WRIST SURGERY Left 11/2018   Plate placement.   Family History  Problem Relation Age of Onset   Alcoholism Mother    Kidney disease Mother    Kidney Stones Mother    Liver cancer Mother    Cancer Mother    Vision loss Mother    Alcoholism Father    Hyperlipidemia Father    Heart disease Father    Heart failure Father    Alcohol abuse Father    Arthritis Father    Alcoholism Maternal Grandfather    Hearing loss Brother    Heart disease Brother    Hyperlipidemia Brother    Hypertension Brother    Social History   Socioeconomic History   Marital status: Divorced    Spouse name: Not on file   Number of children: Not on  file   Years of education: Not on file   Highest education level: Associate degree: occupational, technical, or vocational program  Occupational History   Not on file  Tobacco Use   Smoking status: Former    Packs/day: 3.00    Years: 28.00    Additional pack years: 0.00    Total pack years: 84.00    Types: Cigarettes    Quit date: 04/22/1991    Years since quitting: 31.5   Smokeless tobacco: Never  Vaping Use   Vaping Use: Never used  Substance and Sexual Activity   Alcohol use: Not Currently    Comment: Last drink 07/1989   Drug use: Not Currently    Types: Amphetamines, Barbituates, Benzodiazepines, "Crack" cocaine, Cocaine, Heroin, LSD, Methaqualone, Psilocybin   Sexual activity: Not Currently    Birth control/protection: Post-menopausal  Other Topics Concern   Not on file  Social History Narrative   Not on file   Social Determinants of Health   Financial Resource Strain: Low Risk  (10/19/2022)   Overall Financial Resource Strain (CARDIA)    Difficulty of Paying Living Expenses: Not hard at all  Food Insecurity: No Food Insecurity (10/19/2022)   Hunger Vital Sign    Worried About Running Out of Food in the Last Year: Never true    Ran Out of Food in the Last  Year: Never true  Transportation Needs: No Transportation Needs (10/19/2022)   PRAPARE - Administrator, Civil Service (Medical): No    Lack of Transportation (Non-Medical): No  Physical Activity: Insufficiently Active (10/19/2022)   Exercise Vital Sign    Days of Exercise per Week: 6 days    Minutes of Exercise per Session: 20 min  Stress: No Stress Concern Present (10/19/2022)   [employer] of Occupational Health - Occupational Stress Questionnaire    Feeling of Stress : Not at all  Social Connections: Unknown (10/19/2022)   Social Connection and Isolation Panel [NHANES]    Frequency of Communication with Friends and Family: More than three times a week    Frequency of Social Gatherings with Friends and Family: More than three times a week    Attends Religious Services: Patient declined    Database administrator or Organizations: No    Attends Engineer, structural: Never    Marital Status: Divorced    Tobacco Counseling Counseling given: Not Answered   Clinical Intake:  Pre-visit preparation completed: Yes  Pain : No/denies pain  BMI - recorded: 32.61 Nutritional Status: BMI > 30  Obese Nutritional Risks: None Diabetes: No  How often do you need to have someone help you when you read instructions, pamphlets, or other written materials from your doctor or pharmacy?: 1 - Never   Activities of Daily Living    10/19/2022    6:28 PM  In your present state of health, do you have any difficulty performing the following activities:  Hearing? 0  Vision? 0  Difficulty concentrating or making decisions? 0  Walking or climbing stairs? 0  Dressing or bathing? 0  Doing errands, shopping? 0  Preparing Food and eating ? N  Using the Toilet? N  In the past six months, have you accidently leaked urine? Y  Do you have problems with loss of bowel control? N  Managing your Medications? N  Managing your Finances? N  Housekeeping or managing your Housekeeping? N     Patient Care Team: Copland, Gwenlyn Found, MD as PCP - General (Family Medicine) Duke Salvia,  Elmarie Shiley, MD as PCP - Cardiology (Cardiology)  Indicate any recent Medical Services you may have received from other than [facility] providers in the past year (date may be approximate).     Assessment:   This is a routine wellness examination for Clay.  Hearing/Vision screen No results found.  Dietary issues and exercise activities discussed: Current Exercise Habits: Structured exercise class;Home exercise routine, Type of exercise: Other - see comments (Silver Sneakers, chair yoga), Time (Minutes): 60, Frequency (Times/Week): 3, Weekly Exercise (Minutes/Week): 180, Intensity: Mild, Exercise limited by: None identified   Goals Addressed   None    Depression Screen    10/26/2022   10:28 AM 08/28/2022    9:16 AM 09/08/2021   11:32 AM 09/09/2020   12:47 PM 09/04/2019    8:59 AM  PHQ 2/9 Scores  PHQ - 2 Score 0 0 0 0 0    Fall Risk    10/19/2022    6:28 PM 08/28/2022    9:16 AM 09/08/2021   11:35 AM 09/09/2020   12:46 PM 09/04/2019    8:59 AM  Fall Risk   Falls in the past year? 0 0 0 0 0  Number falls in past yr: 0 0 0 0 0  Injury with Fall? 0 0 0 0 0  Risk for fall due to : No Fall Risks No Fall Risks No Fall Risks    Follow up Falls evaluation completed Falls evaluation completed Falls prevention discussed Falls prevention discussed Education provided;Falls prevention discussed    FALL RISK PREVENTION PERTAINING TO THE HOME:  Any stairs in or around the home? Yes  If so, are there any without handrails? No  Home free of loose throw rugs in walkways, pet beds, electrical cords, etc? Yes  Adequate lighting in your home to reduce risk of falls? Yes   ASSISTIVE DEVICES UTILIZED TO PREVENT FALLS:  Life alert? No  Use of a cane, walker or w/c? No  Grab bars in the bathroom? No  Shower chair or bench in shower? No  Elevated toilet seat or a handicapped toilet? No   TIMED UP AND  GO:  Was the test performed?  No, audio visit .    Cognitive Function:        10/26/2022   10:39 AM 09/08/2021   11:42 AM 09/09/2020   12:56 PM  6CIT Screen  What Year? 0 points 0 points 0 points  What month? 0 points 0 points 0 points  What time? 0 points 0 points 0 points  Count back from 20 0 points 0 points 0 points  Months in reverse 0 points 0 points 0 points  Repeat phrase 0 points 0 points 0 points  Total Score 0 points 0 points 0 points    Immunizations Immunization History  Administered Date(s) Administered   COVID-19, mRNA, vaccine(Comirnaty)12 years and older 03/06/2022   Fluad Quad(high Dose 65+) 01/09/2019, 02/01/2021, 02/02/2022   Influenza-Unspecified 01/19/2017, 01/24/2018, 02/06/2020   PFIZER Comirnaty(Gray Top)Covid-19 Tri-Sucrose Vaccine 08/20/2020   PFIZER(Purple Top)SARS-COV-2 Vaccination 06/20/2019, 07/11/2019, 02/17/2020   Pfizer Covid-19 Vaccine Bivalent Booster 63yrs & up 03/04/2021   Pneumococcal Conjugate-13 05/15/2016   Pneumococcal Polysaccharide-23 05/15/2014   Tdap 05/15/2013    TDAP status: Up to date  Flu Vaccine status: Up to date  Pneumococcal vaccine status: Up to date  Covid-19 vaccine status: Completed vaccines  Qualifies for Shingles Vaccine? Yes   Zostavax completed No   Shingrix Completed?: No.    Education has been provided regarding the importance  of this vaccine. Patient has been advised to call insurance company to determine out of pocket expense if they have not yet received this vaccine. Advised may also receive vaccine at local pharmacy or Health Dept. Verbalized acceptance and understanding.  Screening Tests Health Maintenance  Topic Date Due   MAMMOGRAM  Never done   INFLUENZA VACCINE  12/14/2022   DTaP/Tdap/Td (2 - Td or Tdap) 05/16/2023   Medicare Annual Wellness (AWV)  10/26/2023   Fecal DNA (Cologuard)  07/23/2024   Pneumonia Vaccine 90+ Years old  Completed   DEXA SCAN  Completed   COVID-19 Vaccine   Completed   Hepatitis C Screening  Completed   HPV VACCINES  Aged Out   Zoster Vaccines- Shingrix  Discontinued    Health Maintenance  Health Maintenance Due  Topic Date Due   MAMMOGRAM  Never done    Colorectal cancer screening: Type of screening: Cologuard. Completed 07/23/21. Repeat every 3 years  Mammogram status: No longer required due to pt preference.  Bone Density status: Completed 11/29/17. Results reflect: Bone density results: OSTEOPENIA. Repeat every 2 years.  Lung Cancer Screening: (Low Dose CT Chest recommended if Age 71-80 years, 30 pack-year currently smoking OR have quit w/in 15years.) does not qualify.    Additional Screening:  Hepatitis C Screening: does qualify; Completed 02/04/20  Vision Screening: Recommended annual ophthalmology exams for early detection of glaucoma and other disorders of the eye. Is the patient up to date with their annual eye exam?  Yes  Who is the provider or what is the name of the office in which the patient attends annual eye exams? Dr. Severiano Gilbert If pt is not established with a provider, would they like to be referred to a provider to establish care? No .   Dental Screening: Recommended annual dental exams for proper oral hygiene  Community Resource Referral / Chronic Care Management: CRR required this visit?  No   CCM required this visit?  No      Plan:     I have personally reviewed and noted the following in the patient's chart:   Medical and social history Use of alcohol, tobacco or illicit drugs  Current medications and supplements including opioid prescriptions. Patient is not currently taking opioid prescriptions. Functional ability and status Nutritional status Physical activity Advanced directives List of other physicians Hospitalizations, surgeries, and ER visits in previous 12 months Vitals Screenings to include cognitive, depression, and falls Referrals and appointments  In addition, I have reviewed and  discussed with patient certain preventive protocols, quality metrics, and best practice recommendations. A written personalized care plan for preventive services as well as general preventive health recommendations were provided to patient.   Due to this being a telephonic visit, the after visit summary with patients personalized plan was offered to patient via mail or my-chart. Patient would like to access on my-chart.   Donne Anon, [place]   10/26/2022   Nurse Notes: None

## 2022-12-01 ENCOUNTER — Encounter (INDEPENDENT_AMBULATORY_CARE_PROVIDER_SITE_OTHER): Payer: Medicare Other | Admitting: Family Medicine

## 2022-12-01 DIAGNOSIS — R300 Dysuria: Secondary | ICD-10-CM

## 2022-12-01 NOTE — Telephone Encounter (Signed)
Okay for orders and lab visit? Pt was last seen in April.

## 2022-12-01 NOTE — Telephone Encounter (Signed)

## 2022-12-04 ENCOUNTER — Encounter: Payer: Self-pay | Admitting: Family Medicine

## 2022-12-04 ENCOUNTER — Other Ambulatory Visit (INDEPENDENT_AMBULATORY_CARE_PROVIDER_SITE_OTHER): Payer: Medicare Other

## 2022-12-04 DIAGNOSIS — R300 Dysuria: Secondary | ICD-10-CM | POA: Diagnosis not present

## 2022-12-04 LAB — POCT URINALYSIS DIP (MANUAL ENTRY)
Bilirubin, UA: NEGATIVE
Blood, UA: NEGATIVE
Glucose, UA: NEGATIVE mg/dL
Ketones, POC UA: NEGATIVE mg/dL
Nitrite, UA: NEGATIVE
Spec Grav, UA: <=1.005 — AB (ref 1.010–1.025)
Urobilinogen, UA: 0.2 E.U./dL
pH, UA: 6 (ref 5.0–8.0)

## 2022-12-04 LAB — URINALYSIS, MICROSCOPIC ONLY

## 2022-12-04 NOTE — Progress Notes (Signed)
Specimen drop off

## 2022-12-05 ENCOUNTER — Other Ambulatory Visit: Payer: Self-pay | Admitting: Cardiology

## 2022-12-05 LAB — URINE CULTURE: MICRO NUMBER:: 15229043

## 2022-12-06 ENCOUNTER — Encounter: Payer: Self-pay | Admitting: Family Medicine

## 2022-12-06 DIAGNOSIS — K219 Gastro-esophageal reflux disease without esophagitis: Secondary | ICD-10-CM

## 2022-12-06 MED ORDER — OMEPRAZOLE 20 MG PO CPDR
20.0000 mg | DELAYED_RELEASE_CAPSULE | Freq: Every day | ORAL | 3 refills | Status: DC
Start: 2022-12-06 — End: 2023-10-03

## 2022-12-06 MED ORDER — NITROFURANTOIN MONOHYD MACRO 100 MG PO CAPS: 100.0000 mg | ORAL_CAPSULE | Freq: Two times a day (BID) | ORAL | 0 refills | Status: DC

## 2022-12-06 NOTE — Telephone Encounter (Signed)
Rx refill sent to pharmacy. 

## 2022-12-07 ENCOUNTER — Encounter: Payer: Self-pay | Admitting: Family Medicine

## 2022-12-07 LAB — URINE CULTURE: SPECIMEN QUALITY:: ADEQUATE

## 2022-12-27 ENCOUNTER — Encounter: Payer: Self-pay | Admitting: Obstetrics and Gynecology

## 2023-01-01 ENCOUNTER — Telehealth: Payer: Self-pay

## 2023-01-01 ENCOUNTER — Encounter: Payer: Self-pay | Admitting: Obstetrics and Gynecology

## 2023-01-01 NOTE — Telephone Encounter (Signed)
   Pre-operative Risk Assessment    Patient Name: [name]  DOB: 05-Jul-1950 MRN: [id number]   Pts last OV was 09/12/22 with Dr. Bing Matter. Pt does not have a follow up appt scheduled.    Request for Surgical Clearance    Procedure:   Reconstructive Surgery  Date of Surgery:  Clearance TBD                                 Surgeon:  Lanetta Inch, MD Surgeon's Group or Practice Name:  [facility]  Phone number:  [phone] Fax number:  [phone]   Type of Clearance Requested:   - Medical  - Pharmacy:  Hold Aspirin pt will need instructions on when/if to hold   Type of Anesthesia:  General    Additional requests/questions:    SignedZada Finders   01/01/2023, 3:22 PM

## 2023-01-01 NOTE — Telephone Encounter (Signed)
Preoperative team, we are unable to provide blanket coverage.  Please contact requesting office and ask for details surrounding upcoming surgery.  Once we receive details surrounding surgery we will be able to provide recommendations.  Thank you for your help.  Thomasene Ripple. Kismet Facemire NP-C     01/01/2023, 3:48 PM [facility] [address] Office [phone] Fax [phone]

## 2023-01-01 NOTE — Telephone Encounter (Signed)
I lvm asking for clarification on exactly what type of surgery.

## 2023-01-02 ENCOUNTER — Telehealth: Payer: Self-pay

## 2023-01-02 NOTE — Telephone Encounter (Signed)
   Name: [name]  DOB: [birth year]  MRN: [id number]  Primary Cardiologist: Chilton Si, MD   Preoperative team, please contact this patient and set up a phone call appointment for further preoperative risk assessment. Please obtain consent and complete medication review. Thank you for your help.  I confirm that guidance regarding antiplatelet and oral anticoagulation therapy has been completed and, if necessary, noted below.  If needed her aspirin may be held for 5 to 7 days prior to her procedure.  Please resume as soon as hemostasis is achieved.   Ronney Asters, NP 01/02/2023, 1:32 PM [facility]

## 2023-01-02 NOTE — Telephone Encounter (Signed)
  Patient Consent for Virtual Visit         [name] has provided verbal consent on 01/02/2023 for a virtual visit (video or telephone).   CONSENT FOR VIRTUAL VISIT FOR:  [name]  By participating in this virtual visit I agree to the following:  I hereby voluntarily request, consent and authorize [facility] and its employed or contracted physicians, physician assistants, nurse practitioners or other licensed health care professionals (the Practitioner), to provide me with telemedicine health care services (the "Services") as deemed necessary by the treating Practitioner. I acknowledge and consent to receive the Services by the Practitioner via telemedicine. I understand that the telemedicine visit will involve communicating with the Practitioner through live audiovisual communication technology and the disclosure of certain medical information by electronic transmission. I acknowledge that I have been given the opportunity to request an in-person assessment or other available alternative prior to the telemedicine visit and am voluntarily participating in the telemedicine visit.  I understand that I have the right to withhold or withdraw my consent to the use of telemedicine in the course of my care at any time, without affecting my right to future care or treatment, and that the Practitioner or I may terminate the telemedicine visit at any time. I understand that I have the right to inspect all information obtained and/or recorded in the course of the telemedicine visit and may receive copies of available information for a reasonable fee.  I understand that some of the potential risks of receiving the Services via telemedicine include:  Delay or interruption in medical evaluation due to technological equipment failure or disruption; Information transmitted may not be sufficient (e.g. poor resolution of images) to allow for appropriate medical decision making by the  Practitioner; and/or  In rare instances, security protocols could fail, causing a breach of personal health information.  Furthermore, I acknowledge that it is my responsibility to provide information about my medical history, conditions and care that is complete and accurate to the best of my ability. I acknowledge that Practitioner's advice, recommendations, and/or decision may be based on factors not within their control, such as incomplete or inaccurate data provided by me or distortions of diagnostic images or specimens that may result from electronic transmissions. I understand that the practice of medicine is not an exact science and that Practitioner makes no warranties or guarantees regarding treatment outcomes. I acknowledge that a copy of this consent can be made available to me via my patient portal [facility] MyChart), or I can request a printed copy by calling the office of [facility].    I understand that my insurance will be billed for this visit.   I have read or had this consent read to me. I understand the contents of this consent, which adequately explains the benefits and risks of the Services being provided via telemedicine.  I have been provided ample opportunity to ask questions regarding this consent and the Services and have had my questions answered to my satisfaction. I give my informed consent for the services to be provided through the use of telemedicine in my medical care

## 2023-01-02 NOTE — Telephone Encounter (Signed)
Hello,  My clearance request states its a vaginal repair for her prolapse and incontinence- is this not sufficient? This is general language that is in all of my clearance request letters, so it has not been an issue previously. She is planning a colpocleisis procedure and a sling.  Thanks,  Lanetta Inch

## 2023-01-02 NOTE — Telephone Encounter (Signed)
Pt scheduled for tele visit on 01/11/23. Med rec and consent done

## 2023-01-02 NOTE — Telephone Encounter (Signed)
I will fax over a note asking for clarification. We cannot take a clearance request that just states reconstructive surgery. Cardiologist and pre op APP will need clarification to the actual procedure to be done. Please fax over a new request with complete information for procedure or may call the pre op line [phone], ok to leave vm if needed.

## 2023-01-11 ENCOUNTER — Ambulatory Visit: Payer: Medicare Other | Attending: Cardiology | Admitting: Nurse Practitioner

## 2023-01-11 DIAGNOSIS — Z01810 Encounter for preprocedural cardiovascular examination: Secondary | ICD-10-CM | POA: Diagnosis not present

## 2023-01-11 NOTE — Progress Notes (Signed)
Virtual Visit via Telephone Note   Because of [name]'s co-morbid illnesses, she is at least at moderate risk for complications without adequate follow up.  This format is felt to be most appropriate for this patient at this time.  The patient did not have access to video technology/had technical difficulties with video requiring transitioning to audio format only (telephone).  All issues noted in this document were discussed and addressed.  No physical exam could be performed with this format.  Please refer to the patient's chart for her consent to telehealth for [facility].  Evaluation Performed:  Preoperative cardiovascular risk assessment _____________   Date:  01/11/2023   Patient ID:  [name], DOB December 02, 1950, MRN [id number] Patient Location:  Home Provider location:   Office  Primary Care Provider:  Pearline Cables, MD Primary Cardiologist:  Chilton Si, MD  Chief Complaint / Patient Profile   72 y.o. y/o female with a h/o CAD s/p CABG x4 in 2019, PVD, R subclavian steal, hypertension, hyperlipidemia, and prediabetes who is pending colpocleisis procedure and a sling with Dr. Lanetta Inch of [facility] and presents today for telephonic preoperative cardiovascular risk assessment.  History of Present Illness    [name] is a 72 y.o. female who presents via audio/video conferencing for a telehealth visit today.  Pt was last seen in cardiology clinic on 09/12/2022 by Dr. Janyce Llanos. At that time Sontee Roundtree was doing well.  The patient is now pending procedure as outlined above. Since her last visit, she has done well from a cardiac standpoint.   She denies chest pain, palpitations, dyspnea, pnd, orthopnea, n, v, dizziness, syncope, edema, weight gain, or early satiety. All other systems reviewed and are otherwise negative except as noted above.   Past Medical History    Past Medical History:  Diagnosis Date   Allergy  1979   Arthritis    Bilateral carotid bruits    1-39% bilateral ICA stenosis   Blood transfusion without reported diagnosis    Branch retinal vein occlusion of left eye with macular edema 01/09/2018   Cataract 2016   Bilateral cataract surgery 2017   Chicken pox    Closed fracture of left wrist 12/13/2018   Formatting of this note might be different from the original. Added automatically from request for surgery [id number]   Diverticulosis of colon 05/16/2015   Dyslipidemia    Genital herpes    GERD (gastroesophageal reflux disease)    Hepatitis B    HSV-2 (herpes simplex virus 2) infection 06/11/2016   Hyperlipidemia 1974   On meds   Hypothyroidism    Hypothyroidism (acquired) 06/11/2016   Kidney stone    NSTEMI (non-ST elevated myocardial infarction) (HCC) 04/15/2018   Troponin 0.58 on 04/15/18   Osteoarthritis 06/11/2016   Osteopenia 11/30/2017   Pancreatitis    Peripheral vascular disease, unspecified (HCC) right subclavian artery stenosis 04/18/2019   Prediabetes 03/11/2020   Pseudophakia of both eyes 02/18/2018   PVD (posterior vitreous detachment), bilateral 02/18/2018   S/P CABG x 4 04/15/18 04/17/2018   LIMA to LAD SVG to DIAGONAL SVG to OM SVG to RCA   Substance abuse (HCC) 1964   Clean and sober since 07/29/1989   Systolic murmur    Past Surgical History:  Procedure Laterality Date   APPENDECTOMY  1972   CORONARY ARTERY BYPASS GRAFT N/A 04/17/2018   Procedure: CORONARY ARTERY BYPASS GRAFTING (CABG) TIMES USING LEFT INTERNAL MAMMARY ARTERY TO LAD AND SAPHENOUS VEIN TO DIAG  1, OM1, AND RCA;  Surgeon: Delight Ovens, MD;  Location: [facility] OR;  Service: Open Heart Surgery;  Laterality: N/A;   ENDOVEIN HARVEST OF GREATER SAPHENOUS VEIN Right 04/17/2018   Procedure: ENDOVEIN HARVEST OF GREATER SAPHENOUS VEIN RIGHT THIGH AND CALF;  Surgeon: Delight Ovens, MD;  Location: [facility] OR;  Service: Open Heart Surgery;  Laterality: Right;   EYE SURGERY  11/2015   Bilateral cataract  surgery   FRACTURE SURGERY  R clavicle fx 1989?   Repair failed   LAPAROSCOPY     LEFT HEART CATH AND CORONARY ANGIOGRAPHY N/A 04/16/2018   Procedure: LEFT HEART CATH AND CORONARY ANGIOGRAPHY;  Surgeon: Kathleene Hazel, MD;  Location: [facility] INVASIVE CV LAB;  Service: Cardiovascular;  Laterality: N/A;   OVARIAN CYST SURGERY     TEE WITHOUT CARDIOVERSION N/A 04/17/2018   Procedure: TRANSESOPHAGEAL ECHOCARDIOGRAM (TEE);  Surgeon: Delight Ovens, MD;  Location: [facility] OR;  Service: Open Heart Surgery;  Laterality: N/A;   TONSILLECTOMY     WRIST SURGERY Left 11/2018   Plate placement.    Allergies  Allergies  Allergen Reactions   Dye Fdc Blue  [Fd&C Blue #1 (Brilliant Blue)] Anaphylaxis    Dye used for CAT scans   Contrast Media [Iodinated Contrast Media]    Povidone-Iodine Dermatitis, Rash and Swelling   Scopolamine Anxiety and Other (See Comments)    Home Medications    Prior to Admission medications   Medication Sig Start Date End Date Taking? Authorizing Provider  acetaminophen (TYLENOL) 500 MG tablet Take 500 mg by mouth every 6 (six) hours as needed for mild pain or moderate pain.    [provider]  acyclovir (ZOVIRAX) 400 MG tablet Take 1 tablet (400 mg total) by mouth 2 (two) times daily. 09/05/22   Copland, Gwenlyn Found, MD  aspirin EC 81 MG EC tablet Take 1 tablet (81 mg total) by mouth daily. 09/22/19   Georgeanna Lea, MD  atorvastatin (LIPITOR) 80 MG tablet Take 1 tablet (80 mg total) by mouth daily. 04/26/22   Georgeanna Lea, MD  cholecalciferol (VITAMIN D3) 25 MCG (1000 UNIT) tablet Take 2,000 Units by mouth daily.    [provider]  ezetimibe (ZETIA) 10 MG tablet Take 1 tablet (10 mg total) by mouth daily. 01/09/22 01/04/23  Georgeanna Lea, MD  levothyroxine (SYNTHROID) 88 MCG tablet Take 1 tablet (88 mcg total) by mouth daily before breakfast. 08/28/22   Copland, Gwenlyn Found, MD  metoprolol tartrate (LOPRESSOR) 25 MG tablet Take 0.5 tablets  (12.5 mg total) by mouth 2 (two) times daily. 12/06/22   Georgeanna Lea, MD  nitrofurantoin, macrocrystal-monohydrate, (MACROBID) 100 MG capsule Take 1 capsule (100 mg total) by mouth 2 (two) times daily. Patient not taking: Reported on 01/02/2023 12/06/22   Copland, Gwenlyn Found, MD  omeprazole (PRILOSEC) 20 MG capsule Take 1 capsule (20 mg total) by mouth daily. 12/06/22   Copland, Gwenlyn Found, MD    Physical Exam    Vital Signs:  Marcella Bravata does not have vital signs available for review today.  Given telephonic nature of communication, physical exam is limited. AAOx3. NAD. Normal affect.  Speech and respirations are unlabored.  Accessory Clinical Findings    None  Assessment & Plan    1.  Preoperative Cardiovascular Risk Assessment:  According to the Revised Cardiac Risk Index (RCRI), her Perioperative Risk of Major Cardiac Event is (%): 0.9. Her Functional Capacity in METs is: 7.34 according to the [facility] Activity Status  Index (DASI). Therefore, based on ACC/AHA guidelines, patient would be at acceptable risk for the planned procedure without further cardiovascular testing. I will route this recommendation to the requesting party via Epic fax function.  The patient was advised that if she develops new symptoms prior to surgery to contact our office to arrange for a follow-up visit, and she verbalized understanding.  Per office protocol, she may hold Aspirin for 5-7 days prior to procedure. Please resume Aspirin as soon as possible postprocedure, at the discretion of the surgeon.    A copy of this note will be routed to requesting surgeon.  Time:   Today, I have spent 6 minutes with the patient with telehealth technology discussing medical history, symptoms, and management plan.     Joylene Grapes, NP  01/11/2023, 9:50 AM

## 2023-01-25 ENCOUNTER — Telehealth: Payer: Self-pay | Admitting: Cardiovascular Disease

## 2023-01-25 MED ORDER — EZETIMIBE 10 MG PO TABS: 10.0000 mg | ORAL_TABLET | Freq: Every day | ORAL | 3 refills | Status: DC

## 2023-01-25 MED ORDER — ATORVASTATIN CALCIUM 80 MG PO TABS: 80.0000 mg | ORAL_TABLET | Freq: Every day | ORAL | 3 refills | Status: DC

## 2023-01-25 NOTE — Telephone Encounter (Signed)
*  STAT* If patient is at the pharmacy, call can be transferred to refill team.   1. Which medications need to be refilled? (please list name of each medication and dose if known)   atorvastatin (LIPITOR) 80 MG tablet    ezetimibe (ZETIA) 10 MG tablet (Expired)   2. Which pharmacy/location (including street and city if local pharmacy) is medication to be sent to?[email or web address] - [place], [place] - [address]   3. Do they need a 30 day or 90 day supply? 90 day

## 2023-02-05 ENCOUNTER — Other Ambulatory Visit (HOSPITAL_BASED_OUTPATIENT_CLINIC_OR_DEPARTMENT_OTHER): Payer: Self-pay

## 2023-02-05 DIAGNOSIS — H43813 Vitreous degeneration, bilateral: Secondary | ICD-10-CM | POA: Diagnosis not present

## 2023-02-05 DIAGNOSIS — H52203 Unspecified astigmatism, bilateral: Secondary | ICD-10-CM | POA: Diagnosis not present

## 2023-02-05 DIAGNOSIS — H02834 Dermatochalasis of left upper eyelid: Secondary | ICD-10-CM | POA: Diagnosis not present

## 2023-02-05 DIAGNOSIS — H35033 Hypertensive retinopathy, bilateral: Secondary | ICD-10-CM | POA: Diagnosis not present

## 2023-02-05 DIAGNOSIS — Z23 Encounter for immunization: Secondary | ICD-10-CM | POA: Diagnosis not present

## 2023-02-05 DIAGNOSIS — H18413 Arcus senilis, bilateral: Secondary | ICD-10-CM | POA: Diagnosis not present

## 2023-02-05 DIAGNOSIS — Z961 Presence of intraocular lens: Secondary | ICD-10-CM | POA: Diagnosis not present

## 2023-02-05 DIAGNOSIS — H524 Presbyopia: Secondary | ICD-10-CM | POA: Diagnosis not present

## 2023-02-05 DIAGNOSIS — H34832 Tributary (branch) retinal vein occlusion, left eye, with macular edema: Secondary | ICD-10-CM | POA: Diagnosis not present

## 2023-02-05 DIAGNOSIS — H02831 Dermatochalasis of right upper eyelid: Secondary | ICD-10-CM | POA: Diagnosis not present

## 2023-02-05 MED ORDER — INFLUENZA VAC A&B SURF ANT ADJ 0.5 ML IM SUSY
0.5000 mL | PREFILLED_SYRINGE | Freq: Once | INTRAMUSCULAR | 0 refills | Status: AC
  Filled 2023-02-05: qty 0.5, 1d supply, fill #0

## 2023-02-05 MED ORDER — COVID-19 MRNA VAC-TRIS(PFIZER) 30 MCG/0.3ML IM SUSY
0.3000 mL | PREFILLED_SYRINGE | Freq: Once | INTRAMUSCULAR | 0 refills | Status: AC
  Filled 2023-02-05: qty 0.3, 1d supply, fill #0

## 2023-02-21 ENCOUNTER — Encounter: Payer: Self-pay | Admitting: Family Medicine

## 2023-02-21 ENCOUNTER — Other Ambulatory Visit (INDEPENDENT_AMBULATORY_CARE_PROVIDER_SITE_OTHER): Payer: Medicare Other

## 2023-02-21 DIAGNOSIS — R300 Dysuria: Secondary | ICD-10-CM

## 2023-02-21 LAB — POCT URINALYSIS DIP (MANUAL ENTRY)
Blood, UA: NEGATIVE
Glucose, UA: 100 mg/dL — AB
Nitrite, UA: POSITIVE — AB
Protein Ur, POC: NEGATIVE mg/dL
Spec Grav, UA: <=1.005 — AB (ref 1.010–1.025)
Urobilinogen, UA: 0.2 U/dL
pH, UA: 6 (ref 5.0–8.0)

## 2023-02-21 MED ORDER — NITROFURANTOIN MONOHYD MACRO 100 MG PO CAPS: 100.0000 mg | ORAL_CAPSULE | Freq: Two times a day (BID) | ORAL | 0 refills | Status: DC

## 2023-02-21 NOTE — Addendum Note (Signed)
Addended by: Abbe Amsterdam C on: 02/21/2023 12:17 PM   Modules accepted: Orders

## 2023-02-22 LAB — URINE CULTURE
MICRO NUMBER:: 15572569
Result:: NO GROWTH
SPECIMEN QUALITY:: ADEQUATE

## 2023-02-23 ENCOUNTER — Encounter: Payer: Self-pay | Admitting: Family Medicine

## 2023-03-01 ENCOUNTER — Encounter: Payer: Self-pay | Admitting: Obstetrics and Gynecology

## 2023-03-01 ENCOUNTER — Ambulatory Visit: Payer: Medicare Other | Admitting: Obstetrics and Gynecology

## 2023-03-01 VITALS — BP 145/79 | HR 79

## 2023-03-01 DIAGNOSIS — Z01818 Encounter for other preprocedural examination: Secondary | ICD-10-CM

## 2023-03-01 MED ORDER — GABAPENTIN 100 MG PO CAPS
100.0000 mg | ORAL_CAPSULE | Freq: Three times a day (TID) | ORAL | 0 refills | Status: DC
Start: 2023-03-01 — End: 2023-03-29

## 2023-03-01 NOTE — Progress Notes (Signed)
[facility] Pre-Operative Exam  Subjective Chief Complaint: [name] presents for a preoperative encounter.   History of Present Illness: [name] is a 72 y.o. female who presents for preoperative visit.  She is scheduled to undergo Exam under anesthesia, le forte colpocleisis, posterior repair and perineorrhaphy, urethral bulking, cystoscopy  on 03/19/23.  Her symptoms include pelvic organ prolapse and stress urinary incontinence, and she was was found to have Stage III anterior, Stage I posterior, Stage II apical prolapse.   Urodynamics showed: Urodynamic Impression:  1. Sensation was reduced; capacity was normal 2. Stress Incontinence was demonstrated at normal pressures; 3. Detrusor Overactivity was demonstrated without leakage. 4. Emptying was normal with a normal PVR, a sustained detrusor contraction present,  abdominal straining not present, normal urethral sphincter activity on EMG.  Past Medical History:  Diagnosis Date   Allergy 1979   Arthritis    Bilateral carotid bruits    1-39% bilateral ICA stenosis   Blood transfusion without reported diagnosis    Branch retinal vein occlusion of left eye with macular edema 01/09/2018   Cataract 2016   Bilateral cataract surgery 2017   Chicken pox    Closed fracture of left wrist 12/13/2018   Formatting of this note might be different from the original. Added automatically from request for surgery [id number]   Diverticulosis of colon 05/16/2015   Dyslipidemia    Genital herpes    GERD (gastroesophageal reflux disease)    Hepatitis B    HSV-2 (herpes simplex virus 2) infection 06/11/2016   Hyperlipidemia 1974   On meds   Hypothyroidism    Hypothyroidism (acquired) 06/11/2016   Kidney stone    NSTEMI (non-ST elevated myocardial infarction) (HCC) 04/15/2018   Troponin 0.58 on 04/15/18   Osteoarthritis 06/11/2016   Osteopenia 11/30/2017   Pancreatitis    Peripheral vascular disease, unspecified (HCC) right  subclavian artery stenosis 04/18/2019   Prediabetes 03/11/2020   Pseudophakia of both eyes 02/18/2018   PVD (posterior vitreous detachment), bilateral 02/18/2018   S/P CABG x 4 04/15/18 04/17/2018   LIMA to LAD SVG to DIAGONAL SVG to OM SVG to RCA   Substance abuse (HCC) 1964   Clean and sober since 07/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   Systolic murmur      Past Surgical History:  Procedure Laterality Date   APPENDECTOMY  1972   CORONARY ARTERY BYPASS GRAFT N/A 04/17/2018   Procedure: CORONARY ARTERY BYPASS GRAFTING (CABG) TIMES USING LEFT INTERNAL MAMMARY ARTERY TO LAD AND SAPHENOUS VEIN TO DIAG 1, OM1, AND RCA;  Surgeon: Delight Ovens, MD;  Location: [facility] OR;  Service: Open Heart Surgery;  Laterality: N/A;   ENDOVEIN HARVEST OF GREATER SAPHENOUS VEIN Right 04/17/2018   Procedure: ENDOVEIN HARVEST OF GREATER SAPHENOUS VEIN RIGHT THIGH AND CALF;  Surgeon: Delight Ovens, MD;  Location: [facility] OR;  Service: Open Heart Surgery;  Laterality: Right;   EYE SURGERY  11/2015   Bilateral cataract surgery   FRACTURE SURGERY  R clavicle fx 1989?   Repair failed   LAPAROSCOPY     LEFT HEART CATH AND CORONARY ANGIOGRAPHY N/A 04/16/2018   Procedure: LEFT HEART CATH AND CORONARY ANGIOGRAPHY;  Surgeon: Kathleene Hazel, MD;  Location: [facility] INVAS[facility] LAB;  Service: Cardiovascular;  Laterality: N/A;   OVARIAN CYST SURGERY     TEE WITHOUT CARDIOVERSION N/A 04/17/2018   Procedure: TRANSESOPHAGEAL ECHOCARDIOGRAM (TEE);  Surgeon: Delight Ovens, MD;  Location: [facility] OR;  Service: Open Heart Surgery;  Laterality: N/A;   TONSILLECTOMY  WRIST SURGERY Left 11/2018   Plate placement.    is allergic to other, contrast media [iodinated contrast media], povidone-iodine, and scopolamine.   Family History  Problem Relation Age of Onset   Alcoholism Mother    Kidney disease Mother    Kidney Stones Mother    Liver cancer Mother    Cancer Mother    Vision loss Mother    Alcoholism Father    Hyperlipidemia Father     Heart disease Father    Heart failure Father    Alcohol abuse Father    Arthritis Father    Alcoholism Maternal Grandfather    Hearing loss Brother    Heart disease Brother    Hyperlipidemia Brother    Hypertension Brother     Social History   Tobacco Use   Smoking status: Former    Current packs/day: 0.00    Average packs/day: 3.0 packs/day for 28.0 years (84.0 ttl pk-yrs)    Types: Cigarettes    Start date: 04/22/1963    Quit date: 04/22/1991    Years since quitting: 31.8   Smokeless tobacco: Never  Vaping Use   Vaping status: Never Used  Substance Use Topics   Alcohol use: Not Currently    Comment: Last drink 07/1989   Drug use: Not Currently    Types: Amphetamines, Barbituates, Benzodiazepines, "Crack" cocaine, Cocaine, Heroin, LSD, Methaqualone, Psilocybin     Review of Systems was negative for a full 10 system review except as noted in the History of Present Illness.   Current Outpatient Medications:    acetaminophen (TYLENOL) 500 MG tablet, Take 500 mg by mouth every 6 (six) hours as needed for mild pain or moderate pain., Disp: , Rfl:    acyclovir (ZOVIRAX) 400 MG tablet, Take 1 tablet (400 mg total) by mouth 2 (two) times daily., Disp: 180 tablet, Rfl: 3   aspirin EC 81 MG EC tablet, Take 1 tablet (81 mg total) by mouth daily., Disp: , Rfl:    atorvastatin (LIPITOR) 80 MG tablet, Take 1 tablet (80 mg total) by mouth daily., Disp: 90 tablet, Rfl: 3   cholecalciferol (VITAMIN D3) 25 MCG (1000 UNIT) tablet, Take 2,000 Units by mouth daily., Disp: , Rfl:    ezetimibe (ZETIA) 10 MG tablet, Take 1 tablet (10 mg total) by mouth daily., Disp: 90 tablet, Rfl: 3   levothyroxine (SYNTHROID) 88 MCG tablet, Take 1 tablet (88 mcg total) by mouth daily before breakfast., Disp: 90 tablet, Rfl: 3   metoprolol tartrate (LOPRESSOR) 25 MG tablet, Take 0.5 tablets (12.5 mg total) by mouth 2 (two) times daily., Disp: 90 tablet, Rfl: 2   nitrofurantoin, macrocrystal-monohydrate, (MACROBID)  100 MG capsule, Take 1 capsule (100 mg total) by mouth 2 (two) times daily., Disp: 14 capsule, Rfl: 0   omeprazole (PRILOSEC) 20 MG capsule, Take 1 capsule (20 mg total) by mouth daily., Disp: 90 capsule, Rfl: 3   Objective Vitals:   03/01/23 1308  BP: (!) 145/79  Pulse: 79    Gen: NAD CV: S1 S2 RRR Lungs: Clear to auscultation bilaterally Abd: soft, nontender   Previous Pelvic Exam showed: Normal external female genitalia; Bartholin's and Skene's glands normal in appearance; urethral meatus normal in appearance, no urethral masses or discharge.    CST: negative   Speculum exam reveals normal vaginal mucosa with atrophy. Cervix normal appearance. Uterus normal single, nontender. Adnexa no mass, fullness, tenderness.     Pelvic floor strength I/V, puborectalis III/V external anal sphincter III/V   Pelvic  floor musculature: Right levator non-tender, Right obturator non-tender, Left levator non-tender, Left obturator non-tender   POP-Q:    POP-Q   2                                            Aa   2                                           Ba   0                                              C    4                                            Gh   3.5                                            Pb   6                                            tvl    -2                                            Ap   -2                                            Bp   -3                                              D          Rectal Exam:  Normal sphincter tone, small distal rectocele, enterocoele not present, no rectal masses, no sign of dyssynergia when asking the patient to bear down.    Assessment/ Plan  Assessment: The patient is a 72 y.o. year old scheduled to undergo Exam under anesthesia, le forte colpocleisis, posterior repair and perineorrhaphy, urethral bulking, cystoscopy. Verbal consent was obtained for these procedures.  Plan: General Surgical Consent: The  patient has previously been counseled on alternative treatments, and the decision by the patient and provider was to proceed with the procedure listed above.  For all procedures, there are risks of bleeding, infection, damage to surrounding organs including but not limited to bowel, bladder, blood vessels, ureters and nerves, and need for further surgery if an injury were to occur. These risks are all low with minimally invasive surgery.  There are risks of numbness and weakness at any body site or buttock/rectal pain.  It is possible that baseline pain can be worsened by surgery, either with or without mesh. If surgery is vaginal, there is also a low risk of possible conversion to laparoscopy or open abdominal incision where indicated. Very rare risks include blood transfusion, blood clot, heart attack, pneumonia, or death.   There is also a risk of short-term postoperative urinary retention with need to use a catheter. About half of patients need to go home from surgery with a catheter, which is then later removed in the office. The risk of long-term need for a catheter is very low. There is also a risk of worsening of overactive bladder.   Prolapse (with or without mesh): Risk factors for surgical failure  include things that put pressure on your pelvis and the surgical repair, including obesity, chronic cough, and heavy lifting or straining (including lifting children or adults, straining on the toilet, or lifting heavy objects such as furniture or anything weighing >25 lbs. Risks of recurrence is 20-30% with vaginal native tissue repair and a less than 10% with sacrocolpopexy with mesh.     We discussed consent for blood products. Risks for blood transfusion include allergic reactions, other reactions that can affect different body organs and managed accordingly, transmission of infectious diseases such as HIV or Hepatitis. However, the blood is screened. Patient consents for blood  products.  Pre-operative instructions:  She was instructed to not take Aspirin/NSAIDs x 7days prior to surgery. She will hold her 81mg  ASA as requested by cardiology. Antibiotic prophylaxis was ordered as indicated.  Catheter use: Patient will go home with foley if needed after post-operative voiding trial.  Post-operative instructions:  She was provided with specific post-operative instructions, including precautions and signs/symptoms for which we would recommend contacting us, in addition to daytime and after-hours contact phone numbers. This was provided on a handout.   Post-operative medications: Prescription for Gabapentin were sent to her pharmacy. Patient requested I not need scripts for Tyelnol or Miralax as she has this at home and reports she cannot take Ibuprofen. Patient states she will not take narcotics due to a past substance abuse history.   Laboratory testing:  We will check labs: As requested by anesthesia.   Preoperative clearance:  She does require surgical clearance.  Cardiac clearance letter in chart.   Post-operative follow-up:  A post-operative appointment will be made for 6 weeks from the date of surgery. If she needs a post-operative nurse visit for a voiding trial, that will be set up after she leaves the hospital.    Patient will call the clinic or use MyChart should anything change or any new issues arise.   Selmer Dominion, NP

## 2023-03-01 NOTE — H&P (Addendum)
[name] H&P  Subjective Chief Complaint: [name] presents for a preoperative encounter.   History of Present Illness: [name] is a 72 y.o. female who presents for preoperative visit.  She is scheduled to undergo Exam under anesthesia, le forte colpocleisis, posterior repair and perineorrhaphy, urethral bulking, cystoscopy  on 03/19/23.  Her symptoms include pelvic organ prolapse and stress urinary incontinence, and she was was found to have Stage III anterior, Stage I posterior, Stage II apical prolapse.   Urodynamics showed: Urodynamic Impression:  1. Sensation was reduced; capacity was normal 2. Stress Incontinence was demonstrated at normal pressures; 3. Detrusor Overactivity was demonstrated without leakage. 4. Emptying was normal with a normal PVR, a sustained detrusor contraction present,  abdominal straining not present, normal urethral sphincter activity on EMG.  Past Medical History:  Diagnosis Date   Allergy 1979   Arthritis    Bilateral carotid bruits    1-39% bilateral ICA stenosis   Blood transfusion without reported diagnosis    Branch retinal vein occlusion of left eye with macular edema 01/09/2018   Cataract 2016   Bilateral cataract surgery 2017   Chicken pox    Closed fracture of left wrist 12/13/2018   Formatting of this note might be different from the original. Added automatically from request for surgery [id number]   Diverticulosis of colon 05/16/2015   Dyslipidemia    Genital herpes    GERD (gastroesophageal reflux disease)    Hepatitis B    HSV-2 (herpes simplex virus 2) infection 06/11/2016   Hyperlipidemia 1974   On meds   Hypothyroidism    Hypothyroidism (acquired) 06/11/2016   Kidney stone    NSTEMI (non-ST elevated myocardial infarction) (HCC) 04/15/2018   Troponin 0.58 on 04/15/18   Osteoarthritis 06/11/2016   Osteopenia 11/30/2017   Pancreatitis    Peripheral vascular disease, unspecified (HCC) right subclavian  artery stenosis 04/18/2019   Prediabetes 03/11/2020   Pseudophakia of both eyes 02/18/2018   PVD (posterior vitreous detachment), bilateral 02/18/2018   S/P CABG x 4 04/15/18 04/17/2018   LIMA to LAD SVG to DIAGONAL SVG to OM SVG to RCA   Substance abuse (HCC) 1964   Clean and sober since 07/29/1989   Systolic murmur      Past Surgical History:  Procedure Laterality Date   APPENDECTOMY  1972   CORONARY ARTERY BYPASS GRAFT N/A 04/17/2018   Procedure: CORONARY ARTERY BYPASS GRAFTING (CABG) TIMES USING LEFT INTERNAL MAMMARY ARTERY TO LAD AND SAPHENOUS VEIN TO DIAG 1, OM1, AND RCA;  Surgeon: Delight Ovens, MD;  Location: [facility] OR;  Service: Open Heart Surgery;  Laterality: N/A;   ENDOVEIN HARVEST OF GREATER SAPHENOUS VEIN Right 04/17/2018   Procedure: ENDOVEIN HARVEST OF GREATER SAPHENOUS VEIN RIGHT THIGH AND CALF;  Surgeon: Delight Ovens, MD;  Location: [facility];  Service: Open Heart Surgery;  Laterality: Right;   EYE SURGERY  11/2015   Bilateral cataract surgery   FRACTURE SURGERY  R clavicle fx 1989?   Repair failed   LAPAROSCOPY     LEFT HEART CATH AND CORONARY ANGIOGRAPHY N/A 04/16/2018   Procedure: LEFT HEART CATH AND CORONARY ANGIOGRAPHY;  Surgeon: Kathleene Hazel, MD;  Location: [facility] INVASIVE [facility];  Service: Cardiovascular;  Laterality: N/A;   OVARIAN CYST SURGERY     TEE WITHOUT CARDIOVERSION N/A 04/17/2018   Procedure: TRANSESOPHAGEAL ECHOCARDIOGRAM (TEE);  Surgeon: Delight Ovens, MD;  Location: [facility] OR;  Service: Open Heart Surgery;  Laterality: N/A;   TONSILLECTOMY  WRIST SURGERY Left 11/2018   Plate placement.    is allergic to other, contrast media [iodinated contrast media], povidone-iodine, and scopolamine.   Family History  Problem Relation Age of Onset   Alcoholism Mother    Kidney disease Mother    Kidney Stones Mother    Liver cancer Mother    Cancer Mother    Vision loss Mother    Alcoholism Father    Hyperlipidemia Father    Heart disease  Father    Heart failure Father    Alcohol abuse Father    Arthritis Father    Alcoholism Maternal Grandfather    Hearing loss Brother    Heart disease Brother    Hyperlipidemia Brother    Hypertension Brother     Social History   Tobacco Use   Smoking status: Former    Current packs/day: 0.00    Average packs/day: 3.0 packs/day for 28.0 years (84.0 ttl pk-yrs)    Types: Cigarettes    Start date: 04/22/1963    Quit date: 04/22/1991    Years since quitting: 31.8   Smokeless tobacco: Never  Vaping Use   Vaping status: Never Used  Substance Use Topics   Alcohol use: Not Currently    Comment: Last drink 07/1989   Drug use: Not Currently    Types: Amphetamines, Barbituates, Benzodiazepines, "Crack" cocaine, Cocaine, Heroin, LSD, Methaqualone, Psilocybin     Review of Systems was negative for a full 10 system review except as noted in the History of Present Illness.  No current facility-administered medications for this encounter.  Current Outpatient Medications:    acetaminophen (TYLENOL) 500 MG tablet, Take 500 mg by mouth every 6 (six) hours as needed for mild pain or moderate pain., Disp: , Rfl:    acyclovir (ZOVIRAX) 400 MG tablet, Take 1 tablet (400 mg total) by mouth 2 (two) times daily., Disp: 180 tablet, Rfl: 3   aspirin EC 81 MG EC tablet, Take 1 tablet (81 mg total) by mouth daily., Disp: , Rfl:    atorvastatin (LIPITOR) 80 MG tablet, Take 1 tablet (80 mg total) by mouth daily., Disp: 90 tablet, Rfl: 3   cholecalciferol (VITAMIN D3) 25 MCG (1000 UNIT) tablet, Take 2,000 Units by mouth daily., Disp: , Rfl:    ezetimibe (ZETIA) 10 MG tablet, Take 1 tablet (10 mg total) by mouth daily., Disp: 90 tablet, Rfl: 3   gabapentin (NEURONTIN) 100 MG capsule, Take 1 capsule (100 mg total) by mouth 3 (three) times daily., Disp: 30 capsule, Rfl: 0   levothyroxine (SYNTHROID) 88 MCG tablet, Take 1 tablet (88 mcg total) by mouth daily before breakfast., Disp: 90 tablet, Rfl: 3    metoprolol tartrate (LOPRESSOR) 25 MG tablet, Take 0.5 tablets (12.5 mg total) by mouth 2 (two) times daily., Disp: 90 tablet, Rfl: 2   nitrofurantoin, macrocrystal-monohydrate, (MACROBID) 100 MG capsule, Take 1 capsule (100 mg total) by mouth 2 (two) times daily., Disp: 14 capsule, Rfl: 0   omeprazole (PRILOSEC) 20 MG capsule, Take 1 capsule (20 mg total) by mouth daily., Disp: 90 capsule, Rfl: 3   Objective There were no vitals filed for this visit.   Gen: NAD CV: S1 S2 RRR Lungs: Clear to auscultation bilaterally Abd: soft, nontender   Previous Pelvic Exam showed: Normal external female genitalia; Bartholin's and Skene's glands normal in appearance; urethral meatus normal in appearance, no urethral masses or discharge.    CST: negative   Speculum exam reveals normal vaginal mucosa with atrophy. Cervix normal appearance.  Uterus normal single, nontender. Adnexa no mass, fullness, tenderness.     Pelvic floor strength I/V, puborectalis III/V external anal sphincter III/V   Pelvic floor musculature: Right levator non-tender, Right obturator non-tender, Left levator non-tender, Left obturator non-tender   POP-Q:    POP-Q   2                                            Aa   2                                           Ba   0                                              C    4                                            Gh   3.5                                            Pb   6                                            tvl    -2                                            Ap   -2                                            Bp   -3                                              D          Rectal Exam:  Normal sphincter tone, small distal rectocele, enterocoele not present, no rectal masses, no sign of dyssynergia when asking the patient to bear down.    Assessment/ Plan  Assessment: The patient is a 72 y.o. year old scheduled to undergo Exam under anesthesia, le  forte colpocleisis, posterior repair and perineorrhaphy, urethral bulking, cystoscopy. Verbal consent was obtained for these procedures.

## 2023-03-13 ENCOUNTER — Encounter (HOSPITAL_BASED_OUTPATIENT_CLINIC_OR_DEPARTMENT_OTHER): Payer: Self-pay | Admitting: Obstetrics and Gynecology

## 2023-03-13 ENCOUNTER — Other Ambulatory Visit: Payer: Self-pay

## 2023-03-13 NOTE — Progress Notes (Signed)
Spoke w/ via phone for pre-op interview---pt Lab needs dos---EKG, I stat-         Lab results------ COVID test -----patient states asymptomatic no test needed Arrive at -------800 03-29-2023 NPO after MN NO Solid Food.  Clear liquids from MN until---700 am Med rec completed Medications to take morning of surgery -----metorprolol tatrate, levothyroxien, prilosec, acvclovir Diabetic medication -----n/a Patient instructed no nail polish to be worn day of surgery Patient instructed to bring photo id and insurance card day of surgery Patient aware to have Driver (ride ) / caregiver    for 24 hours after surgery -  frined donna kisana Patient Special Instructions -----none Pre-Op special Instructions -----none Patient verbalized understanding of instructions that were given at this phone interview. Patient denies chest pain, sob, fever, cough at the interview.   Cardiac clearance emily monge  hold  5 to 7 days, last dose 10- 01-11-2023 chart/epic, last dose  03-11-2023 per pt Echo 5-22--2024 epic US carotid bilateral 12-15-2022 epic Heart cath 04-16-2018 epic

## 2023-03-19 ENCOUNTER — Encounter (HOSPITAL_BASED_OUTPATIENT_CLINIC_OR_DEPARTMENT_OTHER)
Admission: RE | Disposition: A | Discharge status: Home / Self Care | Payer: Self-pay | Source: Home / Self Care | Attending: Obstetrics and Gynecology

## 2023-03-19 ENCOUNTER — Ambulatory Visit (HOSPITAL_BASED_OUTPATIENT_CLINIC_OR_DEPARTMENT_OTHER): Payer: Medicare Other | Admitting: Anesthesiology

## 2023-03-19 ENCOUNTER — Encounter (HOSPITAL_BASED_OUTPATIENT_CLINIC_OR_DEPARTMENT_OTHER): Payer: Self-pay | Admitting: Obstetrics and Gynecology

## 2023-03-19 ENCOUNTER — Ambulatory Visit (HOSPITAL_BASED_OUTPATIENT_CLINIC_OR_DEPARTMENT_OTHER)
Admission: RE | Admit: 2023-03-19 | Discharge: 2023-03-19 | Disposition: A | Discharge status: Home / Self Care | Payer: Medicare Other | Attending: Obstetrics and Gynecology | Admitting: Obstetrics and Gynecology

## 2023-03-19 ENCOUNTER — Other Ambulatory Visit: Payer: Self-pay

## 2023-03-19 DIAGNOSIS — N812 Incomplete uterovaginal prolapse: Secondary | ICD-10-CM | POA: Diagnosis not present

## 2023-03-19 DIAGNOSIS — I739 Peripheral vascular disease, unspecified: Secondary | ICD-10-CM | POA: Diagnosis not present

## 2023-03-19 DIAGNOSIS — Z87891 Personal history of nicotine dependence: Secondary | ICD-10-CM | POA: Diagnosis not present

## 2023-03-19 DIAGNOSIS — N816 Rectocele: Secondary | ICD-10-CM | POA: Diagnosis not present

## 2023-03-19 DIAGNOSIS — N814 Uterovaginal prolapse, unspecified: Secondary | ICD-10-CM | POA: Diagnosis not present

## 2023-03-19 DIAGNOSIS — Z951 Presence of aortocoronary bypass graft: Secondary | ICD-10-CM | POA: Diagnosis not present

## 2023-03-19 DIAGNOSIS — I252 Old myocardial infarction: Secondary | ICD-10-CM | POA: Insufficient documentation

## 2023-03-19 DIAGNOSIS — I2510 Atherosclerotic heart disease of native coronary artery without angina pectoris: Secondary | ICD-10-CM | POA: Diagnosis not present

## 2023-03-19 DIAGNOSIS — E039 Hypothyroidism, unspecified: Secondary | ICD-10-CM | POA: Diagnosis not present

## 2023-03-19 DIAGNOSIS — N393 Stress incontinence (female) (male): Secondary | ICD-10-CM | POA: Diagnosis not present

## 2023-03-19 DIAGNOSIS — K219 Gastro-esophageal reflux disease without esophagitis: Secondary | ICD-10-CM | POA: Insufficient documentation

## 2023-03-19 DIAGNOSIS — N842 Polyp of vagina: Secondary | ICD-10-CM | POA: Diagnosis not present

## 2023-03-19 DIAGNOSIS — Z01818 Encounter for other preprocedural examination: Secondary | ICD-10-CM

## 2023-03-19 HISTORY — PX: RECTOCELE REPAIR: SHX761

## 2023-03-19 HISTORY — PX: CYSTOSCOPY: SHX5120

## 2023-03-19 HISTORY — PX: COLPOCLEISIS: SHX6814

## 2023-03-19 HISTORY — DX: Displaced fracture of lunate (semilunar), right wrist, initial encounter for closed fracture: S62.121A

## 2023-03-19 HISTORY — DX: Other specified postprocedural states: Z98.890

## 2023-03-19 HISTORY — DX: Nausea with vomiting, unspecified: R11.2

## 2023-03-19 LAB — POCT I-STAT, CHEM 8
BUN: 15 mg/dL (ref 8–23)
Calcium, Ion: 1.2 mmol/L (ref 1.15–1.40)
Chloride: 101 mmol/L (ref 98–111)
Creatinine, Ser: 0.7 mg/dL (ref 0.44–1.00)
Glucose, Bld: 106 mg/dL — ABNORMAL HIGH (ref 70–99)
HCT: 41 % (ref 36.0–46.0)
Hemoglobin: 13.9 g/dL (ref 12.0–15.0)
Potassium: 4.3 mmol/L (ref 3.5–5.1)
Sodium: 136 mmol/L (ref 135–145)
TCO2: 24 mmol/L (ref 22–32)

## 2023-03-19 SURGERY — COLPOCLEISIS
Anesthesia: General | Site: Vagina

## 2023-03-19 MED ORDER — ONDANSETRON HCL 4 MG/2ML IJ SOLN
INTRAMUSCULAR | Status: DC | PRN
  Administered 2023-03-19: 4 mg via INTRAVENOUS

## 2023-03-19 MED ORDER — SODIUM CHLORIDE 0.9 % IR SOLN
Status: DC | PRN
  Administered 2023-03-19: 500 mL

## 2023-03-19 MED ORDER — CEFAZOLIN SODIUM-DEXTROSE 2-4 GM/100ML-% IV SOLN
2.0000 g | INTRAVENOUS | Status: AC
  Administered 2023-03-19: 2 g via INTRAVENOUS

## 2023-03-19 MED ORDER — KETOROLAC TROMETHAMINE 30 MG/ML IJ SOLN
INTRAMUSCULAR | Status: DC | PRN
  Administered 2023-03-19: 30 mg via INTRAVENOUS

## 2023-03-19 MED ORDER — SODIUM CHLORIDE 0.9 % IV SOLN: INTRAVENOUS | Status: DC | PRN

## 2023-03-19 MED ORDER — GLYCOPYRROLATE 0.2 MG/ML IJ SOLN
INTRAMUSCULAR | Status: DC | PRN
  Administered 2023-03-19: .2 mg via INTRAVENOUS

## 2023-03-19 MED ORDER — ENOXAPARIN SODIUM 40 MG/0.4ML IJ SOSY
PREFILLED_SYRINGE | INTRAMUSCULAR | Status: AC
  Filled 2023-03-19: qty 0.4

## 2023-03-19 MED ORDER — LIDOCAINE HCL (CARDIAC) PF 100 MG/5ML IV SOSY
PREFILLED_SYRINGE | INTRAVENOUS | Status: DC | PRN
  Administered 2023-03-19: 100 mg via INTRAVENOUS

## 2023-03-19 MED ORDER — OXYCODONE HCL 5 MG/5ML PO SOLN: 5.0000 mg | Freq: Once | ORAL | Status: DC | PRN

## 2023-03-19 MED ORDER — ROCURONIUM BROMIDE 10 MG/ML (PF) SYRINGE
PREFILLED_SYRINGE | INTRAVENOUS | Status: AC
  Filled 2023-03-19: qty 10

## 2023-03-19 MED ORDER — ROCURONIUM BROMIDE 100 MG/10ML IV SOLN
INTRAVENOUS | Status: DC | PRN
  Administered 2023-03-19: 10 mg via INTRAVENOUS
  Administered 2023-03-19: 50 mg via INTRAVENOUS

## 2023-03-19 MED ORDER — PHENAZOPYRIDINE HCL 100 MG PO TABS
200.0000 mg | ORAL_TABLET | ORAL | Status: AC
  Administered 2023-03-19: 200 mg via ORAL

## 2023-03-19 MED ORDER — PHENYLEPHRINE 80 MCG/ML (10ML) SYRINGE FOR IV PUSH (FOR BLOOD PRESSURE SUPPORT)
PREFILLED_SYRINGE | INTRAVENOUS | Status: AC
  Filled 2023-03-19: qty 10

## 2023-03-19 MED ORDER — FENTANYL CITRATE (PF) 100 MCG/2ML IJ SOLN
INTRAMUSCULAR | Status: DC | PRN
  Administered 2023-03-19 (×2): 50 ug via INTRAVENOUS

## 2023-03-19 MED ORDER — EPHEDRINE SULFATE (PRESSORS) 50 MG/ML IJ SOLN
INTRAMUSCULAR | Status: DC | PRN
  Administered 2023-03-19: 10 mg via INTRAVENOUS
  Administered 2023-03-19: 5 mg via INTRAVENOUS
  Administered 2023-03-19: 10 mg via INTRAVENOUS

## 2023-03-19 MED ORDER — PROPOFOL 10 MG/ML IV BOLUS
INTRAVENOUS | Status: DC | PRN
  Administered 2023-03-19: 100 mg via INTRAVENOUS

## 2023-03-19 MED ORDER — ALBUMIN HUMAN 5 % IV SOLN: INTRAVENOUS | Status: DC | PRN

## 2023-03-19 MED ORDER — OXYCODONE HCL 5 MG PO TABS: 5.0000 mg | ORAL_TABLET | Freq: Once | ORAL | Status: DC | PRN

## 2023-03-19 MED ORDER — STERILE WATER FOR IRRIGATION IR SOLN
Status: DC | PRN
  Administered 2023-03-19: 500 mL

## 2023-03-19 MED ORDER — HYDROMORPHONE HCL 1 MG/ML IJ SOLN: 0.2500 mg | INTRAMUSCULAR | Status: DC | PRN

## 2023-03-19 MED ORDER — DEXAMETHASONE SODIUM PHOSPHATE 4 MG/ML IJ SOLN
INTRAMUSCULAR | Status: DC | PRN
  Administered 2023-03-19: 5 mg via INTRAVENOUS

## 2023-03-19 MED ORDER — CEFAZOLIN SODIUM-DEXTROSE 2-4 GM/100ML-% IV SOLN
INTRAVENOUS | Status: AC
  Filled 2023-03-19: qty 100

## 2023-03-19 MED ORDER — PHENYLEPHRINE HCL-NACL 20-0.9 MG/250ML-% IV SOLN
INTRAVENOUS | Status: DC | PRN
  Administered 2023-03-19: 50 ug/min via INTRAVENOUS

## 2023-03-19 MED ORDER — DROPERIDOL 2.5 MG/ML IJ SOLN
INTRAMUSCULAR | Status: DC | PRN
  Administered 2023-03-19: .625 mg via INTRAVENOUS

## 2023-03-19 MED ORDER — ONDANSETRON HCL 4 MG/2ML IJ SOLN
INTRAMUSCULAR | Status: AC
  Filled 2023-03-19: qty 2

## 2023-03-19 MED ORDER — EPHEDRINE 5 MG/ML INJ
INTRAVENOUS | Status: AC
  Filled 2023-03-19: qty 5

## 2023-03-19 MED ORDER — 0.9 % SODIUM CHLORIDE (POUR BTL) OPTIME
TOPICAL | Status: DC | PRN
  Administered 2023-03-19: 500 mL

## 2023-03-19 MED ORDER — PROPOFOL 10 MG/ML IV BOLUS
INTRAVENOUS | Status: AC
  Filled 2023-03-19: qty 20

## 2023-03-19 MED ORDER — ENOXAPARIN SODIUM 40 MG/0.4ML IJ SOSY
40.0000 mg | PREFILLED_SYRINGE | INTRAMUSCULAR | Status: AC
  Administered 2023-03-19: 40 mg via SUBCUTANEOUS

## 2023-03-19 MED ORDER — ALBUMIN HUMAN 5 % IV SOLN
INTRAVENOUS | Status: AC
  Filled 2023-03-19: qty 250

## 2023-03-19 MED ORDER — FENTANYL CITRATE (PF) 100 MCG/2ML IJ SOLN
INTRAMUSCULAR | Status: AC
  Filled 2023-03-19: qty 2

## 2023-03-19 MED ORDER — GABAPENTIN 300 MG PO CAPS: 300.0000 mg | ORAL_CAPSULE | ORAL | Status: DC

## 2023-03-19 MED ORDER — ACETAMINOPHEN 500 MG PO TABS
1000.0000 mg | ORAL_TABLET | Freq: Once | ORAL | Status: AC
  Administered 2023-03-19: 1000 mg via ORAL

## 2023-03-19 MED ORDER — DEXAMETHASONE SODIUM PHOSPHATE 10 MG/ML IJ SOLN
INTRAMUSCULAR | Status: AC
  Filled 2023-03-19: qty 1

## 2023-03-19 MED ORDER — ACETAMINOPHEN 500 MG PO TABS: 1000.0000 mg | ORAL_TABLET | ORAL | Status: DC

## 2023-03-19 MED ORDER — LIDOCAINE HCL (PF) 2 % IJ SOLN
INTRAMUSCULAR | Status: AC
  Filled 2023-03-19: qty 5

## 2023-03-19 MED ORDER — PHENYLEPHRINE HCL (PRESSORS) 10 MG/ML IV SOLN
INTRAVENOUS | Status: AC
  Filled 2023-03-19: qty 1

## 2023-03-19 MED ORDER — LIDOCAINE-EPINEPHRINE 1 %-1:100000 IJ SOLN
INTRAMUSCULAR | Status: DC | PRN
  Administered 2023-03-19: 20 mL

## 2023-03-19 MED ORDER — LACTATED RINGERS IV SOLN: INTRAVENOUS | Status: DC

## 2023-03-19 MED ORDER — PHENAZOPYRIDINE HCL 100 MG PO TABS
ORAL_TABLET | ORAL | Status: AC
  Filled 2023-03-19: qty 2

## 2023-03-19 MED ORDER — SUGAMMADEX SODIUM 200 MG/2ML IV SOLN
INTRAVENOUS | Status: DC | PRN
  Administered 2023-03-19: 200 mg via INTRAVENOUS

## 2023-03-19 MED ORDER — ACETAMINOPHEN 500 MG PO TABS
ORAL_TABLET | ORAL | Status: AC
  Filled 2023-03-19: qty 2

## 2023-03-19 MED ORDER — ONDANSETRON HCL 4 MG/2ML IJ SOLN: 4.0000 mg | Freq: Once | INTRAMUSCULAR | Status: DC | PRN

## 2023-03-19 MED ORDER — PHENYLEPHRINE HCL (PRESSORS) 10 MG/ML IV SOLN
INTRAVENOUS | Status: DC | PRN
  Administered 2023-03-19: 160 ug via INTRAVENOUS
  Administered 2023-03-19: 80 ug via INTRAVENOUS
  Administered 2023-03-19: 160 ug via INTRAVENOUS
  Administered 2023-03-19: 80 ug via INTRAVENOUS
  Administered 2023-03-19: 160 ug via INTRAVENOUS
  Administered 2023-03-19 (×2): 80 ug via INTRAVENOUS

## 2023-03-19 SURGICAL SUPPLY — 39 items
BAG DRN RND TRDRP ANRFLXCHMBR (UROLOGICAL SUPPLIES) ×3
BAG URINE DRAIN 2000ML AR STRL (UROLOGICAL SUPPLIES) IMPLANT
BLADE SURG 15 STRL LF DISP TIS (BLADE) ×3 IMPLANT
BLADE SURG 15 STRL SS (BLADE) ×3
CATH FOLEY 2WAY SLVR 5CC 12FR (CATHETERS) IMPLANT
GLOVE BIOGEL PI IND STRL 6.5 (GLOVE) ×3 IMPLANT
GLOVE BIOGEL PI IND STRL 7.0 (GLOVE) ×3 IMPLANT
GLOVE ECLIPSE 6.0 STRL STRAW (GLOVE) ×3 IMPLANT
GLOVE ECLIPSE 6.5 STRL STRAW (GLOVE) IMPLANT
GLOVE SURG SS PI 6.5 STRL IVOR (GLOVE) IMPLANT
GLOVE SURG SS PI 7.0 STRL IVOR (GLOVE) IMPLANT
GOWN STRL REUS W/ TWL LRG LVL3 (GOWN DISPOSABLE) IMPLANT
GOWN STRL REUS W/TWL LRG LVL3 (GOWN DISPOSABLE) ×9 IMPLANT
HOLDER FOLEY CATH W/STRAP (MISCELLANEOUS) ×3 IMPLANT
IV NS 500ML (IV SOLUTION) ×3
IV NS 500ML BAXH (IV SOLUTION) IMPLANT
KIT TURNOVER CYSTO (KITS) ×3 IMPLANT
MANIFOLD NEPTUNE II (INSTRUMENTS) ×3 IMPLANT
NDL HYPO 22X1.5 SAFETY MO (MISCELLANEOUS) ×3 IMPLANT
NEEDLE HYPO 22X1.5 SAFETY MO (MISCELLANEOUS) ×3
NS IRRIG 500ML POUR BTL (IV SOLUTION) IMPLANT
PACK CYSTO (CUSTOM PROCEDURE TRAY) ×3 IMPLANT
PACK VAGINAL WOMENS (CUSTOM PROCEDURE TRAY) ×3 IMPLANT
PAD OB MATERNITY 4.3X12.25 (PERSONAL CARE ITEMS) ×3 IMPLANT
RETRACTOR LONE STAR DISPOSABLE (INSTRUMENTS) ×3 IMPLANT
RETRACTOR STAY HOOK 5MM (MISCELLANEOUS) ×3 IMPLANT
SCRUB CHG 4% DYNA-HEX 4OZ (MISCELLANEOUS) ×3 IMPLANT
SET IRRIG Y TYPE TUR BLADDER L (SET/KITS/TRAYS/PACK) ×3 IMPLANT
SLEEVE SCD COMPRESS KNEE MED (STOCKING) ×3 IMPLANT
SPIKE FLUID TRANSFER (MISCELLANEOUS) IMPLANT
SUT VIC AB 0 CT1 27 (SUTURE) ×3
SUT VIC AB 0 CT1 27XCR 8 STRN (SUTURE) ×3 IMPLANT
SUT VICRYL 2-0 SH 8X27 (SUTURE) ×3 IMPLANT
SYR BULB EAR ULCER 3OZ GRN STR (SYRINGE) ×3 IMPLANT
SYSTEM URETHRAL BULK BULKAMID (Female Continence) IMPLANT
TOWEL OR 17X24 6PK STRL BLUE (TOWEL DISPOSABLE) ×3 IMPLANT
TRAY FOLEY W/BAG SLVR 14FR LF (SET/KITS/TRAYS/PACK) ×3 IMPLANT
TUBE CONNECTING 12X1/4 (SUCTIONS) IMPLANT
WATER STERILE IRR 500ML POUR (IV SOLUTION) IMPLANT

## 2023-03-19 NOTE — Anesthesia Procedure Notes (Signed)
Procedure Name: Intubation Date/Time: 03/19/2023 10:09 AM  Performed by: Jessica Priest, CRNAPre-anesthesia Checklist: Patient identified, Emergency Drugs available, Suction available, Patient being monitored and Timeout performed Patient Re-evaluated:Patient Re-evaluated prior to induction Oxygen Delivery Method: Circle system utilized Preoxygenation: Pre-oxygenation with 100% oxygen Induction Type: IV induction Ventilation: Mask ventilation without difficulty Laryngoscope Size: Mac and 3 Grade View: Grade II Tube type: Oral Tube size: 7.0 mm Number of attempts: 1 Airway Equipment and Method: Stylet and Oral airway Placement Confirmation: ETT inserted through vocal cords under direct vision, positive ETCO2, breath sounds checked- equal and bilateral and CO2 detector Secured at: 22 cm Tube secured with: Tape Dental Injury: Teeth and Oropharynx as per pre-operative assessment

## 2023-03-19 NOTE — Op Note (Signed)
Operative Note  Preoperative Diagnosis: anterior vaginal prolapse, posterior vaginal prolapse, uterovaginal prolapse, incomplete, and stress urinary incontinence  Postoperative Diagnosis: same  Procedures performed:  LeForte colpocleisis, levator plication, perineorrhaphy, cystoscopy, urethral bulking (Bulkamid)  Implants:  Implant Name Type Inv. Item Serial No. Manufacturer Lot No. LRB No. Used Action  SYSTEM Ginny Forth [id number] Female Continence SYSTEM URETHRAL [employer] [id number] N/A 1 Implanted    Attending Surgeon: Lanetta Inch, MD  Anesthesia: General endotracheal  Findings: Small polyp noted at vaginal apex, very small cervix. On cystoscopy, normal bladder and urethra without injury. Brisk bilateral ureteral efflux noted.    Specimens:  ID Type Source Tests Collected by Time Destination  1 : vaginal polyp Tissue PATH Gyn biopsy SURGICAL PATHOLOGY Marguerita Beards, MD 03/19/2023 1028     Estimated blood loss: 50 mL  IV fluids: 1000 mL  Urine output: 500 mL  Complications: none  Procedure in Detail:  After informed consent was obtained, the patient was taken to the operating room where she was placed under anesthesia.  She was then placed in the dorsal lithotomy position, taking care to avoid traction on the extremities. She was then prepped and draped in the usual sterile fashion.  A self-retaining lonestar retractor was placed using four elastic blue stays.  After a foley catheter was inserted into the urethra, the location of the midurethra was palpated. Two Allis clamps were placed at the vaginal apex. The cervix appeared small and pinpoint with a small polyp. The polyp was removed.  A marking pen was used to delineate anterior and posterior rectangles of vaginal mucosa to remove with remaining lateral tunnels. 1% lidocaine with epinephrine was injected into the mucosa. An incision was made with a 15 blade scalpel along these  marked lines. After an Allis clamp was placed on the vaginal mucosa, the underlying vaginal muscularis was dissected off the mucosa.  The vaginal mucosa was excised off each anterior and posterior section. Excellent hemostasis was obtained with cautery. The mucosa on the lateral sides was closed into a tunnel with a 2-0 vicryl suture.  Serial pursestring sutures were used to imbricate and the cervix and vaginal prolapse using 2-0 vicryl. Anterior and posterior plication sutures were placed to reapproximate the epithelium with 0-vicryl. The mucosa was then closed with a 0-vicryl suture in a running fashion.   The Foley catheter was removed.  A 70-degree cystoscope was introduced, and 360-degree inspection revealed no trauma or lesions the bladder, with bilateral ureteral efflux.  The bladder was drained and the cystoscope was removed.  The Foley catheter was reinserted.   Attention was then turned to the perineum. Two allis clamps were placed at the introitus. The perineum was injected with 1% lidocaine with epinephrine. A diamond shaped incision was made over the perineum and excess skin was removed. Dissection was performed with Metzenbaum scissors to separate the mucosa from the underlying tissue. The levator muscles were plicated to the midline with two interrupted 0-vicryl sutures. The perineal body was then reapproximated with two interrupted 0-vicryl sutures. The perineal skin was then closed with a 2-0 vicryl in a subcutaneous and running fashion. Irrigation was performed and good hemostasis was noted.   A 0 degree Bulkamid cystoscope was inserted into the urethra into the bladder.  The needle was primed.  The cystoscope was inserted to the level of the bladder neck.  The needle was inserted 2 cm and the scope was pulled back into the urethra 2 cm.  The needle was inserted bevel up at the 5 o'clock position and the Bulkamid was injected to obtain coaptation.  This was repeated at the 2 o'clock,  10  o'clock and 7 o'clock positions.   A total of two 1ml syringes were used. and good circumferential coaptation was noted. The cystoscope was removed and a 11F foley was placed. The patient tolerated the procedure well.  She was awakened from anesthesia and transferred to the recovery room in stable condition. All counts were correct x 2.    Marguerita Beards, MD

## 2023-03-19 NOTE — Transfer of Care (Signed)
Immediate Anesthesia Transfer of Care Note  Patient: [name]  Procedure(s) Performed: Procedure(s) (LRB): Le Forte COLPOCLEISIS (N/A) LEVATOR PLICATION and PERINEORRHAPHY (N/A) URETHRAL BULKING (N/A) CYSTOSCOPY (N/A) EXAM UNDER ANESTHESIA  Patient Location: PACU  Anesthesia Type: GA  Level of Consciousness: awake, sedated, patient cooperative and responds to stimulation  Airway & Oxygen Therapy: Patient Spontanous Breathing and Patient connected to [place] oxygen  Post-op Assessment: Report given to PACU RN, Post -op Vital signs reviewed and stable and Patient moving all extremities  Post vital signs: Reviewed and stable  Complications: No apparent anesthesia complications

## 2023-03-19 NOTE — Anesthesia Preprocedure Evaluation (Addendum)
Anesthesia Evaluation  Patient identified by MRN, date of birth, ID band Patient awake    Reviewed: Allergy & Precautions, H&P , NPO status , Patient's Chart, lab work & pertinent test results  History of Anesthesia Complications (+) PONV and history of anesthetic complications  Airway Mallampati: II  TM Distance: >3 FB Neck ROM: Full    Dental  (+) Edentulous Upper, Edentulous Lower   Pulmonary former smoker   Pulmonary exam normal breath sounds clear to auscultation       Cardiovascular + CAD, + Past MI, + CABG and + Peripheral Vascular Disease  + Valvular Problems/Murmurs MR  Rhythm:Regular Rate:Normal + Systolic murmurs    Neuro/Psych negative neurological ROS  negative psych ROS   GI/Hepatic Neg liver ROS,GERD  Medicated,,  Endo/Other  Hypothyroidism    Renal/GU negative Renal ROS  negative genitourinary   Musculoskeletal negative musculoskeletal ROS (+)    Abdominal   Peds negative pediatric ROS (+)  Hematology negative hematology ROS (+)   Anesthesia Other Findings   Reproductive/Obstetrics negative OB ROS                             Anesthesia Physical Anesthesia Plan  ASA: 3  Anesthesia Plan: General   Post-op Pain Management: Tylenol PO (pre-op)*   Induction: Intravenous  PONV Risk Score and Plan: 3 and Ondansetron, Dexamethasone, Treatment may vary due to age or medical condition and Droperidol  Airway Management Planned: Oral ETT  Additional Equipment:   Intra-op Plan:   Post-operative Plan: Extubation in OR  Informed Consent: I have reviewed the patients History and Physical, chart, labs and discussed the procedure including the risks, benefits and alternatives for the proposed anesthesia with the patient or authorized representative who has indicated his/her understanding and acceptance.     Dental advisory given  Plan Discussed with: CRNA and  Surgeon  Anesthesia Plan Comments:        Anesthesia Quick Evaluation

## 2023-03-19 NOTE — Interval H&P Note (Signed)
History and Physical Interval Note:  03/19/2023 9:29 AM  [name]  has presented today for surgery, with the diagnosis of anterior vaginal prolapse; uterovaginal prolapse incomplete, posterior vaginal prolapse, stress urinary incontinence.  The various methods of treatment have been discussed with the patient and family. After consideration of risks, benefits and other options for treatment, the patient has consented to  Procedure(s): Le Forte COLPOCLEISIS (N/A) POSTERIOR REPAIR (RECTOCELE) and PERINEORRHAPHY (N/A) URETHRAL BULKING (N/A) CYSTOSCOPY (N/A) EXAM UNDER ANESTHESIA as a surgical intervention.  The patient's history has been reviewed, patient examined, no change in status, stable for surgery.  I have reviewed the patient's chart and labs.  Questions were answered to the patient's satisfaction.     Marguerita Beards

## 2023-03-19 NOTE — Anesthesia Postprocedure Evaluation (Signed)
Anesthesia Post Note  Patient: Prerana Strayer  Procedure(s) Performed: Ignatius Specking COLPOCLEISIS (Vagina ) LEVATOR PLICATION and PERINEORRHAPHY (Vagina ) URETHRAL BULKING (Urethra) CYSTOSCOPY (Urethra) EXAM UNDER ANESTHESIA (Vagina )     Patient location during evaluation: PACU Anesthesia Type: General Level of consciousness: awake and alert Pain management: pain level controlled Vital Signs Assessment: post-procedure vital signs reviewed and stable Respiratory status: spontaneous breathing, nonlabored ventilation, respiratory function stable and patient connected to nasal cannula oxygen Cardiovascular status: blood pressure returned to baseline and stable Postop Assessment: no apparent nausea or vomiting Anesthetic complications: no  No notable events documented.  Last Vitals:  Vitals:   03/19/23 1215 03/19/23 1230  BP: 92/75 (!) 79/62  Pulse: 93 88  Resp: 19 12  Temp:    SpO2: 91% 94%    Last Pain:  Vitals:   03/19/23 1230  TempSrc:   PainSc: 0-No pain                 Yasmin Dibello S

## 2023-03-19 NOTE — Discharge Instructions (Addendum)

## 2023-03-19 NOTE — Progress Notes (Signed)
Pt was unable to void.  Dr. Florian Buff at bedside and gave VO for Foley catheter.  Pt aware.  Catheter placed. Patient tolerated well.  Catheter instructions completed.

## 2023-03-20 ENCOUNTER — Telehealth: Payer: Self-pay | Admitting: Obstetrics and Gynecology

## 2023-03-20 LAB — SURGICAL PATHOLOGY

## 2023-03-20 NOTE — Telephone Encounter (Signed)
[name] underwent Colpocleisis, urethral bulking and cystoscopy on 03/20/23.   She failed her voiding trial.  was backfilled into the bladder Voided  PVR by bladder scan was .   She was discharged with a catheter (55F). Please call her for a routine post op check and to schedule a voiding trial by Thursday 11/7. Thanks!  Marguerita Beards, MD

## 2023-03-21 ENCOUNTER — Encounter (HOSPITAL_BASED_OUTPATIENT_CLINIC_OR_DEPARTMENT_OTHER): Payer: Self-pay | Admitting: Obstetrics and Gynecology

## 2023-03-22 ENCOUNTER — Ambulatory Visit: Payer: Medicare Other

## 2023-03-22 DIAGNOSIS — Z48816 Encounter for surgical aftercare following surgery on the genitourinary system: Secondary | ICD-10-CM

## 2023-03-22 NOTE — Patient Instructions (Signed)
Please drink lots of water and try to expel as much urine as you are inputting. If you are unable to urinate, give the office a call before 2 pm. Please keep all future appointments and if you have any questions or concerns please feel free to contact our office at [phone].

## 2023-03-22 NOTE — Progress Notes (Signed)
Urogyn Nurse Voiding Trial Note  [name] underwent [name] COLPOCLEISIS (Vagina )  LEVATOR PLICATION and PERINEORRHAPHY (Vagina )  URETHRAL BULKING (Urethra)  CYSTOSCOPY (Urethra)  EXAM UNDER ANESTHESIA  on 03/19/2023.  She presents today for a voiding trial .   Patient was identified with 2 indicators. 290 ml of NS was instilled into the bladder via the catheter. The catheter was removed and patient was instructed to void into the urinary hat. She voided 310 ml. The post void residual measured by bladder scan was 3 ml. She passed the voiding trial and a catheter was not replaced.   The patient received aftercare instructions and will follow up as scheduled.

## 2023-03-29 ENCOUNTER — Encounter: Payer: Self-pay | Admitting: Obstetrics and Gynecology

## 2023-03-29 ENCOUNTER — Other Ambulatory Visit (HOSPITAL_COMMUNITY)
Admission: RE | Admit: 2023-03-29 | Discharge: 2023-03-29 | Disposition: A | Discharge status: Home / Self Care | Payer: Medicare Other | Source: Ambulatory Visit | Attending: Obstetrics and Gynecology | Admitting: Obstetrics and Gynecology

## 2023-03-29 ENCOUNTER — Ambulatory Visit: Payer: Medicare Other | Admitting: Obstetrics and Gynecology

## 2023-03-29 VITALS — BP 106/67 | HR 63

## 2023-03-29 DIAGNOSIS — N898 Other specified noninflammatory disorders of vagina: Secondary | ICD-10-CM | POA: Insufficient documentation

## 2023-03-29 DIAGNOSIS — Z48816 Encounter for surgical aftercare following surgery on the genitourinary system: Secondary | ICD-10-CM

## 2023-03-29 DIAGNOSIS — L02215 Cutaneous abscess of perineum: Secondary | ICD-10-CM

## 2023-03-29 DIAGNOSIS — Z09 Encounter for follow-up examination after completed treatment for conditions other than malignant neoplasm: Secondary | ICD-10-CM

## 2023-03-29 MED ORDER — CIPROFLOXACIN HCL 500 MG PO TABS: 500.0000 mg | ORAL_TABLET | Freq: Two times a day (BID) | ORAL | 0 refills | Status: AC

## 2023-03-29 MED ORDER — CIPROFLOXACIN HCL 500 MG PO TABS: 500.0000 mg | ORAL_TABLET | Freq: Two times a day (BID) | ORAL | 0 refills | Status: DC

## 2023-03-29 NOTE — Progress Notes (Signed)
[facility] Return Visit  SUBJECTIVE  History of Present Illness: Charlinda Blonder is a 72 y.o. female seen in follow-up for possible surgical site infection/irritation from surgery on 11/4  Surgery: Exam under anesthesia, le forte colpocleisis, posterior repair and perineorrhaphy, urethral bulking, cystoscopy   Patient reports pain and irritation on the perineum that has worsened and that she has noticed redness.   Past Medical History: Patient  has a past medical history of Allergy (1979), Arthritis, Bilateral carotid bruits, Blood transfusion without reported diagnosis, Branch retinal vein occlusion of left eye with macular edema (01/09/2018), Cataract (2016), Chicken pox, Closed fracture dislocation of lunate bone of right wrist, Closed fracture of left wrist (12/13/2018), Diverticulosis of colon (05/16/2015), Dyslipidemia, Genital herpes, GERD (gastroesophageal reflux disease), Hepatitis B, HSV-2 (herpes simplex virus 2) infection (06/11/2016), Hyperlipidemia (1974), Hypothyroidism (acquired) (06/11/2016), Kidney stone, NSTEMI (non-ST elevated myocardial infarction) (HCC) (04/15/2018), Osteoarthritis (06/11/2016), Osteopenia (11/30/2017), Pancreatitis, Peripheral vascular disease, unspecified (HCC) right subclavian artery stenosis (04/18/2019), PONV (postoperative nausea and vomiting), Prediabetes (03/11/2020), Pseudophakia of both eyes (02/18/2018), PVD (posterior vitreous detachment), bilateral (02/18/2018), S/P CABG x 4 04/15/18 (04/17/2018), Substance abuse (HCC) (1964), and Systolic murmur.   Past Surgical History: She  has a past surgical history that includes Appendectomy (1972); Tonsillectomy; laparoscopy; Ovarian cyst surgery; LEFT HEART CATH AND CORONARY ANGIOGRAPHY (N/A, 04/16/2018); Coronary artery bypass graft (N/A, 04/17/2018); TEE without cardioversion (N/A, 04/17/2018); Endoharvest vein of greater saphenous vein (Right, 04/17/2018); Eye surgery (11/2015); Fracture  surgery (R clavicle fx 1989?); Wrist surgery (Left, 11/2018); clavicle repair 1970's right; Colpocleisis (N/A, 03/19/2023); Rectocele repair (N/A, 03/19/2023); and Cystoscopy (N/A, 03/19/2023).   Medications: She has a current medication list which includes the following prescription(s): acetaminophen, acyclovir, vitamin c with rose hips, aspirin ec, atorvastatin, cholecalciferol, ciprofloxacin, ezetimibe, levothyroxine, metoprolol tartrate, multivitamin, omeprazole, gabapentin, and nitrofurantoin (macrocrystal-monohydrate).   Allergies: Patient is allergic to other, contrast media [iodinated contrast media], povidone-iodine, and scopolamine.   Social History: Patient  reports that she quit smoking about 31 years ago. Her smoking use included cigarettes. She started smoking about 59 years ago. She has a 84 pack-year smoking history. She has never used smokeless tobacco. She reports that she does not currently use alcohol. She reports that she does not currently use drugs after having used the following drugs: Amphetamines, Barbituates, Benzodiazepines, "Crack" cocaine, Cocaine, Heroin, LSD, Methaqualone, and Psilocybin.      OBJECTIVE     Physical Exam: Vitals:   03/29/23 1347  BP: 106/67  Pulse: 63   Gen: No apparent distress, A&O x 3.  Detailed Urogynecologic Evaluation:  Externally patient has an area concerning for abscess on the left perineal region. The area is red and there is a small amount of puss noted. No signs of obvious cellulitis. The area is slightly warm and tender to touch. Patient also has some significant white discharge from the vagina that is not purulent drainage but is suspicious for yeast.    ASSESSMENT AND PLAN    Ms. Gatta is a 72 y.o. with:  1. Postop check   2. Perineal abscess     The area of concern is consistent with a small abscess measuring 1cm x2.5cm externally. Will treat with oral Cipro 500mg  x5 days and a culture was taken of the drainage expressed  during exam to make sure it is sensitive to antibiotics.   Also performed an aptima swab today to rule out yeast or BV related to sutures.   Will plan for patient to follow up in 1 week  with Dr. Florian Buff or sooner if needed.

## 2023-03-30 NOTE — Telephone Encounter (Signed)
Pt scheduled for 11/19

## 2023-04-02 LAB — CERVICOVAGINAL ANCILLARY ONLY
Bacterial Vaginitis (gardnerella): NEGATIVE
Candida Glabrata: NEGATIVE
Candida Vaginitis: NEGATIVE
Comment: NEGATIVE
Comment: NEGATIVE
Comment: NEGATIVE

## 2023-04-02 LAB — AEROBIC/ANAEROBIC CULTURE W GRAM STAIN (SURGICAL/DEEP WOUND)

## 2023-04-02 MED ORDER — FLUCONAZOLE 150 MG PO TABS: 150.0000 mg | ORAL_TABLET | Freq: Once | ORAL | 0 refills | Status: AC

## 2023-04-02 NOTE — Addendum Note (Signed)
Addended by: Selmer Dominion on: 04/02/2023 04:19 PM   Modules accepted: Orders

## 2023-04-03 ENCOUNTER — Ambulatory Visit (INDEPENDENT_AMBULATORY_CARE_PROVIDER_SITE_OTHER): Payer: Medicare Other | Admitting: Obstetrics and Gynecology

## 2023-04-03 ENCOUNTER — Encounter: Payer: Self-pay | Admitting: Obstetrics and Gynecology

## 2023-04-03 VITALS — BP 129/48 | HR 57

## 2023-04-03 DIAGNOSIS — L02215 Cutaneous abscess of perineum: Secondary | ICD-10-CM

## 2023-04-03 DIAGNOSIS — Z48816 Encounter for surgical aftercare following surgery on the genitourinary system: Secondary | ICD-10-CM

## 2023-04-03 NOTE — Progress Notes (Signed)
[facility]  Date of Visit: 04/03/2023  History of Present Illness: Ms. Penman is a 72 y.o. female scheduled today for a post-operative visit.   Surgery: s/p  le forte colpocleisis, posterior repair and perineorrhaphy, urethral bulking, cystoscopy on 03/19/23.   She has been taking antibiotics and pain has improved. She took antibiotics for 5 days. Easier to sit down. She did take a diflucan because she was concerned about a yeast infection due to some white discharge. She was using the estrogen cream but stopped with the infection.   Culture 03/29/23 returned with E.Coli and she was prescripted ciprofloxacin x 5 days.   Medications: She has a current medication list which includes the following prescription(s): acetaminophen, acyclovir, vitamin c with rose hips, aspirin ec, atorvastatin, cholecalciferol, ciprofloxacin, ezetimibe, levothyroxine, metoprolol tartrate, multivitamin, and omeprazole.   Allergies: Patient is allergic to other, contrast media [iodinated contrast media], povidone-iodine, and scopolamine.   Physical Exam: BP (!) 129/48   Pulse (!) 57    Perineum healing well. No fluctuance or erythema present. Slight white discharge present   POP-Q: deferred  ---------------------------------------------------------  Assessment and Plan:  1. Perineal abscess    - Abscess healed - Take second diflucan for yeast - restart vaginal estrogen cream on the perineal area twice a week  Return for post op  Marguerita Beards, MD

## 2023-04-05 ENCOUNTER — Encounter: Payer: Self-pay | Admitting: Cardiology

## 2023-04-07 ENCOUNTER — Encounter: Payer: Self-pay | Admitting: Obstetrics and Gynecology

## 2023-04-10 NOTE — Patient Instructions (Incomplete)
It was good to see you again today- I hope your knee is getting better over the next couple of weeks.  Will get x-rays today to get an idea of degree of arthritis.  Continue ice, Voltaren gel, Tylenol, try using a knee sleeve for support and stability  If you are not seeing improvement however I can get you seen by sports medicine or orthopedics per your preference

## 2023-04-10 NOTE — Progress Notes (Unsigned)
[facility] [address] [place], [place] [address] [phone]  Date:  04/11/2023   Name:  [name]   DOB:  Mar 23, 1951   MRN:  [id number]  PCP:  Pearline Cables, MD    Chief Complaint: No chief complaint on file.   History of Present Illness:  [name] is a 72 y.o. very pleasant female patient who presents with the following:  Patient seen today with concern of left knee pain- history of NSTEMI and CABG in 2019, hypothyroidism, dyslipidemia, GERD, borderline prediabetes  Most recent visit with myself was in April Earlier this month she underwent surgery to repair vaginal and uterine prolapse per Dr. 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 09/12/2022   Dermatochalasis of both upper eyelids 07/27/2022   Cystoid macular edema of left eye 07/27/2022   Astigmatism with presbyopia, bilateral 07/27/2022   Arcus senilis of both eyes 07/27/2022   Poor dentition 12/28/2021   Arthritis    Chicken pox    Genital herpes    Hepatitis B    Hypothyroidism    Kidney stone    Prediabetes 03/11/2020   Peripheral vascular disease, unspecified (HCC) right subclavian artery stenosis 04/18/2019   Closed fracture of left wrist 12/13/2018   S/P CABG x 4 04/15/18 04/17/2018   NSTEMI (non-ST elevated myocardial infarction) (HCC) 04/15/2018   Bilateral carotid bruits    Systolic murmur    Pseudophakia of both eyes 02/18/2018   PVD (posterior vitreous detachment), bilateral 02/18/2018   Branch retinal vein occlusion of left eye with macular edema 01/09/2018   Osteopenia 11/30/2017   GERD (gastroesophageal reflux disease) 06/11/2016   Osteoarthritis 06/11/2016   Hypothyroidism (acquired) 06/11/2016   Dyslipidemia 06/11/2016   HSV-2 (herpes simplex virus 2)  infection 06/11/2016   Diverticulosis of colon 05/16/2015    Past Medical History:  Diagnosis Date   Allergy 1979   Arthritis    Bilateral carotid bruits    1-39% bilateral ICA stenosis   Blood transfusion without reported diagnosis    Branch retinal vein occlusion of left eye with macular edema 01/09/2018   Cataract 2016   Bilateral cataract surgery 2017   Chicken pox    Closed fracture dislocation of lunate bone of right wrist    1970's   Closed fracture of left wrist 12/13/2018   Formatting of this note might be different from the original. Added automatically from request for surgery [id number]   Diverticulosis of colon 05/16/2015   Dyslipidemia    Genital herpes    GERD (gastroesophageal reflux disease)    Hepatitis B    HSV-2 (herpes simplex virus 2) infection 06/11/2016   Hyperlipidemia 1974   On meds   Hypothyroidism (acquired) 06/11/2016   Kidney stone    NSTEMI (non-ST elevated myocardial infarction) (HCC) 04/15/2018   Troponin 0.58 on 04/15/18   Osteoarthritis 06/11/2016   Osteopenia 11/30/2017   Pancreatitis    yrs ago   Peripheral vascular disease, unspecified (HCC) right subclavian artery stenosis 04/18/2019   PONV (postoperative nausea and vomiting)    Prediabetes 03/11/2020   Pseudophakia of both eyes 02/18/2018   PVD (posterior vitreous detachment), bilateral 02/18/2018   S/P CABG x 4 04/15/18 04/17/2018   LIMA to LAD SVG to DIAGONAL SVG to OM SVG  to RCA   Substance abuse [facility]) 1964   Clean and sober since 07/29/1989   Systolic murmur     Past Surgical History:  Procedure Laterality Date   APPENDECTOMY  1972   clavicle repair 1970's right     COLPOCLEISIS N/A 03/19/2023   Procedure: Ignatius Specking COLPOCLEISIS;  Surgeon: Marguerita Beards, MD;  Location: [facility];  Service: Gynecology;  Laterality: N/A;   CORONARY ARTERY BYPASS GRAFT N/A 04/17/2018   Procedure: CORONARY ARTERY BYPASS GRAFTING (CABG) TIMES USING LEFT INTERNAL MAMMARY  ARTERY TO LAD AND SAPHENOUS VEIN TO DIAG 1, OM1, AND RCA;  Surgeon: Delight Ovens, MD;  Location: [facility] OR;  Service: Open Heart Surgery;  Laterality: N/A;   CYSTOSCOPY N/A 03/19/2023   Procedure: CYSTOSCOPY;  Surgeon: Marguerita Beards, MD;  Location[facility];  Service: Gynecology;  Laterality: N/A;   ENDOVEIN HARVEST OF GREATER SAPHENOUS VEIN Right 04/17/2018   Procedure: ENDOVEIN HARVEST OF GREATER SAPHENOUS VEIN RIGHT THIGH AND CALF;  Surgeon: Delight Ovens, MD;  Location: [facility] OR;  Service: Open Heart Surgery;  Laterality: Right;   EYE SURGERY  11/2015   Bilateral cataract surgery   FRACTURE SURGERY  R clavicle fx 1989?   Repair failed   LAPAROSCOPY     LEFT HEART CATH AND CORONARY ANGIOGRAPHY N/A 04/16/2018   Procedure: LEFT HEART CATH AND CORONARY ANGIOGRAPHY;  Surgeon: Kathleene Hazel, MD;  Location: [facility];  Service: Cardiovascular;  Laterality: N/A;   OVARIAN CYST SURGERY     RECTOCELE REPAIR N/A 03/19/2023   Procedure: LEVATOR PLICATION and PERINEORRHAPHY;  Surgeon: Marguerita Beards, MD;  Location: [facility];  Service: Gynecology;  Laterality: N/A;   TEE WITHOUT CARDIOVERSION N/A 04/17/2018   Procedure: TRANSESOPHAGEAL ECHOCARDIOGRAM (TEE);  Surgeon: Delight Ovens, MD;  Location: [facility] OR;  Service: Open Heart Surgery;  Laterality: N/A;   TONSILLECTOMY     WRIST SURGERY Left 11/2018   Plate placement.    Social History   Tobacco Use   Smoking status: Former    Current packs/day: 0.00    Average packs/day: 3.0 packs/day for 28.0 years (84.0 ttl pk-yrs)    Types: Cigarettes    Start date: 04/22/1963    Quit date: 04/22/1991    Years since quitting: 31.9   Smokeless tobacco: Never  Vaping Use   Vaping status: Never Used  Substance Use Topics   Alcohol use: Not Currently    Comment: Last drink 07/1989   Drug use: Not Currently    Types: Amphetamines, Barbituates, Benzodiazepines, "Crack" cocaine, Cocaine,  Heroin, LSD, Methaqualone, Psilocybin    Comment: clean and sober  since 1991    Family History  Problem Relation Age of Onset   Alcoholism Mother    Kidney disease Mother    Kidney Stones Mother    Liver cancer Mother    Cancer Mother    Vision loss Mother    Alcoholism Father    Hyperlipidemia Father    Heart disease Father    Heart failure Father    Alcohol abuse Father    Arthritis Father    Alcoholism Maternal Grandfather    Hearing loss Brother    Heart disease Brother    Hyperlipidemia Brother    Hypertension Brother     Allergies  Allergen Reactions   Other Anaphylaxis    CAT Scan dye allergy   Contrast Media [Iodinated Contrast Media]     Dye from salpingogram -  caused scarring   Povidone-Iodine Swelling, Dermatitis and Rash    Rash and swelling with betadine   Scopolamine Anxiety and Other (See Comments)    And halluncinations    Medication list has been reviewed and updated.  Current Outpatient Medications on File Prior to Visit  Medication Sig Dispense Refill   acetaminophen (TYLENOL) 500 MG tablet Take 500 mg by mouth every 6 (six) hours as needed for mild pain or moderate pain.     acyclovir (ZOVIRAX) 400 MG tablet Take 1 tablet (400 mg total) by mouth 2 (two) times daily. 180 tablet 3   Ascorbic Acid (VITAMIN C WITH ROSE HIPS) 500 MG tablet Take 500 mg by mouth daily.     aspirin EC 81 MG EC tablet Take 1 tablet (81 mg total) by mouth daily.     atorvastatin (LIPITOR) 80 MG tablet Take 1 tablet (80 mg total) by mouth daily. (Patient taking differently: Take 80 mg by mouth every evening.) 90 tablet 3   cholecalciferol (VITAMIN D3) 25 MCG (1000 UNIT) tablet Take 2,000 Units by mouth daily.     ezetimibe (ZETIA) 10 MG tablet Take 1 tablet (10 mg total) by mouth daily. (Patient taking differently: Take 10 mg by mouth every evening.) 90 tablet 3   levothyroxine (SYNTHROID) 88 MCG tablet Take 1 tablet (88 mcg total) by mouth daily before breakfast. 90 tablet 3    metoprolol tartrate (LOPRESSOR) 25 MG tablet Take 0.5 tablets (12.5 mg total) by mouth 2 (two) times daily. 90 tablet 2   Multiple Vitamin (MULTIVITAMIN) tablet Take 1 tablet by mouth every evening.     omeprazole (PRILOSEC) 20 MG capsule Take 1 capsule (20 mg total) by mouth daily. 90 capsule 3   No current facility-administered medications on file prior to visit.    Review of Systems:  As per HPI- otherwise negative.   Physical Examination: There were no vitals filed for this visit. There were no vitals filed for this visit. There is no height or weight on file to calculate BMI. Ideal Body Weight:    GEN: no acute distress. HEENT: Atraumatic, Normocephalic.  Ears and Nose: No external deformity. CV: RRR, No M/G/R. No JVD. No thrill. No extra heart sounds. PULM: CTA B, no wheezes, crackles, rhonchi. No retractions. No resp. distress. No accessory muscle use. ABD: S, NT, ND, +BS. No rebound. No HSM. EXTR: No c/c/e PSYCH: Normally interactive. Conversant.    Assessment and Plan: ***  Signed Abbe Amsterdam, MD

## 2023-04-11 ENCOUNTER — Ambulatory Visit (HOSPITAL_BASED_OUTPATIENT_CLINIC_OR_DEPARTMENT_OTHER)
Admission: RE | Admit: 2023-04-11 | Discharge: 2023-04-11 | Disposition: A | Discharge status: Home / Self Care | Payer: Medicare Other | Source: Ambulatory Visit | Attending: Family Medicine | Admitting: Family Medicine

## 2023-04-11 ENCOUNTER — Ambulatory Visit: Payer: Medicare Other | Admitting: Family Medicine

## 2023-04-11 ENCOUNTER — Encounter: Payer: Self-pay | Admitting: Family Medicine

## 2023-04-11 VITALS — BP 132/80 | HR 69 | Temp 98.2°F | Resp 18 | Ht 61.0 in | Wt 162.8 lb

## 2023-04-11 DIAGNOSIS — M25562 Pain in left knee: Secondary | ICD-10-CM | POA: Diagnosis not present

## 2023-04-15 ENCOUNTER — Encounter: Payer: Self-pay | Admitting: Obstetrics and Gynecology

## 2023-04-16 ENCOUNTER — Telehealth: Payer: Self-pay

## 2023-04-16 NOTE — Telephone Encounter (Signed)
[name] is a 72 y.o. female called in saying she is in quite a but of pain and still has bleeding. Pt was placed on Kaitlin's schedule for 12/5/

## 2023-04-17 ENCOUNTER — Encounter: Payer: Self-pay | Admitting: Obstetrics and Gynecology

## 2023-04-17 ENCOUNTER — Other Ambulatory Visit (HOSPITAL_COMMUNITY)
Admission: RE | Admit: 2023-04-17 | Discharge: 2023-04-17 | Disposition: A | Discharge status: Home / Self Care | Payer: Medicare Other | Source: Other Acute Inpatient Hospital | Attending: Obstetrics and Gynecology | Admitting: Obstetrics and Gynecology

## 2023-04-17 ENCOUNTER — Ambulatory Visit (INDEPENDENT_AMBULATORY_CARE_PROVIDER_SITE_OTHER): Payer: Medicare Other | Admitting: Obstetrics and Gynecology

## 2023-04-17 VITALS — BP 138/85 | HR 67

## 2023-04-17 DIAGNOSIS — Z48816 Encounter for surgical aftercare following surgery on the genitourinary system: Secondary | ICD-10-CM

## 2023-04-17 DIAGNOSIS — T148XXA Other injury of unspecified body region, initial encounter: Secondary | ICD-10-CM | POA: Insufficient documentation

## 2023-04-17 DIAGNOSIS — T8149XA Infection following a procedure, other surgical site, initial encounter: Secondary | ICD-10-CM

## 2023-04-17 NOTE — Progress Notes (Signed)
[facility]  Date of Visit: 04/17/2023  History of Present Illness: [name] is a 72 y.o. female scheduled today for a post-operative visit.   Surgery: s/p  le forte colpocleisis, posterior repair and perineorrhaphy, urethral bulking, cystoscopy on 03/19/23.   Pain has increased recently. Feels that perineal incision was better but now has increased in pain and she has seen more drainage. She is using the clobetasol on the vulva. Not using the estrogen cream  Culture 03/29/23 returned with E.Coli and she was prescripted ciprofloxacin x 5 days.   Medications: She has a current medication list which includes the following prescription(s): acetaminophen, acyclovir, vitamin c with rose hips, aspirin ec, atorvastatin, cholecalciferol, ezetimibe, levothyroxine, metoprolol tartrate, multivitamin, and omeprazole.   Allergies: Patient is allergic to other, contrast media [iodinated contrast media], povidone-iodine, and scopolamine.   Physical Exam: BP 138/85   Pulse 67    Slight yellow discharge with opening of perineal incision superiorly. Culture obtained. Knot removed from top of perineum with scissors, hemostasis achieved with silver nitrate.   POP-Q: deferred  ---------------------------------------------------------  Assessment and Plan:  1. Wound drainage    - Culture obtained.  - restart cipro- had been over-prescribed previously so still has a few days left. Continue until culture returns.  - Restart estrace cream nightly.   Return 2 weeks  Kimberly Beards, MD

## 2023-04-19 ENCOUNTER — Ambulatory Visit: Payer: Medicare Other | Admitting: Obstetrics and Gynecology

## 2023-04-20 ENCOUNTER — Encounter: Payer: Self-pay | Admitting: Obstetrics and Gynecology

## 2023-04-20 LAB — AEROBIC/ANAEROBIC CULTURE W GRAM STAIN (SURGICAL/DEEP WOUND)

## 2023-05-01 ENCOUNTER — Encounter: Payer: Self-pay | Admitting: Obstetrics and Gynecology

## 2023-05-01 ENCOUNTER — Ambulatory Visit (INDEPENDENT_AMBULATORY_CARE_PROVIDER_SITE_OTHER): Payer: Medicare Other | Admitting: Obstetrics and Gynecology

## 2023-05-01 VITALS — BP 109/65 | HR 61

## 2023-05-01 DIAGNOSIS — N3281 Overactive bladder: Secondary | ICD-10-CM

## 2023-05-01 DIAGNOSIS — T148XXA Other injury of unspecified body region, initial encounter: Secondary | ICD-10-CM

## 2023-05-01 MED ORDER — SULFAMETHOXAZOLE-TRIMETHOPRIM 800-160 MG PO TABS: 1.0000 | ORAL_TABLET | Freq: Two times a day (BID) | ORAL | 0 refills | Status: AC

## 2023-05-01 MED ORDER — TROSPIUM CHLORIDE ER 60 MG PO CP24: 1.0000 | ORAL_CAPSULE | Freq: Every day | ORAL | 5 refills | Status: DC

## 2023-05-01 MED ORDER — TROSPIUM CHLORIDE 20 MG PO TABS: 20.0000 mg | ORAL_TABLET | Freq: Two times a day (BID) | ORAL | 5 refills | Status: DC

## 2023-05-01 NOTE — Progress Notes (Signed)
[facility]  Date of Visit: 05/01/2023  History of Present Illness: [name] is a 72 y.o. female scheduled today for a post-operative visit.   Surgery: s/p  le forte colpocleisis, posterior repair and perineorrhaphy, urethral bulking, cystoscopy on 03/19/23.   She took 4 more days of cipro. Still feels some discomfort but it has improved. Still seeing some discharge. Using estrace cream. Still also having some leakage with cough/ sneeze. Has dribbling on the way to the restroom.   Culture 03/29/23 returned with E.Coli and repeat culture 12/3 showed E.Coli again.   Medications: She has a current medication list which includes the following prescription(s): acetaminophen, acyclovir, vitamin c with rose hips, aspirin ec, atorvastatin, cholecalciferol, ezetimibe, levothyroxine, metoprolol tartrate, multivitamin, omeprazole, and sulfamethoxazole-trimethoprim.   Allergies: Patient is allergic to other, contrast media [iodinated contrast media], povidone-iodine, and scopolamine.   Physical Exam: BP 109/65   Pulse 61    Continued yellow discharge eminating from incision.   POP-Q: deferred  ---------------------------------------------------------  Assessment and Plan:  1. Wound drainage   2. Overactive bladder    - Will treat with a week of bactrim DS BID due to appearance of continued infection. Susceptible based on prior cultures - Continue estrace cream twice a week - Will start trospium 20mg  BID for OAB symptoms. She does not have medication coverage so BID dosing is less expensive with [facility]. We also discussed option of repeat urethral bulking as well.   Return 1 week  Marguerita Beards, MD

## 2023-05-11 ENCOUNTER — Ambulatory Visit (INDEPENDENT_AMBULATORY_CARE_PROVIDER_SITE_OTHER): Payer: Medicare Other | Admitting: Obstetrics and Gynecology

## 2023-05-11 ENCOUNTER — Encounter: Payer: Self-pay | Admitting: Obstetrics and Gynecology

## 2023-05-11 VITALS — BP 115/63 | HR 61

## 2023-05-11 DIAGNOSIS — Z48816 Encounter for surgical aftercare following surgery on the genitourinary system: Secondary | ICD-10-CM

## 2023-05-11 DIAGNOSIS — Z9889 Other specified postprocedural states: Secondary | ICD-10-CM

## 2023-05-11 NOTE — Progress Notes (Signed)
 [facility]  Date of Visit: 05/11/2023  History of Present Illness: Ms. Morath is a 72 y.o. female scheduled today for a post-operative visit.   Surgery: s/p  le forte colpocleisis, posterior repair and perineorrhaphy, urethral bulking, cystoscopy on 03/19/23.   Feels the bactrim helped a lot. Has not noticed any more drainage from incision. Pain minimal. She decided she does not want to take any medication for her leakage at this time- did not pick up the prescription.   Medications: She has a current medication list which includes the following prescription(s): acetaminophen, acyclovir, vitamin c with rose hips, aspirin ec, atorvastatin, cholecalciferol, ezetimibe, levothyroxine, metoprolol tartrate, multivitamin, omeprazole, and trospium.   Allergies: Patient is allergic to other, contrast media [iodinated contrast media], povidone-iodine, and scopolamine.   Physical Exam: BP 115/63   Pulse 61    Perineum well healed. Tiny dot of granulation tissue, treated with silver nitrate. Incisions well healed on speculum exam.   POP-Q: deferred  ---------------------------------------------------------  Assessment and Plan:  1. Post-operative state      - Continue estrace cream twice a week for another month, then can stop if she wants. We discussed she can continue for vaginal dryness if desired.  - For incontinence symptoms, offered pelvic PT but she declined. She would prefer to do some kegels on her own. She does not feel symptoms are that bothersome at this time.   Follow up as needed  Marguerita Beards, MD

## 2023-06-01 ENCOUNTER — Other Ambulatory Visit: Payer: Self-pay

## 2023-06-01 DIAGNOSIS — E034 Atrophy of thyroid (acquired): Secondary | ICD-10-CM

## 2023-06-01 MED ORDER — LEVOTHYROXINE SODIUM 88 MCG PO TABS: 88.0000 ug | ORAL_TABLET | Freq: Every day | ORAL | 3 refills | Status: DC

## 2023-07-24 DIAGNOSIS — H34832 Tributary (branch) retinal vein occlusion, left eye, with macular edema: Secondary | ICD-10-CM | POA: Diagnosis not present

## 2023-07-24 DIAGNOSIS — H35033 Hypertensive retinopathy, bilateral: Secondary | ICD-10-CM | POA: Diagnosis not present

## 2023-07-28 ENCOUNTER — Encounter: Payer: Self-pay | Admitting: Cardiology

## 2023-07-30 ENCOUNTER — Telehealth: Payer: Self-pay | Admitting: Cardiology

## 2023-07-30 ENCOUNTER — Other Ambulatory Visit: Payer: Self-pay

## 2023-07-30 MED ORDER — ATORVASTATIN CALCIUM 80 MG PO TABS: 80.0000 mg | ORAL_TABLET | Freq: Every day | ORAL | 3 refills | Status: AC

## 2023-07-30 NOTE — Telephone Encounter (Signed)
 LVM for pt to call and schedule 12 month fu with RJK ([place] Patient)/kbl 07/30/23

## 2023-07-30 NOTE — Telephone Encounter (Signed)
 Patient scheduled/kbl 07/30/23

## 2023-08-09 ENCOUNTER — Ambulatory Visit: Admitting: Cardiology

## 2023-08-15 NOTE — Patient Instructions (Incomplete)
 It was great to see you today, I will be in touch with your labs soon as possible.  Recommend a tetanus booster and COVID booster if none the last 6 months or so Also recommend a one-time dose of RSV vaccine at your pharmacy- I understand it is expensive right now!  Perhaps do later on   Please see me in about 6 months or so

## 2023-08-15 NOTE — Progress Notes (Unsigned)
 [facility] [address] [place], [place] [address] [phone]  Date:  08/16/2023   Name:  [name]   DOB:  [birth year]   MRN:  [id number]  PCP:  Pearline Cables, MD    Chief Complaint: No chief complaint on file.   History of Present Illness:  [name] is a 73 y.o. very pleasant female patient who presents with the following:  Patient seen today for follow-up and labs.  Most recent visit with myself was in November-  history of NSTEMI and CABG in 2019, hypothyroidism, dyslipidemia, GERD, borderline prediabetes  She had repair of vaginal and uterine prolapse in November 2024, doing well.  At her last visit she was having some left knee pain which we thought might have been due to positioning during her operation, it was getting better and knee films were reassuring  Tetanus booster due Recommend COVID booster Patient has declined mammogram previously  Lab Results  Component Value Date   HGBA1C 5.7 08/28/2022   Aspirin 81 Lipitor Zetia 10 Levothyroxine 88 Metoprolol 12.5 twice daily Prilosec 20 Some lab work done in November: BMP, hemoglobin hematocrit-otherwise she is due for update today  Patient Active Problem List   Diagnosis Date Noted   Steal syndrome, subclavian on the right 09/12/2022   Dermatochalasis of both upper eyelids 07/27/2022   Cystoid macular edema of left eye 07/27/2022   Astigmatism with presbyopia, bilateral 07/27/2022   Arcus senilis of both eyes 07/27/2022   Poor dentition 12/28/2021   Arthritis    Chicken pox    Genital herpes    Hepatitis B    Hypothyroidism    Kidney stone    Prediabetes 03/11/2020   Peripheral vascular disease, unspecified (HCC) right subclavian artery stenosis 04/18/2019   Closed fracture of left wrist 12/13/2018   S/P CABG x 4 04/15/18 04/17/2018   NSTEMI (non-ST elevated myocardial infarction) (HCC) 04/15/2018   Bilateral carotid bruits     Systolic murmur    Pseudophakia of both eyes 02/18/2018   PVD (posterior vitreous detachment), bilateral 02/18/2018   Branch retinal vein occlusion of left eye with macular edema 01/09/2018   Osteopenia 11/30/2017   GERD (gastroesophageal reflux disease) 06/11/2016   Osteoarthritis 06/11/2016   Hypothyroidism (acquired) 06/11/2016   Dyslipidemia 06/11/2016   HSV-2 (herpes simplex virus 2) infection 06/11/2016   Diverticulosis of colon 05/16/2015    Past Medical History:  Diagnosis Date   Allergy 1979   Arthritis    Bilateral carotid bruits    1-39% bilateral ICA stenosis   Blood transfusion without reported diagnosis    Branch retinal vein occlusion of left eye with macular edema 01/09/2018   Cataract 2016   Bilateral cataract surgery 2017   Chicken pox    Closed fracture dislocation of lunate bone of right wrist    1970's   Closed fracture of left wrist 12/13/2018   Formatting of this note might be different from the original. Added automatically from request for surgery [id number]   Diverticulosis of colon 05/16/2015   Dyslipidemia    Genital herpes    GERD (gastroesophageal reflux disease)    Hepatitis B    HSV-2 (herpes simplex virus 2) infection 06/11/2016   Hyperlipidemia 1974   On meds   Hypothyroidism (acquired) 06/11/2016   Kidney stone    NSTEMI (non-ST elevated myocardial infarction) (HCC) 04/15/2018   Troponin 0.58 on 04/15/18   Osteoarthritis 06/11/2016   Osteopenia 11/30/2017  Pancreatitis    yrs ago   Peripheral vascular disease, unspecified (HCC) right subclavian artery stenosis 04/18/2019   PONV (postoperative nausea and vomiting)    Prediabetes 03/11/2020   Pseudophakia of both eyes 02/18/2018   PVD (posterior vitreous detachment), bilateral 02/18/2018   S/P CABG x 4 04/15/18 04/17/2018   LIMA to LAD SVG to DIAGONAL SVG to OM SVG to RCA   Substance abuse (HCC) 1964   Clean and sober since 07/29/1989   Systolic murmur     Past Surgical History:   Procedure Laterality Date   APPENDECTOMY  1972   clavicle repair 1970's right     COLPOCLEISIS N/A 03/19/2023   Procedure: Ignatius Specking COLPOCLEISIS;  Surgeon: Marguerita Beards, MD;  Location: [facility];  Service: Gynecology;  Laterality: N/A;   CORONARY ARTERY BYPASS GRAFT N/A 04/17/2018   Procedure: CORONARY ARTERY BYPASS GRAFTING (CABG) TIMES USING LEFT INTERNAL MAMMARY ARTERY TO LAD AND SAPHENOUS VEIN TO DIAG 1, OM1, AND RCA;  Surgeon: Delight Ovens, MD;  Location: [facility] OR;  Service: Open Heart Surgery;  Laterality: N/A;   CYSTOSCOPY N/A 03/19/2023   Procedure: CYSTOSCOPY;  Surgeon: Marguerita Beards, MD;  Location: [facility];  Service: Gynecology;  Laterality: N/A;   ENDOVEIN HARVEST OF GREATER SAPHENOUS VEIN Right 04/17/2018   Procedure: ENDOVEIN HARVEST OF GREATER SAPHENOUS VEIN RIGHT THIGH AND CALF;  Surgeon: Delight Ovens, MD;  [facility];  Service: Open Heart Surgery;  Laterality: Right;   EYE SURGERY  11/2015   Bilateral cataract surgery   FRACTURE SURGERY  R clavicle fx 1989?   Repair failed   LAPAROSCOPY     LEFT HEART CATH AND CORONARY ANGIOGRAPHY N/A 04/16/2018   Procedure: LEFT HEART CATH AND CORONARY ANGIOGRAPHY;  Surgeon: Kathleene Hazel, MD;  Location: [facility] INV[facility];  Service: Cardiovascular;  Laterality: N/A;   OVARIAN CYST SURGERY     RECTOCELE REPAIR N/A 03/19/2023   Procedure: LEVATOR PLICATION and PERINEORRHAPHY;  Surgeon: Marguerita Beards, MD;  Location: [facility];  Service: Gynecology;  Laterality: N/A;   TEE WITHOUT CARDIOVERSION N/A 04/17/2018   Procedure: TRANSESOPHAGEAL ECHOCARDIOGRAM (TEE);  Surgeon: Delight Ovens, MD;  Location: [facility] OR;  Service: Open Heart Surgery;  Laterality: N/A;   TONSILLECTOMY     WRIST SURGERY Left 11/2018   Plate placement.    Social History   Tobacco Use   Smoking status: Former    Current packs/day: 0.00    Average packs/day: 3.0  packs/day for 28.0 years (84.0 ttl pk-yrs)    Types: Cigarettes    Start date: 04/22/1963    Quit date: 04/22/1991    Years since quitting: 32.3   Smokeless tobacco: Never  Vaping Use   Vaping status: Never Used  Substance Use Topics   Alcohol use: Not Currently    Comment: Last drink 07/1989   Drug use: Not Currently    Types: Amphetamines, Barbituates, Benzodiazepines, "Crack" cocaine, Cocaine, Heroin, LSD, Methaqualone, Psilocybin    Comment: clean and sober  since 1991    Family History  Problem Relation Age of Onset   Alcoholism Mother    Kidney disease Mother    Kidney Stones Mother    Liver cancer Mother    Cancer Mother    Vision loss Mother    Alcoholism Father    Hyperlipidemia Father    Heart disease Father    Heart failure Father    Alcohol abuse Father  Arthritis Father    Alcoholism Maternal Grandfather    Hearing loss Brother    Heart disease Brother    Hyperlipidemia Brother    Hypertension Brother     Allergies  Allergen Reactions   Other Anaphylaxis    CAT Scan dye allergy   Contrast Media [Iodinated Contrast Media]     Dye from salpingogram - caused scarring   Povidone-Iodine Swelling, Dermatitis and Rash    Rash and swelling with betadine   Scopolamine Anxiety and Other (See Comments)    And halluncinations    Medication list has been reviewed and updated.  Current Outpatient Medications on File Prior to Visit  Medication Sig Dispense Refill   acetaminophen (TYLENOL) 500 MG tablet Take 500 mg by mouth every 6 (six) hours as needed for mild pain or moderate pain.     acyclovir (ZOVIRAX) 400 MG tablet Take 1 tablet (400 mg total) by mouth 2 (two) times daily. 180 tablet 3   Ascorbic Acid (VITAMIN C WITH ROSE HIPS) 500 MG tablet Take 500 mg by mouth daily.     aspirin EC 81 MG EC tablet Take 1 tablet (81 mg total) by mouth daily.     atorvastatin (LIPITOR) 80 MG tablet Take 1 tablet (80 mg total) by mouth daily. 90 tablet 3   cholecalciferol  (VITAMIN D3) 25 MCG (1000 UNIT) tablet Take 2,000 Units by mouth daily.     ezetimibe (ZETIA) 10 MG tablet Take 1 tablet (10 mg total) by mouth daily. (Patient taking differently: Take 10 mg by mouth every evening.) 90 tablet 3   levothyroxine (SYNTHROID) 88 MCG tablet Take 1 tablet (88 mcg total) by mouth daily before breakfast. 90 tablet 3   metoprolol tartrate (LOPRESSOR) 25 MG tablet Take 0.5 tablets (12.5 mg total) by mouth 2 (two) times daily. 90 tablet 2   Multiple Vitamin (MULTIVITAMIN) tablet Take 1 tablet by mouth every evening.     omeprazole (PRILOSEC) 20 MG capsule Take 1 capsule (20 mg total) by mouth daily. 90 capsule 3   trospium (SANCTURA) 20 MG tablet Take 1 tablet (20 mg total) by mouth 2 (two) times daily. 60 tablet 5   No current facility-administered medications on file prior to visit.    Review of Systems:  As per HPI- otherwise negative.   Physical Examination: There were no vitals filed for this visit. There were no vitals filed for this visit. There is no height or weight on file to calculate BMI. Ideal Body Weight:    GEN: no acute distress. HEENT: Atraumatic, Normocephalic.  Ears and Nose: No external deformity. CV: RRR, No M/G/R. No JVD. No thrill. No extra heart sounds. PULM: CTA B, no wheezes, crackles, rhonchi. No retractions. No resp. distress. No accessory muscle use. ABD: S, NT, ND, +BS. No rebound. No HSM. EXTR: No c/c/e PSYCH: Normally interactive. Conversant.    Assessment and Plan: ***  Signed Abbe Amsterdam, MD

## 2023-08-16 ENCOUNTER — Ambulatory Visit (INDEPENDENT_AMBULATORY_CARE_PROVIDER_SITE_OTHER): Admitting: Family Medicine

## 2023-08-16 VITALS — BP 120/82 | HR 57 | Temp 97.7°F | Resp 18 | Ht 61.0 in | Wt 165.5 lb

## 2023-08-16 DIAGNOSIS — Z9889 Other specified postprocedural states: Secondary | ICD-10-CM | POA: Diagnosis not present

## 2023-08-16 DIAGNOSIS — E034 Atrophy of thyroid (acquired): Secondary | ICD-10-CM | POA: Diagnosis not present

## 2023-08-16 DIAGNOSIS — Z130 Encounter for screening for diseases of the blood and blood-forming organs and certain disorders involving the immune mechanism: Secondary | ICD-10-CM | POA: Diagnosis not present

## 2023-08-16 DIAGNOSIS — E785 Hyperlipidemia, unspecified: Secondary | ICD-10-CM | POA: Diagnosis not present

## 2023-08-16 DIAGNOSIS — R7303 Prediabetes: Secondary | ICD-10-CM | POA: Diagnosis not present

## 2023-08-16 DIAGNOSIS — R710 Precipitous drop in hematocrit: Secondary | ICD-10-CM | POA: Diagnosis not present

## 2023-08-17 ENCOUNTER — Other Ambulatory Visit (HOSPITAL_BASED_OUTPATIENT_CLINIC_OR_DEPARTMENT_OTHER): Payer: Self-pay

## 2023-08-17 LAB — COMPREHENSIVE METABOLIC PANEL WITH GFR
ALT: 18 U/L (ref 0–35)
AST: 23 U/L (ref 0–37)
Albumin: 4.3 g/dL (ref 3.5–5.2)
Alkaline Phosphatase: 78 U/L (ref 39–117)
BUN: 13 mg/dL (ref 6–23)
CO2: 28 meq/L (ref 19–32)
Calcium: 9.5 mg/dL (ref 8.4–10.5)
Chloride: 98 meq/L (ref 96–112)
Creatinine, Ser: 0.7 mg/dL (ref 0.40–1.20)
GFR: 84.87 mL/min (ref >60.00)
Glucose, Bld: 82 mg/dL (ref 70–99)
Potassium: 4.6 meq/L (ref 3.5–5.1)
Sodium: 135 meq/L (ref 135–145)
Total Bilirubin: 0.6 mg/dL (ref 0.2–1.2)
Total Protein: 6.7 g/dL (ref 6.0–8.3)

## 2023-08-17 LAB — CBC
HCT: 41.7 % (ref 36.0–46.0)
Hemoglobin: 14.1 g/dL (ref 12.0–15.0)
MCHC: 33.8 g/dL (ref 30.0–36.0)
MCV: 101.7 fl — ABNORMAL HIGH (ref 78.0–100.0)
Platelets: 288 10*3/uL (ref 150.0–400.0)
RBC: 4.11 Mil/uL (ref 3.87–5.11)
RDW: 12 % (ref 11.5–15.5)
WBC: 6.1 10*3/uL (ref 4.0–10.5)

## 2023-08-17 LAB — TSH: TSH: 1.23 u[IU]/mL (ref 0.35–5.50)

## 2023-08-17 LAB — HEMOGLOBIN A1C: Hgb A1c MFr Bld: 6 % (ref 4.6–6.5)

## 2023-08-17 LAB — LIPID PANEL
Cholesterol: 142 mg/dL (ref 0–200)
HDL: 51.8 mg/dL (ref >39.00)
LDL Cholesterol: 66 mg/dL (ref 0–99)
NonHDL: 90.38
Total CHOL/HDL Ratio: 3
Triglycerides: 121 mg/dL (ref 0.0–149.0)
VLDL: 24.2 mg/dL (ref 0.0–40.0)

## 2023-08-22 ENCOUNTER — Telehealth: Payer: Self-pay | Admitting: Family Medicine

## 2023-08-22 NOTE — Telephone Encounter (Signed)
 Copied from CRM [id number]. Topic: Medicare AWV >> Aug 22, 2023  3:15 PM Payton Doughty wrote: Reason for CRM: Called LVM 08/22/2023 to schedule AWV. Please schedule Virtual or Telehealth visits ONLY.   Verlee Rossetti; Care Guide Ambulatory Clinical Support [facility] Direct Dial: [phone]

## 2023-08-24 ENCOUNTER — Other Ambulatory Visit: Payer: Self-pay | Admitting: Cardiology

## 2023-09-14 ENCOUNTER — Ambulatory Visit: Admitting: Cardiology

## 2023-09-24 ENCOUNTER — Encounter: Payer: Self-pay | Admitting: Cardiology

## 2023-09-24 ENCOUNTER — Ambulatory Visit: Attending: Cardiology | Admitting: Cardiology

## 2023-09-24 VITALS — BP 106/66 | HR 56 | Ht 59.0 in | Wt 165.0 lb

## 2023-09-24 DIAGNOSIS — Z951 Presence of aortocoronary bypass graft: Secondary | ICD-10-CM | POA: Diagnosis not present

## 2023-09-24 DIAGNOSIS — G458 Other transient cerebral ischemic attacks and related syndromes: Secondary | ICD-10-CM

## 2023-09-24 DIAGNOSIS — R011 Cardiac murmur, unspecified: Secondary | ICD-10-CM

## 2023-09-24 DIAGNOSIS — E785 Hyperlipidemia, unspecified: Secondary | ICD-10-CM

## 2023-09-24 DIAGNOSIS — I739 Peripheral vascular disease, unspecified: Secondary | ICD-10-CM

## 2023-09-24 NOTE — Addendum Note (Signed)
 Addended by: Aurelio Leer I on: 09/24/2023 10:23 AM   Modules accepted: Orders

## 2023-09-24 NOTE — Progress Notes (Signed)
 Cardiology Office Note:    Date:  09/24/2023   ID:  [name], DOB [birth year], MRN [id number]  PCP:  Kaylee Partridge, MD  Cardiologist:  Ralene Burger, MD    Referring MD: Kaylee Partridge, MD   Chief Complaint  Patient presents with   Follow-up    History of Present Illness:    [name] is a 73 y.o. female past medical history significant for coronary artery disease, status post coronary bypass graft done in April 15, 2018 with LIMA to LAD, SVG to diagonal, SVG to obtuse marginal, SVG to RCA, peripheral vascular disease still symptomatic right side, dyslipidemia, essential hypertension, prediabetes. Comes today 2 months for follow-up she is doing great goes to gym on the regular basis exercise with no difficulties.  Denies have any chest pain tightness squeezing pressure burning chest no palpitation dizziness swelling of lower extremities  Past Medical History:  Diagnosis Date   Allergy 1979   Arthritis    Bilateral carotid bruits    1-39% bilateral ICA stenosis   Blood transfusion without reported diagnosis    Branch retinal vein occlusion of left eye with macular edema 01/09/2018   Cataract 2016   Bilateral cataract surgery 2017   Chicken pox    Closed fracture dislocation of lunate bone of right wrist    1970's   Closed fracture of left wrist 12/13/2018   Formatting of this note might be different from the original. Added automatically from request for surgery [id number]   Diverticulosis of colon 05/16/2015   Dyslipidemia    Genital herpes    GERD (gastroesophageal reflux disease)    Hepatitis B    HSV-2 (herpes simplex virus 2) infection 06/11/2016   Hyperlipidemia 1974   On meds   Hypothyroidism (acquired) 06/11/2016   Kidney stone    NSTEMI (non-ST elevated myocardial infarction) (HCC) 04/15/2018   Troponin 0.58 on 04/15/18   Osteoarthritis 06/11/2016   Osteopenia 11/30/2017   Pancreatitis    yrs ago   Peripheral vascular disease, unspecified  (HCC) right subclavian artery stenosis 04/18/2019   PONV (postoperative nausea and vomiting)    Prediabetes 03/11/2020   Pseudophakia of both eyes 02/18/2018   PVD (posterior vitreous detachment), bilateral 02/18/2018   S/P CABG x 4 04/15/18 04/17/2018   LIMA to LAD SVG to DIAGONAL SVG to OM SVG to RCA   Substance abuse (HCC) 1964   Clean and sober since 07/29/1989   Systolic murmur     Past Surgical History:  Procedure Laterality Date   APPENDECTOMY  1972   clavicle repair 1970's right     COLPOCLEISIS N/A 03/19/2023   Procedure: Ricki Charon COLPOCLEISIS;  Surgeon: Arma Lamp, MD;  Location: [facility];  Service: Gynecology;  Laterality: N/A;   CORONARY ARTERY BYPASS GRAFT N/A 04/17/2018   Procedure: CORONARY ARTERY BYPASS GRAFTING (CABG) TIMES USING LEFT INTERNAL MAMMARY ARTERY TO LAD AND SAPHENOUS VEIN TO DIAG 1, OM1, AND RCA;  Surgeon: Norita Beauvais, MD;  Location: [facility] OR;  Service: Open Heart Surgery;  Laterality: N/A;   CYSTOSCOPY N/A 03/19/2023   Procedure: CYSTOSCOPY;  Surgeon: Arma Lamp, MD;  Location: [facility];  Service: Gynecology;  Laterality: N/A;   ENDOVEIN HARVEST OF GREATER SAPHENOUS VEIN Right 04/17/2018   Procedure: ENDOVEIN HARVEST OF GREATER SAPHENOUS VEIN RIGHT THIGH AND CALF;  Surgeon: Norita Beauvais, MD;  Location: [facility] OR;  Service: Open Heart Surgery;  Laterality: Right;   EYE SURGERY  11/2015  Bilateral cataract surgery   FRACTURE SURGERY  R clavicle fx 1989?   Repair failed   LAPAROSCOPY     LEFT HEART CATH AND CORONARY ANGIOGRAPHY N/A 04/16/2018   Procedure: LEFT HEART CATH AND CORONARY ANGIOGRAPHY;  Surgeon: Odie Benne, MD;  Location: [facility] INVAS[facility] LAB;  Service: Cardiovascular;  Laterality: N/A;   OVARIAN CYST SURGERY     RECTOCELE REPAIR N/A 03/19/2023   Procedure: LEVATOR PLICATION and PERINEORRHAPHY;  Surgeon: Arma Lamp, MD;  Location[facility];   Service: Gynecology;  Laterality: N/A;   TEE WITHOUT CARDIOVERSION N/A 04/17/2018   Procedure: TRANSESOPHAGEAL ECHOCARDIOGRAM (TEE);  Surgeon: Norita Beauvais, MD;  Location: [facility] OR;  Service: Open Heart Surgery;  Laterality: N/A;   TONSILLECTOMY     WRIST SURGERY Left 11/2018   Plate placement.    Current Medications: Current Meds  Medication Sig   acetaminophen  (TYLENOL ) 500 MG tablet Take 500 mg by mouth every 6 (six) hours as needed for mild pain or moderate pain.   acyclovir  (ZOVIRAX ) 400 MG tablet Take 1 tablet (400 mg total) by mouth 2 (two) times daily.   Ascorbic Acid (VITAMIN C WITH ROSE HIPS) 500 MG tablet Take 500 mg by mouth daily.   aspirin  EC 81 MG EC tablet Take 1 tablet (81 mg total) by mouth daily.   atorvastatin  (LIPITOR ) 80 MG tablet Take 1 tablet (80 mg total) by mouth daily.   cholecalciferol (VITAMIN D3) 25 MCG (1000 UNIT) tablet Take 2,000 Units by mouth daily.   ezetimibe  (ZETIA ) 10 MG tablet Take 1 tablet (10 mg total) by mouth daily. (Patient taking differently: Take 10 mg by mouth every evening.)   levothyroxine  (SYNTHROID ) 88 MCG tablet Take 1 tablet (88 mcg total) by mouth daily before breakfast.   Magnesium  100 MG CAPS Take 1 capsule by mouth every other day.   metoprolol  tartrate (LOPRESSOR ) 25 MG tablet Take 0.5 tablets (12.5 mg total) by mouth 2 (two) times daily. Patient must keep appointment on 09/24/23 for further refills. 1 st attempt   Multiple Vitamin (MULTIVITAMIN) tablet Take 1 tablet by mouth every evening.   omeprazole  (PRILOSEC) 20 MG capsule Take 1 capsule (20 mg total) by mouth daily.   Zinc Sulfate (ZINC 15 PO) Take 1 tablet by mouth daily.     Allergies:   Other, Contrast media [iodinated contrast media], Povidone-iodine, and Scopolamine   Social History   Socioeconomic History   Marital status: Divorced    Spouse name: Not on file   Number of children: Not on file   Years of education: Not on file   Highest education level: Some  college, no degree  Occupational History   Not on file  Tobacco Use   Smoking status: Former    Current packs/day: 0.00    Average packs/day: 3.0 packs/day for 28.0 years (84.0 ttl pk-yrs)    Types: Cigarettes    Start date: 04/22/1963    Quit date: 04/22/1991    Years since quitting: 32.4   Smokeless tobacco: Never  Vaping Use   Vaping status: Never Used  Substance and Sexual Activity   Alcohol use: Not Currently    Comment: Last drink 07/1989   Drug use: Not Currently    Types: Amphetamines, Barbituates, Benzodiazepines, "Crack" cocaine, Cocaine, Heroin, LSD, Methaqualone, Psilocybin    Comment: clean and sober  since 1991   Sexual activity: Not Currently    Birth control/protection: Post-menopausal  Other Topics Concern   Not on file  Social History Narrative   Not on file   Social Drivers of Health   Financial Resource Strain: Low Risk  (08/11/2023)   Overall Financial Resource Strain (CARDIA)    Difficulty of Paying Living Expenses: Not hard at all  Food Insecurity: No Food Insecurity (08/11/2023)   Hunger Vital Sign    Worried About Running Out of Food in the Last Year: Never true    Ran Out of Food in the Last Year: Never true  Transportation Needs: No Transportation Needs (08/11/2023)   PRAPARE - Administrator, Civil Service (Medical): No    Lack of Transportation (Non-Medical): No  Physical Activity: Insufficiently Active (08/11/2023)   Exercise Vital Sign    Days of Exercise per Week: 5 days    Minutes of Exercise per Session: 20 min  Stress: No Stress Concern Present (08/11/2023)   [employer] of Occupational Health - Occupational Stress Questionnaire    Feeling of Stress : Not at all  Social Connections: Unknown (08/11/2023)   Social Connection and Isolation Panel [NHANES]    Frequency of Communication with Friends and Family: More than three times a week    Frequency of Social Gatherings with Friends and Family: More than three times a week     Attends Religious Services: Patient declined    Database administrator or Organizations: Yes    Attends Engineer, structural: More than 4 times per year    Marital Status: Divorced     Family History: The patient's family history includes Alcohol abuse in her father; Alcoholism in her father, maternal grandfather, and mother; Arthritis in her father; Cancer in her mother; Hearing loss in her brother; Heart disease in her brother and father; Heart failure in her father; Hyperlipidemia in her brother and father; Hypertension in her brother; Kidney Stones in her mother; Kidney disease in her mother; Liver cancer in her mother; Vision loss in her mother. ROS:   Please see the history of present illness.    All 14 point review of systems negative except as described per history of present illness  EKGs/Labs/Other Studies Reviewed:    EKG Interpretation Date/Time:  Monday Sep 24 2023 09:48:26 EDT Ventricular Rate:  56 PR Interval:  160 QRS Duration:  80 QT Interval:  440 QTC Calculation: 424 R Axis:   9  Text Interpretation: Sinus bradycardia When compared with ECG of 18-Apr-2018 07:06, Vent. rate has decreased BY  36 BPM ST no longer elevated in Anterior leads Nonspecific T wave abnormality no longer evident in Lateral leads Confirmed by Ralene Burger [phone]) on 09/24/2023 10:04:07 AM    Recent Labs: 08/16/2023: ALT 18; BUN 13; Creatinine, Ser 0.70; Hemoglobin 14.1; Platelets 288.0; Potassium 4.6; Sodium 135; TSH 1.23  Recent Lipid Panel    Component Value Date/Time   CHOL 142 08/16/2023 1327   CHOL 145 11/21/2021 1043   TRIG 121.0 08/16/2023 1327   HDL 51.80 08/16/2023 1327   HDL 53 11/21/2021 1043   CHOLHDL 3 08/16/2023 1327   VLDL 24.2 08/16/2023 1327   LDLCALC 66 08/16/2023 1327   LDLCALC 69 11/21/2021 1043    Physical Exam:    VS:  BP 106/66 (BP Location: Right Arm, Patient Position: Sitting)   Pulse (!) 56   Ht 4\' 11"  (1.499 m)   Wt 165 lb (74.8 kg)   SpO2  97%   BMI 33.33 kg/m     Wt Readings from Last 3 Encounters:  09/24/23 165 lb (74.8 kg)  08/16/23 165 lb 8 oz (75.1 kg)  04/11/23 162 lb 12.8 oz (73.8 kg)     GEN:  Well nourished, well developed in no acute distress HEENT: Normal NECK: No JVD; No carotid bruits LYMPHATICS: No lymphadenopathy CARDIAC: RRR, systolic ejection murmur grade 2/6 best heard right upper portion of the sternum, no rubs, no gallops RESPIRATORY:  Clear to auscultation without rales, wheezing or rhonchi  ABDOMEN: Soft, non-tender, non-distended MUSCULOSKELETAL:  No edema; No deformity  SKIN: Warm and dry LOWER EXTREMITIES: no swelling NEUROLOGIC:  Alert and oriented x 3 PSYCHIATRIC:  Normal affect   ASSESSMENT:    1. Systolic murmur   2. Peripheral vascular disease, unspecified (HCC) right subclavian artery stenosis   3. Steal syndrome, subclavian on the right   4. Dyslipidemia   5. S/P CABG x 4 04/15/18    PLAN:    In order of problems listed above:  Coronary disease status post coronary bypass graft, asymptomatic on guideline directed medical therapy which I will continue. Still syndrome on the right side.  Will repeat carotid ultrasound. Dyslipidemia I did review K PN which show me LDL 66 HDL 51 this is from 08/16/2023 good control continue present management. Systolic heart murmur, will repeat echocardiogram in another 6 months to year.   Medication Adjustments/Labs and Tests Ordered: Current medicines are reviewed at length with the patient today.  Concerns regarding medicines are outlined above.  Orders Placed This Encounter  Procedures   EKG 12-Lead   Medication changes: No orders of the defined types were placed in this encounter.   Signed, Manfred Seed, MD, [facility] 09/24/2023 10:15 AM    [facility]

## 2023-09-24 NOTE — Patient Instructions (Signed)
 Medication Instructions:  Your physician recommends that you continue on your current medications as directed. Please refer to the Current Medication list given to you today.  *If you need a refill on your cardiac medications before your next appointment, please call your pharmacy*  Lab Work: None If you have labs (blood work) drawn today and your tests are completely normal, you will receive your results only by: MyChart Message (if you have MyChart) OR A paper copy in the mail If you have any lab test that is abnormal or we need to change your treatment, we will call you to review the results.  Testing/Procedures: Your physician has requested that you have a carotid duplex. This test is an ultrasound of the carotid arteries in your neck. It looks at blood flow through these arteries that supply the brain with blood. Allow one hour for this exam. There are no restrictions or special instructions.   Follow-Up: At [facility], you and your health needs are our priority.  As part of our continuing mission to provide you with exceptional heart care, our providers are all part of one team.  This team includes your primary Cardiologist (physician) and Advanced Practice Providers or APPs (Physician Assistants and Nurse Practitioners) who all work together to provide you with the care you need, when you need it.  Your next appointment:   1 year(s)  Provider:   Ralene Burger, MD    We recommend signing up for the patient portal called "MyChart".  Sign up information is provided on this After Visit Summary.  MyChart is used to connect with patients for Virtual Visits (Telemedicine).  Patients are able to view lab/test results, encounter notes, upcoming appointments, etc.  Non-urgent messages can be sent to your provider as well.   To learn more about what you can do with MyChart, go to [email or web address].   Other Instructions None

## 2023-09-28 ENCOUNTER — Other Ambulatory Visit: Payer: Self-pay | Admitting: Cardiology

## 2023-10-02 ENCOUNTER — Encounter: Payer: Self-pay | Admitting: Family Medicine

## 2023-10-02 DIAGNOSIS — K219 Gastro-esophageal reflux disease without esophagitis: Secondary | ICD-10-CM

## 2023-10-03 MED ORDER — OMEPRAZOLE 20 MG PO CPDR: 20.0000 mg | DELAYED_RELEASE_CAPSULE | Freq: Every day | ORAL | 3 refills | Status: AC

## 2023-10-16 ENCOUNTER — Other Ambulatory Visit: Payer: Self-pay | Admitting: Cardiology

## 2023-10-16 DIAGNOSIS — I739 Peripheral vascular disease, unspecified: Secondary | ICD-10-CM

## 2023-10-16 DIAGNOSIS — R011 Cardiac murmur, unspecified: Secondary | ICD-10-CM

## 2023-10-16 DIAGNOSIS — Z951 Presence of aortocoronary bypass graft: Secondary | ICD-10-CM

## 2023-10-16 DIAGNOSIS — G458 Other transient cerebral ischemic attacks and related syndromes: Secondary | ICD-10-CM

## 2023-10-16 DIAGNOSIS — E785 Hyperlipidemia, unspecified: Secondary | ICD-10-CM

## 2023-10-29 ENCOUNTER — Ambulatory Visit (HOSPITAL_BASED_OUTPATIENT_CLINIC_OR_DEPARTMENT_OTHER)
Admission: RE | Admit: 2023-10-29 | Discharge: 2023-10-29 | Disposition: A | Discharge status: Home / Self Care | Source: Ambulatory Visit | Attending: Cardiology | Admitting: Cardiology

## 2023-10-29 ENCOUNTER — Ambulatory Visit: Payer: Self-pay | Admitting: Cardiology

## 2023-10-29 DIAGNOSIS — I739 Peripheral vascular disease, unspecified: Secondary | ICD-10-CM

## 2023-10-29 DIAGNOSIS — G458 Other transient cerebral ischemic attacks and related syndromes: Secondary | ICD-10-CM | POA: Diagnosis not present

## 2023-11-02 ENCOUNTER — Encounter: Payer: Self-pay | Admitting: Obstetrics & Gynecology

## 2023-11-13 ENCOUNTER — Encounter: Payer: Self-pay | Admitting: Cardiology

## 2023-11-14 ENCOUNTER — Telehealth: Payer: Self-pay

## 2023-11-14 NOTE — Telephone Encounter (Signed)
 Left message on My Chart with [place] results per Dr. Vanetta Shawl note. Routed to PCP.

## 2023-11-15 ENCOUNTER — Telehealth: Payer: Self-pay

## 2023-11-15 NOTE — Telephone Encounter (Signed)
 Pt viewed [place] results on My Chart per Dr. Vanetta Shawl note. Routed to PCP.

## 2023-11-18 ENCOUNTER — Other Ambulatory Visit: Payer: Self-pay | Admitting: Obstetrics & Gynecology

## 2023-11-18 DIAGNOSIS — N958 Other specified menopausal and perimenopausal disorders: Secondary | ICD-10-CM

## 2023-11-18 MED ORDER — ESTRADIOL 0.1 MG/GM VA CREA: 0.5000 | TOPICAL_CREAM | Freq: Every day | VAGINAL | 5 refills | Status: AC

## 2023-11-18 MED ORDER — CLOBETASOL PROPIONATE 0.05 % EX OINT: TOPICAL_OINTMENT | CUTANEOUS | 5 refills | Status: DC

## 2023-11-22 ENCOUNTER — Encounter: Payer: Self-pay | Admitting: Family Medicine

## 2023-11-22 ENCOUNTER — Other Ambulatory Visit: Payer: Self-pay | Admitting: Family Medicine

## 2023-11-22 DIAGNOSIS — B009 Herpesviral infection, unspecified: Secondary | ICD-10-CM

## 2023-11-22 MED ORDER — ACYCLOVIR 400 MG PO TABS: 400.0000 mg | ORAL_TABLET | Freq: Two times a day (BID) | ORAL | 3 refills | Status: DC

## 2023-12-12 ENCOUNTER — Ambulatory Visit (INDEPENDENT_AMBULATORY_CARE_PROVIDER_SITE_OTHER)

## 2023-12-12 VITALS — BP 106/66 | Ht 59.0 in | Wt 166.0 lb

## 2023-12-12 DIAGNOSIS — Z0000 Encounter for general adult medical examination without abnormal findings: Secondary | ICD-10-CM | POA: Diagnosis not present

## 2023-12-12 NOTE — Progress Notes (Signed)
 Because this visit was a virtual/telehealth visit,  certain criteria was not obtained, such a blood pressure, CBG if applicable, and timed get up and go. Any medications not marked as taking were not mentioned during the medication reconciliation part of the visit. Any vitals not documented were not able to be obtained due to this being a telehealth visit or patient was unable to self-report a recent blood pressure reading due to a lack of equipment at home via telehealth. Vitals that have been documented are verbally provided by the patient.   This visit was performed by a medical professional under my direct supervision. I was immediately available for consultation/collaboration. I have reviewed and agree with the Annual Wellness Visit documentation.  Subjective:   [name] is a 73 y.o. who presents for a Medicare Wellness preventive visit.  As a reminder, Annual Wellness Visits don't include a physical exam, and some assessments may be limited, especially if this visit is performed virtually. We may recommend an in-person follow-up visit with your provider if needed.  Visit Complete: Virtual I connected with  [name] on 12/12/23 by a audio enabled telemedicine application and verified that I am speaking with the correct person using two identifiers.  Patient Location: Home  Provider Location: Home Office  I discussed the limitations of evaluation and management by telemedicine. The patient expressed understanding and agreed to proceed.  Vital Signs: Because this visit was a virtual/telehealth visit, some criteria may be missing or patient reported. Any vitals not documented were not able to be obtained and vitals that have been documented are patient reported.  VideoDeclined- This patient declined [name], academic. Therefore the visit was completed with audio only.  Persons Participating in Visit: Patient.  AWV Questionnaire: Yes: Patient  Medicare AWV questionnaire was completed by the patient on 07/23/205=25; I have confirmed that all information answered by patient is correct and no changes since this date.  Cardiac Risk Factors include: advanced age (>30men, >92 women);dyslipidemia     Objective:    Today's Vitals   12/12/23 0928  BP: 106/66  Weight: 166 lb (75.3 kg)  Height: 4' 11 (1.499 m)   Body mass index is 33.53 kg/m.     12/12/2023    9:27 AM 03/19/2023    7:54 AM 10/26/2022   10:21 AM 09/21/2021    4:25 PM 09/08/2021   11:35 AM 09/09/2020   12:42 PM 09/04/2019    8:53 AM  Advanced Directives  Does Patient Have a Medical Advance Directive? Yes Yes Yes Yes Yes Yes Yes  Type of Estate agent of [place];Living will Healthcare Power of [employer] of [place];Living will Healthcare Power of [place];Living will Healthcare Power of [place];Living will Healthcare Power of [place];Living will Living will;Healthcare Power of Attorney  Does patient want to make changes to medical advance directive? No - Patient declined  No - Patient declined No - Patient declined   No - Patient declined  Copy of Healthcare Power of Attorney in Chart? Yes - validated most recent copy scanned in chart (See row information) Yes - validated most recent copy scanned in chart (See row information) Yes - validated most recent copy scanned in chart (See row information) Yes - validated most recent copy scanned in chart (See row information) No - copy requested No - copy requested No - copy requested    Current Medications (verified) Outpatient Encounter Medications as of 12/12/2023  Medication Sig   acetaminophen  (TYLENOL ) 500 MG tablet Take  500 mg by mouth every 6 (six) hours as needed for mild pain or moderate pain.   acyclovir  (ZOVIRAX ) 400 MG tablet Take 1 tablet (400 mg total) by mouth 2 (two) times daily.   Ascorbic Acid (VITAMIN C WITH ROSE HIPS) 500 MG tablet Take 500 mg by mouth daily.   aspirin   EC 81 MG EC tablet Take 1 tablet (81 mg total) by mouth daily.   atorvastatin  (LIPITOR ) 80 MG tablet Take 1 tablet (80 mg total) by mouth daily.   cholecalciferol (VITAMIN D3) 25 MCG (1000 UNIT) tablet Take 2,000 Units by mouth daily.   estradiol  (ESTRACE  VAGINAL) 0.1 MG/GM vaginal cream Place 0.5 Applicatorfuls vaginally daily.   ezetimibe  (ZETIA ) 10 MG tablet Take 1 tablet (10 mg total) by mouth daily. (Patient taking differently: Take 10 mg by mouth every evening.)   levothyroxine  (SYNTHROID ) 88 MCG tablet Take 1 tablet (88 mcg total) by mouth daily before breakfast.   metoprolol  tartrate (LOPRESSOR ) 25 MG tablet Take 0.5 tablets (12.5 mg total) by mouth 2 (two) times daily.   Multiple Vitamin (MULTIVITAMIN) tablet Take 1 tablet by mouth every evening.   omeprazole  (PRILOSEC) 20 MG capsule Take 1 capsule (20 mg total) by mouth daily.   Zinc Sulfate (ZINC 15 PO) Take 1 tablet by mouth daily.   clobetasol  ointment (TEMOVATE ) 0.05 % Apply to affected area every night for 4 weeks, then every other day for 4 weeks and then twice a week for 4 weeks or until resolution.   Magnesium  100 MG CAPS Take 1 capsule by mouth every other day.   No facility-administered encounter medications on file as of 12/12/2023.    Allergies (verified) Other, Contrast media [iodinated contrast media], Povidone-iodine, and Scopolamine   History: Past Medical History:  Diagnosis Date   Allergy 1979   Arthritis    Bilateral carotid bruits    1-39% bilateral ICA stenosis   Blood transfusion without reported diagnosis    Branch retinal vein occlusion of left eye with macular edema 01/09/2018   Cataract 2016   Bilateral cataract surgery 2017   Chicken pox    Closed fracture dislocation of lunate bone of right wrist    1970's   Closed fracture of left wrist 12/13/2018   Formatting of this note might be different from the original. Added automatically from request for surgery [id number]   Diverticulosis of colon  05/16/2015   Dyslipidemia    Genital herpes    GERD (gastroesophageal reflux disease)    Hepatitis B    HSV-2 (herpes simplex virus 2) infection 06/11/2016   Hyperlipidemia 1974   On meds   Hypothyroidism (acquired) 06/11/2016   Kidney stone    NSTEMI (non-ST elevated myocardial infarction) (HCC) 04/15/2018   Troponin 0.58 on 04/15/18   Osteoarthritis 06/11/2016   Osteopenia 11/30/2017   Pancreatitis    yrs ago   Peripheral vascular disease, unspecified (HCC) right subclavian artery stenosis 04/18/2019   PONV (postoperative nausea and vomiting)    Prediabetes 03/11/2020   Pseudophakia of both eyes 02/18/2018   PVD (posterior vitreous detachment), bilateral 02/18/2018   S/P CABG x 4 04/15/18 04/17/2018   LIMA to LAD SVG to DIAGONAL SVG to OM SVG to RCA   Substance abuse (HCC) 1964   Clean and sober since 07/29/1989   Systolic murmur    Past Surgical History:  Procedure Laterality Date   APPENDECTOMY  1972   clavicle repair 1970's right     COLPOCLEISIS N/A 03/19/2023   Procedure: Ladora Alexanders  COLPOCLEISIS;  Surgeon: Marilynne Rosaline SAILOR, MD;  Location: [facility];  Service: Gynecology;  Laterality: N/A;   CORONARY ARTERY BYPASS GRAFT N/A 04/17/2018   Procedure: CORONARY ARTERY BYPASS GRAFTING (CABG) TIMES USING LEFT INTERNAL MAMMARY ARTERY TO LAD AND SAPHENOUS VEIN TO DIAG 1, OM1, AND RCA;  Surgeon: Army Dallas NOVAK, MD;  Location: [facility] OR;  Service: Open Heart Surgery;  Laterality: N/A;   CYSTOSCOPY N/A 03/19/2023   Procedure: CYSTOSCOPY;  Surgeon: Marilynne Rosaline SAILOR, MD;  Location: [facility];  Service: Gynecology;  Laterality: N/A;   ENDOVEIN HARVEST OF GREATER SAPHENOUS VEIN Right 04/17/2018   Procedure: ENDOVEIN HARVEST OF GREATER SAPHENOUS VEIN RIGHT THIGH AND CALF;  Surgeon: Army Dallas NOVAK, MD;  Location: [facility] OR;  Service: Open Heart Surgery;  Laterality: Right;   EYE SURGERY  11/2015   Bilateral cataract surgery   FRACTURE SURGERY  R  clavicle fx 1989?   Repair failed   LAPAROSCOPY     LEFT HEART CATH AND CORONARY ANGIOGRAPHY N/A 04/16/2018   Procedure: LEFT HEART CATH AND CORONARY ANGIOGRAPHY;  Surgeon: Verlin Lonni BIRCH, MD;  Location: [facility] INVASIVE CV LAB;  Service: Cardiovascular;  Laterality: N/A;   OVARIAN CYST SURGERY     RECTOCELE REPAIR N/A 03/19/2023   Procedure: LEVATOR PLICATION and PERINEORRHAPHY;  Surgeon: Marilynne Rosaline SAILOR, MD;  Location[facility];  Service: Gynecology;  Laterality: N/A;   TEE WITHOUT CARDIOVERSION N/A 04/17/2018   Procedure: TRANSESOPHAGEAL ECHOCARDIOGRAM (TEE);  Surgeon: Army Dallas NOVAK, MD;  Location: [facility] OR;  Service: Open Heart Surgery;  Laterality: N/A;   TONSILLECTOMY     WRIST SURGERY Left 11/2018   Plate placement.   Family History  Problem Relation Age of Onset   Alcoholism Mother    Kidney disease Mother    Kidney Stones Mother    Liver cancer Mother    Cancer Mother    Vision loss Mother    Alcoholism Father    Hyperlipidemia Father    Heart disease Father    Heart failure Father    Alcohol abuse Father    Arthritis Father    Alcoholism Maternal Grandfather    Hearing loss Brother    Heart disease Brother    Hyperlipidemia Brother    Hypertension Brother    Social History   Socioeconomic History   Marital status: Divorced    Spouse name: Not on file   Number of children: Not on file   Years of education: Not on file   Highest education level: Some college, no degree  Occupational History   Not on file  Tobacco Use   Smoking status: Former    Current packs/day: 0.00    Average packs/day: 3.0 packs/day for 28.0 years (84.0 ttl pk-yrs)    Types: Cigarettes    Start date: 04/22/1963    Quit date: 04/22/1991    Years since quitting: 32.6   Smokeless tobacco: Never  Vaping Use   Vaping status: Never Used  Substance and Sexual Activity   Alcohol use: Not Currently    Comment: Last drink 07/1989   Drug use: Not Currently     Types: Amphetamines, Barbituates, Benzodiazepines, Crack cocaine, Cocaine, Heroin, LSD, Methaqualone, Psilocybin    Comment: clean and sober  since 1991   Sexual activity: Not Currently    Birth control/protection: Post-menopausal  Other Topics Concern   Not on file  Social History Narrative   Not on file   Social Drivers of Health  Financial Resource Strain: Low Risk  (12/05/2023)   Overall Financial Resource Strain (CARDIA)    Difficulty of Paying Living Expenses: Not hard at all  Food Insecurity: No Food Insecurity (12/05/2023)   Hunger Vital Sign    Worried About Running Out of Food in the Last Year: Never true    Ran Out of Food in the Last Year: Never true  Transportation Needs: No Transportation Needs (12/05/2023)   PRAPARE - Administrator, Civil Service (Medical): No    Lack of Transportation (Non-Medical): No  Physical Activity: Sufficiently Active (12/05/2023)   Exercise Vital Sign    Days of Exercise per Week: 4 days    Minutes of Exercise per Session: 60 min  Stress: No Stress Concern Present (12/05/2023)   [employer] of Occupational Health - Occupational Stress Questionnaire    Feeling of Stress: Not at all  Social Connections: Moderately Isolated (12/05/2023)   Social Connection and Isolation Panel    Frequency of Communication with Friends and Family: More than three times a week    Frequency of Social Gatherings with Friends and Family: More than three times a week    Attends Religious Services: More than 4 times per year    Active Member of [employer] or Organizations: No    Attends Engineer, structural: Never    Marital Status: Divorced    Tobacco Counseling Counseling given: Not Answered    Clinical Intake:  Pre-visit preparation completed: Yes  Pain : No/denies pain     BMI - recorded: 33.53 Nutritional Status: BMI > 30  Obese Nutritional Risks: None Diabetes: No  Lab Results  Component Value Date   HGBA1C 6.0  08/16/2023   HGBA1C 5.7 08/28/2022   HGBA1C 6.0 12/28/2021     How often do you need to have someone help you when you read instructions, pamphlets, or other written materials from your doctor or pharmacy?: 1 - Never  Interpreter Needed?: No  Information entered by :: Canon Gola,CMA   Activities of Daily Living     12/05/2023    9:15 AM 03/19/2023    8:03 AM  In your present state of health, do you have any difficulty performing the following activities:  Hearing? 0 0  Vision? 0 0  Difficulty concentrating or making decisions? 0 0  Walking or climbing stairs? 0   Dressing or bathing? 0   Doing errands, shopping? 0   Preparing Food and eating ? N   Using the Toilet? N   In the past six months, have you accidently leaked urine? Y   Do you have problems with loss of bowel control? Y   Managing your Medications? N   Managing your Finances? N   Housekeeping or managing your Housekeeping? N     Patient Care Team: Copland, Harlene BROCKS, MD as PCP - General (Family Medicine) Raford Riggs, MD as PCP - Cardiology (Cardiology)  I have updated your Care Teams any recent Medical Services you may have received from other providers in the past year.     Assessment:   This is a routine wellness examination for Munday.  Hearing/Vision screen Hearing Screening - Comments:: No hearing difficulties Vision Screening - Comments:: Patient wears glasses   Goals Addressed             This Visit's Progress    Maintain healthy active lifestyle.   On track    Patient Stated   On track    Increase activity  Depression Screen     12/12/2023    9:31 AM 10/26/2022   10:28 AM 08/28/2022    9:16 AM 09/08/2021   11:32 AM 09/09/2020   12:47 PM 09/04/2019    8:59 AM  PHQ 2/9 Scores  PHQ - 2 Score 0 0 0 0 0 0  PHQ- 9 Score 0         Fall Risk     12/05/2023    9:15 AM 10/19/2022    6:28 PM 08/28/2022    9:16 AM 09/08/2021   11:35 AM 09/09/2020   12:46 PM  Fall Risk    Falls in the past year? 0 0 0 0 0  Number falls in past yr: 0 0 0 0 0  Injury with Fall? 0 0 0 0 0  Risk for fall due to : No Fall Risks No Fall Risks No Fall Risks No Fall Risks   Follow up Falls evaluation completed Falls evaluation completed Falls evaluation completed Falls prevention discussed  Falls prevention discussed      Data saved with a previous flowsheet row definition    MEDICARE RISK AT HOME:  Medicare Risk at Home Any stairs in or around the home?: (Patient-Rptd) Yes If so, are there any without handrails?: (Patient-Rptd) No Home free of loose throw rugs in walkways, pet beds, electrical cords, etc?: (Patient-Rptd) Yes Adequate lighting in your home to reduce risk of falls?: (Patient-Rptd) Yes Life alert?: (Patient-Rptd) No Use of a cane, walker or w/c?: (Patient-Rptd) No Grab bars in the bathroom?: (Patient-Rptd) No Shower chair or bench in shower?: (Patient-Rptd) No Elevated toilet seat or a handicapped toilet?: (Patient-Rptd) No  TIMED UP AND GO:  Was the test performed?  No  Cognitive Function: 6CIT completed        12/12/2023    9:32 AM 10/26/2022   10:39 AM 09/08/2021   11:42 AM 09/09/2020   12:56 PM  6CIT Screen  What Year? 0 points 0 points 0 points 0 points  What month? 0 points 0 points 0 points 0 points  What time? 0 points 0 points 0 points 0 points  Count back from 20 0 points 0 points 0 points 0 points  Months in reverse 0 points 0 points 0 points 0 points  Repeat phrase 0 points 0 points 0 points 0 points  Total Score 0 points 0 points 0 points 0 points    Immunizations Immunization History  Administered Date(s) Administered   Fluad  Quad(high Dose 65+) 01/09/2019, 02/01/2021, 02/02/2022   Fluad  Trivalent(High Dose 65+) 02/05/2023   Influenza-Unspecified 01/19/2017, 01/24/2018, 02/06/2020   PFIZER Comirnaty (Gray Top)Covid-19 Tri-Sucrose Vaccine 08/20/2020   PFIZER(Purple Top)SARS-COV-2 Vaccination 06/20/2019, 07/11/2019, 02/17/2020    Pfizer Covid-19 Vaccine Bivalent Booster 75yrs & up 03/04/2021   Pfizer(Comirnaty )Fall Seasonal Vaccine 12 years and older 03/06/2022, 02/05/2023   Pneumococcal Conjugate-13 05/15/2016   Pneumococcal Polysaccharide-23 05/15/2014   Tdap 05/15/2013    Screening Tests Health Maintenance  Topic Date Due   DTaP/Tdap/Td (2 - Td or Tdap) 05/16/2023   COVID-19 Vaccine (8 - 2024-25 season) 08/05/2023   INFLUENZA VACCINE  12/14/2023   Fecal DNA (Cologuard)  07/23/2024   Medicare Annual Wellness (AWV)  12/11/2024   Pneumococcal Vaccine: 50+ Years  Completed   DEXA SCAN  Completed   Hepatitis C Screening  Completed   Hepatitis B Vaccines  Aged Out   HPV VACCINES  Aged Out   Meningococcal B Vaccine  Aged Out   Zoster Vaccines- Shingrix  Discontinued  Health Maintenance  Health Maintenance Due  Topic Date Due   DTaP/Tdap/Td (2 - Td or Tdap) 05/16/2023   COVID-19 Vaccine (8 - 2024-25 season) 08/05/2023   Health Maintenance Items Addressed:patient declined vaccination  Additional Screening:  Vision Screening: Recommended annual ophthalmology exams for early detection of glaucoma and other disorders of the eye. Would you like a referral to an eye doctor? No    Dental Screening: Recommended annual dental exams for proper oral hygiene  Community Resource Referral / Chronic Care Management: CRR required this visit?  No   CCM required this visit?  No   Plan:    I have personally reviewed and noted the following in the patient's chart:   Medical and social history Use of alcohol, tobacco or illicit drugs  Current medications and supplements including opioid prescriptions. Patient is not currently taking opioid prescriptions. Functional ability and status Nutritional status Physical activity Advanced directives List of other physicians Hospitalizations, surgeries, and ER visits in previous 12 months Vitals Screenings to include cognitive, depression, and falls Referrals and  appointments  In addition, I have reviewed and discussed with patient certain preventive protocols, quality metrics, and best practice recommendations. A written personalized care plan for preventive services as well as general preventive health recommendations were provided to patient.   Lyle MARLA Right, [place]   12/12/2023   After Visit Summary: (MyChart) Due to this being a telephonic visit, the after visit summary with patients personalized plan was offered to patient via MyChart   Notes: Nothing significant to report at this time.

## 2023-12-12 NOTE — Patient Instructions (Addendum)
 [name] , Thank you for taking time out of your busy schedule to complete your Annual Wellness Visit with me. I enjoyed our conversation and look forward to speaking with you again next year. I, as well as your care team,  appreciate your ongoing commitment to your health goals. Please review the following plan we discussed and let me know if I can assist you in the future. Your Game plan/ To Do List    Referrals: If you haven't heard from the office you've been referred to, please reach out to them at the phone provided.  None  Follow up Visits: Next Medicare AWV with our clinical staff: patient declined   Have you seen your provider in the last 6 months (3 months if uncontrolled diabetes)? Yes Next Office Visit with your provider: n/a  Clinician Recommendations:  Aim for 30 minutes of exercise or brisk walking, 6-8 glasses of water , and 5 servings of fruits and vegetables each day.       This is a list of the screening recommended for you and due dates:  Health Maintenance  Topic Date Due   DTaP/Tdap/Td vaccine (2 - Td or Tdap) 05/16/2023   COVID-19 Vaccine (8 - 2024-25 season) 08/05/2023   Flu Shot  12/14/2023   Cologuard (Stool DNA test)  07/23/2024   Medicare Annual Wellness Visit  12/11/2024   Pneumococcal Vaccine for age over 77  Completed   DEXA scan (bone density measurement)  Completed   Hepatitis C Screening  Completed   Hepatitis B Vaccine  Aged Out   HPV Vaccine  Aged Out   Meningitis B Vaccine  Aged Out   Zoster (Shingles) Vaccine  Discontinued    Advanced directives: (In Chart) A copy of your advanced directives are scanned into your chart should your provider ever need it. Advance Care Planning is important because it:  [x]  Makes sure you receive the medical care that is consistent with your values, goals, and preferences  [x]  It provides guidance to your family and loved ones and reduces their decisional burden about whether or not they are making the right  decisions based on your wishes.  Follow the link provided in your after visit summary or read over the paperwork we have mailed to you to help you started getting your Advance Directives in place. If you need assistance in completing these, please reach out to us  so that we can help you!  See attachments for Preventive Care and Fall Prevention Tips.

## 2024-01-18 ENCOUNTER — Other Ambulatory Visit: Payer: Self-pay | Admitting: Cardiology

## 2024-01-21 ENCOUNTER — Encounter: Payer: Self-pay | Admitting: Family Medicine

## 2024-01-22 ENCOUNTER — Ambulatory Visit: Admission: EM | Admit: 2024-01-22 | Discharge: 2024-01-22 | Discharge status: Against Medical Advice

## 2024-01-22 ENCOUNTER — Encounter: Payer: Self-pay | Admitting: Family Medicine

## 2024-01-22 ENCOUNTER — Ambulatory Visit

## 2024-01-23 ENCOUNTER — Ambulatory Visit
Admission: RE | Admit: 2024-01-23 | Discharge: 2024-01-23 | Disposition: A | Discharge status: Home / Self Care | Payer: Self-pay

## 2024-01-23 VITALS — BP 102/71 | HR 63 | Temp 97.8°F | Resp 17

## 2024-01-23 DIAGNOSIS — R319 Hematuria, unspecified: Secondary | ICD-10-CM | POA: Diagnosis not present

## 2024-01-23 LAB — POCT URINALYSIS DIP (MANUAL ENTRY)
Bilirubin, UA: NEGATIVE
Glucose, UA: NEGATIVE mg/dL
Ketones, POC UA: NEGATIVE mg/dL
Leukocytes, UA: NEGATIVE
Nitrite, UA: NEGATIVE
Protein Ur, POC: NEGATIVE mg/dL
Spec Grav, UA: 1.01 (ref 1.010–1.025)
Urobilinogen, UA: 0.2 U/dL
pH, UA: 7 (ref 5.0–8.0)

## 2024-01-23 NOTE — ED Provider Notes (Signed)
 [facility]    CSN: [id number] Arrival date & time: 01/23/24  0906      History   Chief Complaint Chief Complaint  Patient presents with   Hematuria    HPI [name] is a 73 y.o. female.   HPI very pleasant 73 year old female presents with hematuria since Sunday, 01/20/2024.  PMH significant for CAD (s/p non-STEMI and CABG x 4), dyslipidemia, and kidney stone  Past Medical History:  Diagnosis Date   Allergy 1979   Arthritis    Bilateral carotid bruits    1-39% bilateral ICA stenosis   Blood transfusion without reported diagnosis    Branch retinal vein occlusion of left eye with macular edema 01/09/2018   Cataract 2016   Bilateral cataract surgery 2017   Chicken pox    Closed fracture dislocation of lunate bone of right wrist    1970's   Closed fracture of left wrist 12/13/2018   Formatting of this note might be different from the original. Added automatically from request for surgery [id number]   Diverticulosis of colon 05/16/2015   Dyslipidemia    Genital herpes    GERD (gastroesophageal reflux disease)    Hepatitis B    HSV-2 (herpes simplex virus 2) infection 06/11/2016   Hyperlipidemia 1974   On meds   Hypothyroidism (acquired) 06/11/2016   Kidney stone    NSTEMI (non-ST elevated myocardial infarction) (HCC) 04/15/2018   Troponin 0.58 on 04/15/18   Osteoarthritis 06/11/2016   Osteopenia 11/30/2017   Pancreatitis    yrs ago   Peripheral vascular disease, unspecified (HCC) right subclavian artery stenosis 04/18/2019   PONV (postoperative nausea and vomiting)    Prediabetes 03/11/2020   Pseudophakia of both eyes 02/18/2018   PVD (posterior vitreous detachment), bilateral 02/18/2018   S/P CABG x 4 04/15/18 04/17/2018   LIMA to LAD SVG to DIAGONAL SVG to OM SVG to RCA   Substance abuse (HCC) 1964   Clean and sober since 07/29/1989   Systolic murmur     Patient Active Problem List   Diagnosis Date Noted   Steal syndrome, subclavian on the  right 09/12/2022   Dermatochalasis of both upper eyelids 07/27/2022   Cystoid macular edema of left eye 07/27/2022   Astigmatism with presbyopia, bilateral 07/27/2022   Arcus senilis of both eyes 07/27/2022   Poor dentition 12/28/2021   Arthritis    Chicken pox    Genital herpes    Hepatitis B    Hypothyroidism    Kidney stone    Prediabetes 03/11/2020   Peripheral vascular disease, unspecified (HCC) right subclavian artery stenosis 04/18/2019   Closed fracture of left wrist 12/13/2018   S/P CABG x 4 04/15/18 04/17/2018   NSTEMI (non-ST elevated myocardial infarction) (HCC) 04/15/2018   Bilateral carotid bruits    Systolic murmur    Pseudophakia of both eyes 02/18/2018   PVD (posterior vitreous detachment), bilateral 02/18/2018   Branch retinal vein occlusion of left eye with macular edema 01/09/2018   Osteopenia 11/30/2017   GERD (gastroesophageal reflux disease) 06/11/2016   Osteoarthritis 06/11/2016   Hypothyroidism (acquired) 06/11/2016   Dyslipidemia 06/11/2016   HSV-2 (herpes simplex virus 2) infection 06/11/2016   Diverticulosis of colon 05/16/2015    Past Surgical History:  Procedure Laterality Date   APPENDECTOMY  1972   clavicle repair 1970's right     COLPOCLEISIS N/A 03/19/2023   Procedure: Ladora Alexanders COLPOCLEISIS;  Surgeon: Marilynne Rosaline SAILOR, MD;  Location: [facility];  Service: Gynecology;  Laterality: N/A;  CORONARY ARTERY BYPASS GRAFT N/A 04/17/2018   Procedure: CORONARY ARTERY BYPASS GRAFTING (CABG) TIMES USING LEFT INTERNAL MAMMARY ARTERY TO LAD AND SAPHENOUS VEIN TO DIAG 1, OM1, AND RCA;  Surgeon: Army Dallas NOVAK, MD;  Location: [facility] OR;  Service: Open Heart Surgery;  Laterality: N/A;   CYSTOSCOPY N/A 03/19/2023   Procedure: CYSTOSCOPY;  Surgeon: Marilynne Rosaline SAILOR, MD;  Location: [facility];  Service: Gynecology;  Laterality: N/A;   ENDOVEIN HARVEST OF GREATER SAPHENOUS VEIN Right 04/17/2018   Procedure: ENDOVEIN  HARVEST OF GREATER SAPHENOUS VEIN RIGHT THIGH AND CALF;  Surgeon: Army Dallas NOVAK, MD;  Location: [facility] OR;  Service: Open Heart Surgery;  Laterality: Right;   EYE SURGERY  11/2015   Bilateral cataract surgery   FRACTURE SURGERY  R clavicle fx 1989?   Repair failed   LAPAROSCOPY     LEFT HEART CATH AND CORONARY ANGIOGRAPHY N/A 04/16/2018   Procedure: LEFT HEART CATH AND CORONARY ANGIOGRAPHY;  Surgeon: Verlin Lonni BIRCH, MD;  Location: [facility];  Service: Cardiovascular;  Laterality: N/A;   OVARIAN CYST SURGERY     RECTOCELE REPAIR N/A 03/19/2023   Procedure: LEVATOR PLICATION and PERINEORRHAPHY;  Surgeon: Marilynne Rosaline SAILOR, MD;  Location: [facility];  Service: Gynecology;  Laterality: N/A;   TEE WITHOUT CARDIOVERSION N/A 04/17/2018   Procedure: TRANSESOPHAGEAL ECHOCARDIOGRAM (TEE);  Surgeon: Army Dallas NOVAK, MD;  Location: [facility] OR;  Service: Open Heart Surgery;  Laterality: N/A;   TONSILLECTOMY     WRIST SURGERY Left 11/2018   Plate placement.    OB History     Gravida  2   Para  1   Term  1   Preterm  0   AB  1   Living  1      SAB  0   IAB  1   Ectopic  0   Multiple  0   Live Births  1            Home Medications    Prior to Admission medications   Medication Sig Start Date End Date Taking? Authorizing Provider  loratadine (CLARITIN) 10 MG tablet Take 10 mg by mouth daily.   Yes [provider]  acetaminophen  (TYLENOL ) 500 MG tablet Take 500 mg by mouth every 6 (six) hours as needed for mild pain or moderate pain.    [provider]  acyclovir  (ZOVIRAX ) 400 MG tablet Take 1 tablet (400 mg total) by mouth 2 (two) times daily. 11/22/23   Copland, Jessica C, MD  Ascorbic Acid (VITAMIN C WITH ROSE HIPS) 500 MG tablet Take 500 mg by mouth daily.    [provider]  aspirin  EC 81 MG EC tablet Take 1 tablet (81 mg total) by mouth daily. 09/22/19   Krasowski, Robert J, MD  atorvastatin  (LIPITOR ) 80 MG  tablet Take 1 tablet (80 mg total) by mouth daily. 07/30/23   Krasowski, Robert J, MD  cholecalciferol (VITAMIN D3) 25 MCG (1000 UNIT) tablet Take 2,000 Units by mouth daily.    [provider]  clobetasol  ointment (TEMOVATE ) 0.05 % Apply to affected area every night for 4 weeks, then every other day for 4 weeks and then twice a week for 4 weeks or until resolution. 11/18/23   Corene Coy, MD  estradiol  (ESTRACE  VAGINAL) 0.1 MG/GM vaginal cream Place 0.5 Applicatorfuls vaginally daily. 11/18/23   Corene Coy, MD  ezetimibe  (ZETIA ) 10 MG tablet TAKE 1 TABLET BY MOUTH DAILY 01/18/24  Krasowski, Robert J, MD  levothyroxine  (SYNTHROID ) 88 MCG tablet Take 1 tablet (88 mcg total) by mouth daily before breakfast. 06/01/23   Copland, Jessica C, MD  Magnesium  100 MG CAPS Take 1 capsule by mouth every other day. 06/16/23   [provider]  metoprolol  tartrate (LOPRESSOR ) 25 MG tablet Take 0.5 tablets (12.5 mg total) by mouth 2 (two) times daily. 09/28/23   Krasowski, Robert J, MD  Multiple Vitamin (MULTIVITAMIN) tablet Take 1 tablet by mouth every evening.    [provider]  omeprazole  (PRILOSEC) 20 MG capsule Take 1 capsule (20 mg total) by mouth daily. 10/03/23   Copland, Jessica C, MD  Zinc Sulfate (ZINC 15 PO) Take 1 tablet by mouth daily. 03/16/23   [provider]    Family History Family History  Problem Relation Age of Onset   Alcoholism Mother    Kidney disease Mother    Kidney Stones Mother    Liver cancer Mother    Cancer Mother    Vision loss Mother    Alcoholism Father    Hyperlipidemia Father    Heart disease Father    Heart failure Father    Alcohol abuse Father    Arthritis Father    Alcoholism Maternal Grandfather    Hearing loss Brother    Heart disease Brother    Hyperlipidemia Brother    Hypertension Brother     Social History Social History   Tobacco Use   Smoking status: Former    Current packs/day: 0.00    Average  packs/day: 3.0 packs/day for 28.0 years (84.0 ttl pk-yrs)    Types: Cigarettes    Start date: 04/22/1963    Quit date: 04/22/1991    Years since quitting: 32.7   Smokeless tobacco: Never  Vaping Use   Vaping status: Never Used  Substance Use Topics   Alcohol use: Not Currently    Comment: Last drink 07/1989   Drug use: Not Currently    Types: Amphetamines, Barbituates, Benzodiazepines, Crack cocaine, Cocaine, Heroin, LSD, Methaqualone, Psilocybin    Comment: clean and sober  since 1991     Allergies   Other, Contrast media [iodinated contrast media], Povidone-iodine, and Scopolamine   Review of Systems Review of Systems  Genitourinary:  Positive for hematuria.     Physical Exam Triage Vital Signs ED Triage Vitals  Encounter Vitals Group     BP      Girls Systolic BP Percentile      Girls Diastolic BP Percentile      Boys Systolic BP Percentile      Boys Diastolic BP Percentile      Pulse      Resp      Temp      Temp src      SpO2      Weight      Height      Head Circumference      Peak Flow      Pain Score      Pain Loc      Pain Education      Exclude from Growth Chart    No data found.  Updated Vital Signs BP 102/71 (BP Location: Right Arm)   Pulse 63   Temp 97.8 F (36.6 C) (Oral)   Resp 17   SpO2 97%    Physical Exam Vitals and nursing note reviewed.  Constitutional:      Appearance: Normal appearance. She is normal weight.  HENT:  Head: Normocephalic and atraumatic.     Right Ear: Tympanic membrane, ear canal and external ear normal.     Left Ear: Tympanic membrane and ear canal normal.     Nose: Nose normal.     Mouth/Throat:     Mouth: Mucous membranes are moist.     Pharynx: Oropharynx is clear.  Eyes:     Extraocular Movements: Extraocular movements intact.     Conjunctiva/sclera: Conjunctivae normal.     Pupils: Pupils are equal, round, and reactive to light.  Cardiovascular:     Rate and Rhythm: Normal rate and regular  rhythm.     Pulses: Normal pulses.     Heart sounds: Normal heart sounds.  Pulmonary:     Effort: Pulmonary effort is normal.     Breath sounds: Normal breath sounds. No wheezing, rhonchi or rales.  Abdominal:     Tenderness: There is no right CVA tenderness or left CVA tenderness.  Musculoskeletal:        General: Normal range of motion.  Skin:    General: Skin is warm and dry.  Neurological:     General: No focal deficit present.     Mental Status: She is alert and oriented to person, place, and time. Mental status is at baseline.  Psychiatric:        Mood and Affect: Mood normal.        Behavior: Behavior normal.      UC Treatments / Results  Labs (all labs ordered are listed, but only abnormal results are displayed) Labs Reviewed  POCT URINALYSIS DIP (MANUAL ENTRY) - Abnormal; Notable for the following components:      Result Value   Blood, UA large (*)    All other components within normal limits    EKG   Radiology No results found.  Procedures Procedures (including critical care time)  Medications Ordered in UC Medications - No data to display  Initial Impression / Assessment and Plan / UC Course  I have reviewed the triage vital signs and the nursing notes.  Pertinent labs & imaging results that were available during my care of the patient were reviewed by me and considered in my medical decision making (see chart for details).     MDM: 1.  Hematuria, unspecified type-UA revealed above. Advised patient of large blood in urine today with no sign of acute cystitis/urinary tract infection.  Encouraged increase daily water  intake to 32 to 64 ounces per day 7 days/week.  Advised if symptoms worsen and/or unresolved please follow-up with your PCP or [facility] urology for further evaluation.  Patient discharged home, hemodynamically stable. Final Clinical Impressions(s) / UC Diagnoses   Final diagnoses:  Hematuria, unspecified type     Discharge  Instructions      Advised patient of large blood in urine today with no sign of acute cystitis/urinary tract infection.  Encouraged increase daily water  intake to 32 to 64 ounces per day 7 days/week.  Advised if symptoms worsen and/or unresolved please follow-up with your PCP or [facility] urology for further evaluation.     ED Prescriptions   None    PDMP not reviewed this encounter.   Teddy Sharper, FNP 01/23/24 [phone]

## 2024-01-23 NOTE — Discharge Instructions (Addendum)
 Advised patient of large blood in urine today with no sign of acute cystitis/urinary tract infection.  Encouraged increase daily water  intake to 32 to 64 ounces per day 7 days/week.  Advised if symptoms worsen and/or unresolved please follow-up with your PCP or [facility] urology for further evaluation.

## 2024-01-23 NOTE — ED Triage Notes (Signed)
 Pt c/o hematuria since Sunday.

## 2024-02-11 ENCOUNTER — Other Ambulatory Visit (HOSPITAL_BASED_OUTPATIENT_CLINIC_OR_DEPARTMENT_OTHER): Payer: Self-pay

## 2024-02-11 DIAGNOSIS — Z23 Encounter for immunization: Secondary | ICD-10-CM | POA: Diagnosis not present

## 2024-02-11 MED ORDER — FLUZONE HIGH-DOSE 0.5 ML IM SUSY
0.5000 mL | PREFILLED_SYRINGE | Freq: Once | INTRAMUSCULAR | 0 refills | Status: AC
  Filled 2024-02-11: qty 0.5, 1d supply, fill #0

## 2024-02-11 MED ORDER — BOOSTRIX 5-2.5-18.5 LF-MCG/0.5 IM SUSY
0.5000 mL | PREFILLED_SYRINGE | Freq: Once | INTRAMUSCULAR | 0 refills | Status: AC
  Filled 2024-02-11: qty 0.5, 1d supply, fill #0

## 2024-02-21 ENCOUNTER — Encounter: Payer: Self-pay | Admitting: Urology

## 2024-02-21 ENCOUNTER — Ambulatory Visit: Admitting: Urology

## 2024-02-21 VITALS — BP 112/77 | HR 63 | Ht 59.0 in | Wt 165.0 lb

## 2024-02-21 DIAGNOSIS — R310 Gross hematuria: Secondary | ICD-10-CM

## 2024-02-21 LAB — URINALYSIS, ROUTINE W REFLEX MICROSCOPIC
Bilirubin, UA: NEGATIVE
Glucose, UA: NEGATIVE
Ketones, UA: NEGATIVE
Leukocytes,UA: NEGATIVE
Nitrite, UA: NEGATIVE
Protein,UA: NEGATIVE
Specific Gravity, UA: <=1.005 — AB (ref 1.005–1.030)
Urobilinogen, Ur: 0.2 mg/dL (ref 0.2–1.0)
pH, UA: 6.5 (ref 5.0–7.5)

## 2024-02-21 LAB — MICROSCOPIC EXAMINATION: Bacteria, UA: NONE SEEN

## 2024-02-21 NOTE — Progress Notes (Signed)
 Assessment: 1. Gross hematuria     Plan: Today I had a discussion with the patient regarding the findings of gross hematuria including the implications and differential diagnoses associated with it.  I also discussed recommendations for further evaluation including the rationale for upper tract imaging and cystoscopy.  I discussed the nature of these procedures including potential risk and complications.  The patient expressed an understanding of these issues. Recommend CT renal stone study due to history of anaphylactic reaction to IV contrast. Cystoscopy after CT scan.  Chief Complaint:  Chief Complaint  Patient presents with   Hematuria    History of Present Illness:  [name] is a 72 y.o. female who is seen in consultation from Copland, Harlene BROCKS, MD for evaluation of gross hematuria. She had onset of brown-colored urine approximately 1 month ago.  Her symptoms lasted for 2 days.  No dysuria or flank pain. Urinalysis from 01/23/2024 showed large blood. The hematuria resolved.  No further episodes. She does have baseline symptoms of frequency, urgency, nocturia, urge incontinence. She has a history of UTIs with her last UTI approximately 2 years ago. She has a history of kidney stones with her last episode 20 years ago. She has a prior history of tobacco use smoking 2-3 packs/day for 25+ years.  She quit 23 years ago.  No recent imaging.   Past Medical History:  Past Medical History:  Diagnosis Date   Allergy 1979   Arthritis    Bilateral carotid bruits    1-39% bilateral ICA stenosis   Blood transfusion without reported diagnosis    Branch retinal vein occlusion of left eye with macular edema 01/09/2018   Cataract 2016   Bilateral cataract surgery 2017   Chicken pox    Closed fracture dislocation of lunate bone of right wrist    1970's   Closed fracture of left wrist 12/13/2018   Formatting of this note might be different from the original. Added automatically  from request for surgery [id number]   Diverticulosis of colon 05/16/2015   Dyslipidemia    Genital herpes    GERD (gastroesophageal reflux disease)    Hepatitis B    HSV-2 (herpes simplex virus 2) infection 06/11/2016   Hyperlipidemia 1974   On meds   Hypothyroidism (acquired) 06/11/2016   Kidney stone    NSTEMI (non-ST elevated myocardial infarction) (HCC) 04/15/2018   Troponin 0.58 on 04/15/18   Osteoarthritis 06/11/2016   Osteopenia 11/30/2017   Pancreatitis    yrs ago   Peripheral vascular disease, unspecified (HCC) right subclavian artery stenosis 04/18/2019   PONV (postoperative nausea and vomiting)    Prediabetes 03/11/2020   Pseudophakia of both eyes 02/18/2018   PVD (posterior vitreous detachment), bilateral 02/18/2018   S/P CABG x 4 04/15/18 04/17/2018   LIMA to LAD SVG to DIAGONAL SVG to OM SVG to RCA   Substance abuse (HCC) 1964   Clean and sober since 07/29/1989   Systolic murmur     Past Surgical History:  Past Surgical History:  Procedure Laterality Date   APPENDECTOMY  1972   clavicle repair 1970's right     COLPOCLEISIS N/A 03/19/2023   Procedure: Ladora Alexanders COLPOCLEISIS;  Surgeon: Marilynne Rosaline SAILOR, MD;  Location: [facility];  Service: Gynecology;  Laterality: N/A;   CORONARY ARTERY BYPASS GRAFT N/A 04/17/2018   Procedure: CORONARY ARTERY BYPASS GRAFTING (CABG) TIMES USING LEFT INTERNAL MAMMARY ARTERY TO LAD AND SAPHENOUS VEIN TO DIAG 1, OM1, AND RCA;  Surgeon: Army Loving  B, MD;  Location: [facility] OR;  Service: Open Heart Surgery;  Laterality: N/A;   CYSTOSCOPY N/A 03/19/2023   Procedure: CYSTOSCOPY;  Surgeon: Marilynne Rosaline SAILOR, MD;  Location: [facility];  Service: Gynecology;  Laterality: N/A;   ENDOVEIN HARVEST OF GREATER SAPHENOUS VEIN Right 04/17/2018   Procedure: ENDOVEIN HARVEST OF GREATER SAPHENOUS VEIN RIGHT THIGH AND CALF;  Surgeon: Army Dallas NOVAK, MD;  Location: [facility] OR;  Service: Open Heart Surgery;  Laterality:  Right;   EYE SURGERY  11/2015   Bilateral cataract surgery   FRACTURE SURGERY  R clavicle fx 1989?   Repair failed   LAPAROSCOPY     LEFT HEART CATH AND CORONARY ANGIOGRAPHY N/A 04/16/2018   Procedure: LEFT HEART CATH AND CORONARY ANGIOGRAPHY;  Surgeon: Verlin Lonni BIRCH, MD;  Location: [facility];  Service: Cardiovascular;  Laterality: N/A;   OVARIAN CYST SURGERY     RECTOCELE REPAIR N/A 03/19/2023   Procedure: LEVATOR PLICATION and PERINEORRHAPHY;  Surgeon: Marilynne Rosaline SAILOR, MD;  Location: [facility];  Service: Gynecology;  Laterality: N/A;   TEE WITHOUT CARDIOVERSION N/A 04/17/2018   Procedure: TRANSESOPHAGEAL ECHOCARDIOGRAM (TEE);  Surgeon: Army Dallas NOVAK, MD;  Location: [facility] OR;  Service: Open Heart Surgery;  Laterality: N/A;   TONSILLECTOMY     WRIST SURGERY Left 11/2018   Plate placement.    Allergies:  Allergies  Allergen Reactions   Other Anaphylaxis    CAT Scan dye allergy   Contrast Media [Iodinated Contrast Media]     Dye from salpingogram - caused scarring   Povidone-Iodine Swelling, Dermatitis and Rash    Rash and swelling with betadine   Scopolamine Anxiety and Other (See Comments)    And halluncinations    Family History:  Family History  Problem Relation Age of Onset   Alcoholism Mother    Kidney disease Mother    Kidney Stones Mother    Liver cancer Mother    Cancer Mother    Vision loss Mother    Alcoholism Father    Hyperlipidemia Father    Heart disease Father    Heart failure Father    Alcohol abuse Father    Arthritis Father    Alcoholism Maternal Grandfather    Hearing loss Brother    Heart disease Brother    Hyperlipidemia Brother    Hypertension Brother     Social History:  Social History   Tobacco Use   Smoking status: Former    Current packs/day: 0.00    Average packs/day: 3.0 packs/day for 28.0 years (84.0 ttl pk-yrs)    Types: Cigarettes    Start date: 04/22/1963    Quit date: 04/22/1991     Years since quitting: 32.8   Smokeless tobacco: Never  Vaping Use   Vaping status: Never Used  Substance Use Topics   Alcohol use: Not Currently    Comment: Last drink 07/1989   Drug use: Not Currently    Types: Amphetamines, Barbituates, Benzodiazepines, Crack cocaine, Cocaine, Heroin, LSD, Methaqualone, Psilocybin    Comment: clean and sober  since 1991    Review of symptoms:  Constitutional:  Negative for unexplained weight loss, night sweats, fever, chills ENT:  Negative for nose bleeds, sinus pain, painful swallowing CV:  Negative for chest pain, shortness of breath, exercise intolerance, palpitations, loss of consciousness Resp:  Negative for cough, wheezing, shortness of breath GI:  Negative for nausea, vomiting, diarrhea, bloody stools GU:  Positives noted in HPI; otherwise negative for dysuria  Neuro:  Negative for seizures, poor balance, limb weakness, slurred speech Psych:  Negative for lack of energy, depression, anxiety Endocrine:  Negative for polydipsia, polyuria, symptoms of hypoglycemia (dizziness, hunger, sweating) Hematologic:  Negative for anemia, purpura, petechia, prolonged or excessive bleeding, use of anticoagulants  Allergic:  Negative for difficulty breathing or choking as a result of exposure to anything; no shellfish allergy; no allergic response (rash/itch) to materials, foods  Physical exam: BP 112/77   Pulse 63   Ht 4' 11 (1.499 m)   Wt 165 lb (74.8 kg)   BMI 33.33 kg/m  GENERAL APPEARANCE:  Well appearing, well developed, well nourished, NAD HEENT: Atraumatic, Normocephalic, oropharynx clear. NECK: Supple without lymphadenopathy or thyromegaly. LUNGS: Clear to auscultation bilaterally. HEART: Regular Rate and Rhythm without murmurs, gallops, or rubs. ABDOMEN: Soft, non-tender, No Masses. EXTREMITIES: Moves all extremities well.  Without clubbing, cyanosis, or edema. NEUROLOGIC:  Alert and oriented x 3, normal gait, CN II-XII grossly intact.   MENTAL STATUS:  Appropriate. BACK:  Non-tender to palpation.  No CVAT SKIN:  Warm, dry and intact.    Results: U/A: 0-5 WBCs, 0-2 RBCs

## 2024-02-27 ENCOUNTER — Ambulatory Visit (HOSPITAL_BASED_OUTPATIENT_CLINIC_OR_DEPARTMENT_OTHER)
Admission: RE | Admit: 2024-02-27 | Discharge: 2024-02-27 | Disposition: A | Discharge status: Home / Self Care | Source: Ambulatory Visit | Attending: Urology | Admitting: Urology

## 2024-02-27 DIAGNOSIS — R160 Hepatomegaly, not elsewhere classified: Secondary | ICD-10-CM | POA: Diagnosis not present

## 2024-02-27 DIAGNOSIS — R310 Gross hematuria: Secondary | ICD-10-CM | POA: Diagnosis not present

## 2024-02-27 DIAGNOSIS — R932 Abnormal findings on diagnostic imaging of liver and biliary tract: Secondary | ICD-10-CM | POA: Diagnosis not present

## 2024-02-27 DIAGNOSIS — K5790 Diverticulosis of intestine, part unspecified, without perforation or abscess without bleeding: Secondary | ICD-10-CM | POA: Diagnosis not present

## 2024-02-27 DIAGNOSIS — N2889 Other specified disorders of kidney and ureter: Secondary | ICD-10-CM | POA: Diagnosis not present

## 2024-03-03 ENCOUNTER — Ambulatory Visit: Payer: Self-pay | Admitting: Urology

## 2024-03-13 ENCOUNTER — Encounter: Payer: Self-pay | Admitting: Family Medicine

## 2024-03-13 DIAGNOSIS — B009 Herpesviral infection, unspecified: Secondary | ICD-10-CM

## 2024-03-13 MED ORDER — ACYCLOVIR 400 MG PO TABS: 400.0000 mg | ORAL_TABLET | Freq: Two times a day (BID) | ORAL | 3 refills | Status: AC

## 2024-03-17 ENCOUNTER — Encounter: Payer: Self-pay | Admitting: Family Medicine

## 2024-03-17 ENCOUNTER — Encounter: Payer: Self-pay | Admitting: Urology

## 2024-03-17 ENCOUNTER — Ambulatory Visit (INDEPENDENT_AMBULATORY_CARE_PROVIDER_SITE_OTHER): Admitting: Urology

## 2024-03-17 VITALS — BP 137/82 | HR 67 | Ht 59.0 in | Wt 165.0 lb

## 2024-03-17 DIAGNOSIS — R310 Gross hematuria: Secondary | ICD-10-CM

## 2024-03-17 DIAGNOSIS — N200 Calculus of kidney: Secondary | ICD-10-CM

## 2024-03-17 DIAGNOSIS — N3289 Other specified disorders of bladder: Secondary | ICD-10-CM | POA: Diagnosis not present

## 2024-03-17 LAB — URINALYSIS, ROUTINE W REFLEX MICROSCOPIC
Bilirubin, UA: NEGATIVE
Glucose, UA: NEGATIVE
Ketones, UA: NEGATIVE
Nitrite, UA: NEGATIVE
Protein,UA: NEGATIVE
Specific Gravity, UA: <=1.005 — AB (ref 1.005–1.030)
Urobilinogen, Ur: 0.2 mg/dL (ref 0.2–1.0)
pH, UA: 6.5 (ref 5.0–7.5)

## 2024-03-17 LAB — MICROSCOPIC EXAMINATION: Bacteria, UA: NONE SEEN

## 2024-03-17 MED ORDER — CIPROFLOXACIN HCL 500 MG PO TABS
500.0000 mg | ORAL_TABLET | Freq: Once | ORAL | Status: AC
  Administered 2024-03-17: 500 mg via ORAL

## 2024-03-17 NOTE — Progress Notes (Signed)
 Assessment: 1. Gross hematuria   2. Nephrolithiasis     Plan: I personally reviewed the CT study from 02/27/2024 with results as noted below. Will forward results to Dr. Watt regarding liver and lung lesions. Bladder wash sent for urine cytology today. Cipro  x 1 following cystoscopy. Return to office in 6 weeks.  Chief Complaint:  Chief Complaint  Patient presents with   Hematuria    History of Present Illness:  [name] is a 73 y.o. female who is seen for further evaluation of gross hematuria. She had onset of brown-colored urine in September 2025.  Her symptoms lasted for 2 days.  No dysuria or flank pain. Urinalysis from 01/23/2024 showed large blood. The hematuria resolved.  No further episodes. She has baseline symptoms of frequency, urgency, nocturia, urge incontinence. She has a history of UTIs with her last UTI approximately 2 years ago. She has a history of kidney stones with her last episode 20 years ago. She has a prior history of tobacco use smoking 2-3 packs/day for 25+ years.  She quit 23 years ago.  CT renal stone study from 02/27/2024 showed possible nonobstructing nephrolithiasis of the inferior pole of the right kidney, no ureteral calculi, indeterminate 9 mm mass of the left liver likely reflecting a hemangioma and a 6 mm right lower lobe pulmonary nodule.  She presents today for further evaluation with cystoscopy. She reports 1 episode of gross hematuria approximately 10 days ago.  No further episodes since that time.  Portions of the above documentation were copied from a prior visit for review purposes only.  Past Medical History:  Past Medical History:  Diagnosis Date   Allergy 1979   Arthritis    Bilateral carotid bruits    1-39% bilateral ICA stenosis   Blood transfusion without reported diagnosis    Branch retinal vein occlusion of left eye with macular edema (HCC) 01/09/2018   Cataract 2016   Bilateral cataract surgery 2017    Chicken pox    Closed fracture dislocation of lunate bone of right wrist    1970's   Closed fracture of left wrist 12/13/2018   Formatting of this note might be different from the original. Added automatically from request for surgery [id number]   Diverticulosis of colon 05/16/2015   Dyslipidemia    Genital herpes    GERD (gastroesophageal reflux disease)    Hepatitis B    HSV-2 (herpes simplex virus 2) infection 06/11/2016   Hyperlipidemia 1974   On meds   Hypothyroidism (acquired) 06/11/2016   Kidney stone    NSTEMI (non-ST elevated myocardial infarction) (HCC) 04/15/2018   Troponin 0.58 on 04/15/18   Osteoarthritis 06/11/2016   Osteopenia 11/30/2017   Pancreatitis    yrs ago   Peripheral vascular disease, unspecified (HCC) right subclavian artery stenosis 04/18/2019   PONV (postoperative nausea and vomiting)    Prediabetes 03/11/2020   Pseudophakia of both eyes 02/18/2018   PVD (posterior vitreous detachment), bilateral 02/18/2018   S/P CABG x 4 04/15/18 04/17/2018   LIMA to LAD SVG to DIAGONAL SVG to OM SVG to RCA   Substance abuse (HCC) 1964   Clean and sober since 07/29/1989   Systolic murmur     Past Surgical History:  Past Surgical History:  Procedure Laterality Date   APPENDECTOMY  1972   clavicle repair 1970's right     COLPOCLEISIS N/A 03/19/2023   Procedure: Ladora Alexanders COLPOCLEISIS;  Surgeon: Marilynne Rosaline SAILOR, MD;  Location: [facility];  Service: Gynecology;  Laterality: N/A;   CORONARY ARTERY BYPASS GRAFT N/A 04/17/2018   Procedure: CORONARY ARTERY BYPASS GRAFTING (CABG) TIMES USING LEFT INTERNAL MAMMARY ARTERY TO LAD AND SAPHENOUS VEIN TO DIAG 1, OM1, AND RCA;  Surgeon: Army Dallas NOVAK, MD;  Location: [facility] OR;  Service: Open Heart Surgery;  Laterality: N/A;   CYSTOSCOPY N/A 03/19/2023   Procedure: CYSTOSCOPY;  Surgeon: Marilynne Rosaline SAILOR, MD;  Location: [facility];  Service: Gynecology;  Laterality: N/A;   ENDOVEIN HARVEST OF  GREATER SAPHENOUS VEIN Right 04/17/2018   Procedure: ENDOVEIN HARVEST OF GREATER SAPHENOUS VEIN RIGHT THIGH AND CALF;  Surgeon: Army Dallas NOVAK, MD;  Location: [facility] OR;  Service: Open Heart Surgery;  Laterality: Right;   EYE SURGERY  11/2015   Bilateral cataract surgery   FRACTURE SURGERY  R clavicle fx 1989?   Repair failed   LAPAROSCOPY     LEFT HEART CATH AND CORONARY ANGIOGRAPHY N/A 04/16/2018   Procedure: LEFT HEART CATH AND CORONARY ANGIOGRAPHY;  Surgeon: Verlin Lonni BIRCH, MD;  Location: [facility];  Service: Cardiovascular;  Laterality: N/A;   OVARIAN CYST SURGERY     RECTOCELE REPAIR N/A 03/19/2023   Procedure: LEVATOR PLICATION and PERINEORRHAPHY;  Surgeon: Marilynne Rosaline SAILOR, MD;  Location: [facility];  Service: Gynecology;  Laterality: N/A;   TEE WITHOUT CARDIOVERSION N/A 04/17/2018   Procedure: TRANSESOPHAGEAL ECHOCARDIOGRAM (TEE);  Surgeon: Army Dallas NOVAK, MD;  Location[facility];  Service: Open Heart Surgery;  Laterality: N/A;   TONSILLECTOMY     WRIST SURGERY Left 11/2018   Plate placement.    Allergies:  Allergies  Allergen Reactions   Other Anaphylaxis    CAT Scan dye allergy   Contrast Media [Iodinated Contrast Media]     Dye from salpingogram - caused scarring   Povidone-Iodine Swelling, Dermatitis and Rash    Rash and swelling with betadine   Scopolamine Anxiety and Other (See Comments)    And halluncinations    Family History:  Family History  Problem Relation Age of Onset   Alcoholism Mother    Kidney disease Mother    Kidney Stones Mother    Liver cancer Mother    Cancer Mother    Vision loss Mother    Alcoholism Father    Hyperlipidemia Father    Heart disease Father    Heart failure Father    Alcohol abuse Father    Arthritis Father    Alcoholism Maternal Grandfather    Hearing loss Brother    Heart disease Brother    Hyperlipidemia Brother    Hypertension Brother     Social History:  Social History    Tobacco Use   Smoking status: Former    Current packs/day: 0.00    Average packs/day: 3.0 packs/day for 28.0 years (84.0 ttl pk-yrs)    Types: Cigarettes    Start date: 04/22/1963    Quit date: 04/22/1991    Years since quitting: 32.9   Smokeless tobacco: Never  Vaping Use   Vaping status: Never Used  Substance Use Topics   Alcohol use: Not Currently    Comment: Last drink 07/1989   Drug use: Not Currently    Types: Amphetamines, Barbituates, Benzodiazepines, Crack cocaine, Cocaine, Heroin, LSD, Methaqualone, Psilocybin    Comment: clean and sober  since 1991    ROS: Constitutional:  Negative for fever, chills, weight loss CV: Negative for chest pain, previous MI, hypertension Respiratory:  Negative for shortness of breath, wheezing, sleep apnea, frequent cough  GI:  Negative for nausea, vomiting, bloody stool, GERD  Physical exam: BP 137/82   Pulse 67   Ht 4' 11 (1.499 m)   Wt 165 lb (74.8 kg)   BMI 33.33 kg/m  GENERAL APPEARANCE:  Well appearing, well developed, well nourished, NAD HEENT:  Atraumatic, normocephalic, oropharynx clear NECK:  Supple without lymphadenopathy or thyromegaly ABDOMEN:  Soft, non-tender, no masses EXTREMITIES:  Moves all extremities well, without clubbing, cyanosis, or edema NEUROLOGIC:  Alert and oriented x 3, normal gait, CN II-XII grossly intact MENTAL STATUS:  appropriate BACK:  Non-tender to palpation, No CVAT SKIN:  Warm, dry, and intact  Results: U/A: 0-5 WBCs, 3-10 RBCs  CYSTOSCOPY  Procedure: Flexible cystoscopy  Pre-Operative Diagnosis:  Gross hematuria  Post-Operative Diagnosis: Gross hematuria  Anesthesia: local with lidocaine  gel  Surgical Narrative:  After appropriate informed consent was obtained, the patient was prepped and draped in the usual sterile fashion in the supine position. She was correctly identified and the proper procedure delineated prior to proceeding. Sterile lidocaine  gel was instilled in the  urethra.  The flexible cystoscope was introduced without difficulty.  Findings:  Urethra: Normal  Bladder: Normal; no mucosal lesions seen  Ureteral orifices: normal with clear efflux bilaterally  Additional findings: none  A bladder wash was obtained for cytology.  Pelvic exam was not positive for pelvic prolapse.  She tolerated the procedure well.  A chaperone was present throughout the procedure.

## 2024-03-26 ENCOUNTER — Encounter: Payer: Self-pay | Admitting: Urology

## 2024-03-28 LAB — UROVYSION FISH

## 2024-03-28 LAB — UROV MD:CYTOLOGY W/REFLEX FISH ATYPICAL CYTOLOGY

## 2024-03-31 ENCOUNTER — Encounter: Payer: Self-pay | Admitting: Urology

## 2024-04-01 ENCOUNTER — Ambulatory Visit: Payer: Self-pay | Admitting: Urology

## 2024-04-20 NOTE — Progress Notes (Unsigned)
 [facility] [address], [address] [place], [place] [address] [phone]  Date:  04/23/2024   Name:  Tonda Wiederhold   DOB:  08-22-1950   MRN:  [id number]  PCP:  Watt Harlene BROCKS, MD    Chief Complaint: No chief complaint on file.   History of Present Illness:  Jolana Runkles is a 73 y.o. very pleasant female patient who presents with the following:  Patient seen today to follow-up on her recent CT scan and hematuria I saw her most recently in April  history of NSTEMI and CABG in 2019, hypothyroidism, dyslipidemia, GERD, borderline prediabetes  She had repair of vaginal and uterine prolapse in November 2024  In the interim she was seen in urgent care in September with gross hematuria She was referred to urology for follow-up, seen by Dr. Roseann in October She had a full workup including CT and cystoscopy Cystoscopy was normal  Her CT showed a couple of unrelated findings which need to be followed up on IMPRESSION: 1. No obstructing renal nephrolithiasis identified. Possible nonobstructing nephrolithiasis of the inferior pole of the RIGHT kidney. 2. There is an indeterminate 9 mm mass of the LEFT liver which measures slightly above fluid density. This likely reflects a hemangioma in the absence of history of malignancy. This could be further characterized with dedicated liver MRI with and without contrast if clinically indicated. 3. There is a 6 mm RIGHT lower lobe pulmonary nodule. Non-contrast chest CT at 6-12 months is recommended. If the nodule is stable at time of repeat CT, then future CT at 18-24 months (from today's scan) is considered optional for low-risk patients, but is recommended for high-risk patients. This recommendation follows the consensus statement: Guidelines for Management of Incidental Pulmonary Nodules Detected on CT Images: From the Fleischner Society 2017; Radiology 2017;  284:228-243. 4. Severe atherosclerotic calcifications of the abdominal aorta and its branches. Likely severe stenosis of the origin of the SMA due to dense atherosclerotic calcifications. 5. Mild circumferential wall prominence of the sigmoid colon likely reflect a degree of underlying chronic diverticulitis. Recommend correlation with colon cancer screening history.   Due for tetanus booster She did a Cologuard in 2023  Discussed the use of AI scribe software for clinical note transcription with the patient, who gave verbal consent to proceed.  History of Present Illness    Patient Active Problem List   Diagnosis Date Noted   Gross hematuria 02/21/2024   Steal syndrome, subclavian on the right 09/12/2022   Dermatochalasis of both upper eyelids 07/27/2022   Cystoid macular edema of left eye 07/27/2022   Astigmatism with presbyopia, bilateral 07/27/2022   Arcus senilis of both eyes 07/27/2022   Poor dentition 12/28/2021   Arthritis    Chicken pox    Genital herpes    Hepatitis B    Hypothyroidism    Kidney stone    Prediabetes 03/11/2020   Peripheral vascular disease, unspecified (HCC) right subclavian artery stenosis 04/18/2019   Closed fracture of left wrist 12/13/2018   S/P CABG x 4 04/15/18 04/17/2018   NSTEMI (non-ST elevated myocardial infarction) (HCC) 04/15/2018   Bilateral carotid bruits    Systolic murmur    Pseudophakia of both eyes 02/18/2018   PVD (posterior vitreous detachment), bilateral 02/18/2018   Branch retinal vein occlusion of left eye with macular edema (HCC) 01/09/2018   Osteopenia 11/30/2017   GERD (gastroesophageal reflux disease) 06/11/2016   Osteoarthritis 06/11/2016   Hypothyroidism (acquired)  06/11/2016   Dyslipidemia 06/11/2016   HSV-2 (herpes simplex virus 2) infection 06/11/2016   Diverticulosis of colon 05/16/2015    Past Medical History:  Diagnosis Date   Allergy 1979   Arthritis    Bilateral carotid bruits    1-39% bilateral  ICA stenosis   Blood transfusion without reported diagnosis    Branch retinal vein occlusion of left eye with macular edema (HCC) 01/09/2018   Cataract 2016   Bilateral cataract surgery 2017   Chicken pox    Closed fracture dislocation of lunate bone of right wrist    1970's   Closed fracture of left wrist 12/13/2018   Formatting of this note might be different from the original. Added automatically from request for surgery [id number]   Diverticulosis of colon 05/16/2015   Dyslipidemia    Genital herpes    GERD (gastroesophageal reflux disease)    Hepatitis B    HSV-2 (herpes simplex virus 2) infection 06/11/2016   Hyperlipidemia 1974   On meds   Hypothyroidism (acquired) 06/11/2016   Kidney stone    NSTEMI (non-ST elevated myocardial infarction) (HCC) 04/15/2018   Troponin 0.58 on 04/15/18   Osteoarthritis 06/11/2016   Osteopenia 11/30/2017   Pancreatitis    yrs ago   Peripheral vascular disease, unspecified (HCC) right subclavian artery stenosis 04/18/2019   PONV (postoperative nausea and vomiting)    Prediabetes 03/11/2020   Pseudophakia of both eyes 02/18/2018   PVD (posterior vitreous detachment), bilateral 02/18/2018   S/P CABG x 4 04/15/18 04/17/2018   LIMA to LAD SVG to DIAGONAL SVG to OM SVG to RCA   Substance abuse (HCC) 1964   Clean and sober since 07/29/1989   Systolic murmur     Past Surgical History:  Procedure Laterality Date   APPENDECTOMY  1972   clavicle repair 1970's right     COLPOCLEISIS N/A 03/19/2023   Procedure: Ladora Alexanders COLPOCLEISIS;  Surgeon: Marilynne Rosaline SAILOR, MD;  Location: [facility];  Service: Gynecology;  Laterality: N/A;   CORONARY ARTERY BYPASS GRAFT N/A 04/17/2018   Procedure: CORONARY ARTERY BYPASS GRAFTING (CABG) TIMES USING LEFT INTERNAL MAMMARY ARTERY TO LAD AND SAPHENOUS VEIN TO DIAG 1, OM1, AND RCA;  Surgeon: Army Dallas NOVAK, MD;  Location: [facility] OR;  Service: Open Heart Surgery;  Laterality: N/A;   CYSTOSCOPY N/A  03/19/2023   Procedure: CYSTOSCOPY;  Surgeon: Marilynne Rosaline SAILOR, MD;  Location: [facility];  Service: Gynecology;  Laterality: N/A;   ENDOVEIN HARVEST OF GREATER SAPHENOUS VEIN Right 04/17/2018   Procedure: ENDOVEIN HARVEST OF GREATER SAPHENOUS VEIN RIGHT THIGH AND CALF;  Surgeon: Army Dallas NOVAK, MD;  Location: [facility] OR;  Service: Open Heart Surgery;  Laterality: Right;   EYE SURGERY  11/2015   Bilateral cataract surgery   FRACTURE SURGERY  R clavicle fx 1989?   Repair failed   LAPAROSCOPY     LEFT HEART CATH AND CORONARY ANGIOGRAPHY N/A 04/16/2018   Procedure: LEFT HEART CATH AND CORONARY ANGIOGRAPHY;  Surgeon: Verlin Lonni BIRCH, MD;  Location: [facility];  Service: Cardiovascular;  Laterality: N/A;   OVARIAN CYST SURGERY     RECTOCELE REPAIR N/A 03/19/2023   Procedure: LEVATOR PLICATION and PERINEORRHAPHY;  Surgeon: Marilynne Rosaline SAILOR, MD;  Location: [facility];  Service: Gynecology;  Laterality: N/A;   TEE WITHOUT CARDIOVERSION N/A 04/17/2018   Procedure: TRANSESOPHAGEAL ECHOCARDIOGRAM (TEE);  Surgeon: Army Dallas NOVAK, MD;  Location: [facility] OR;  Service: Open Heart Surgery;  Laterality: N/A;  TONSILLECTOMY     WRIST SURGERY Left 11/2018   Plate placement.    Social History   Tobacco Use   Smoking status: Former    Current packs/day: 0.00    Average packs/day: 3.0 packs/day for 28.0 years (84.0 ttl pk-yrs)    Types: Cigarettes    Start date: 04/22/1963    Quit date: 04/22/1991    Years since quitting: 33.0   Smokeless tobacco: Never  Vaping Use   Vaping status: Never Used  Substance Use Topics   Alcohol use: Not Currently    Comment: Last drink 07/1989   Drug use: Not Currently    Types: Amphetamines, Barbituates, Benzodiazepines, Crack cocaine, Cocaine, Heroin, LSD, Methaqualone, Psilocybin    Comment: clean and sober  since 61    Family History  Problem Relation Age of Onset   Alcoholism Mother    Kidney disease  Mother    Kidney Stones Mother    Liver cancer Mother    Cancer Mother    Vision loss Mother    Alcoholism Father    Hyperlipidemia Father    Heart disease Father    Heart failure Father    Alcohol abuse Father    Arthritis Father    Alcoholism Maternal Grandfather    Hearing loss Brother    Heart disease Brother    Hyperlipidemia Brother    Hypertension Brother     Allergies  Allergen Reactions   Other Anaphylaxis    CAT Scan dye allergy   Contrast Media [Iodinated Contrast Media]     Dye from salpingogram - caused scarring   Povidone-Iodine Swelling, Dermatitis and Rash    Rash and swelling with betadine   Scopolamine Anxiety and Other (See Comments)    And halluncinations    Medication list has been reviewed and updated.  Current Outpatient Medications on File Prior to Visit  Medication Sig Dispense Refill   acetaminophen  (TYLENOL ) 500 MG tablet Take 500 mg by mouth every 6 (six) hours as needed for mild pain or moderate pain.     acyclovir  (ZOVIRAX ) 400 MG tablet Take 1 tablet (400 mg total) by mouth 2 (two) times daily. 180 tablet 3   Ascorbic Acid (VITAMIN C WITH ROSE HIPS) 500 MG tablet Take 500 mg by mouth daily.     aspirin  EC 81 MG EC tablet Take 1 tablet (81 mg total) by mouth daily.     atorvastatin  (LIPITOR ) 80 MG tablet Take 1 tablet (80 mg total) by mouth daily. 90 tablet 3   cholecalciferol (VITAMIN D3) 25 MCG (1000 UNIT) tablet Take 2,000 Units by mouth daily.     estradiol  (ESTRACE  VAGINAL) 0.1 MG/GM vaginal cream Place 0.5 Applicatorfuls vaginally daily. 45.2 g 5   ezetimibe  (ZETIA ) 10 MG tablet TAKE 1 TABLET BY MOUTH DAILY 90 tablet 2   levothyroxine  (SYNTHROID ) 88 MCG tablet Take 1 tablet (88 mcg total) by mouth daily before breakfast. 90 tablet 3   loratadine (CLARITIN) 10 MG tablet Take 10 mg by mouth daily.     metoprolol  tartrate (LOPRESSOR ) 25 MG tablet Take 0.5 tablets (12.5 mg total) by mouth 2 (two) times daily. 90 tablet 3   Multiple Vitamin  (MULTIVITAMIN) tablet Take 1 tablet by mouth every evening.     omeprazole  (PRILOSEC) 20 MG capsule Take 1 capsule (20 mg total) by mouth daily. 90 capsule 3   Zinc Sulfate (ZINC 15 PO) Take 1 tablet by mouth daily.     No current facility-administered medications on file prior  to visit.    Review of Systems:  As per HPI- otherwise negative.   Physical Examination: There were no vitals filed for this visit. There were no vitals filed for this visit. There is no height or weight on file to calculate BMI. Ideal Body Weight:    GEN: no acute distress. HEENT: Atraumatic, Normocephalic.  Ears and Nose: No external deformity. CV: RRR, No M/G/R. No JVD. No thrill. No extra heart sounds. PULM: CTA B, no wheezes, crackles, rhonchi. No retractions. No resp. distress. No accessory muscle use. ABD: S, NT, ND, +BS. No rebound. No HSM. EXTR: No c/c/e PSYCH: Normally interactive. Conversant.    Assessment and Plan: No diagnosis found.  Assessment & Plan   Signed Harlene Schroeder, MD

## 2024-04-20 NOTE — Patient Instructions (Incomplete)
 It was good to see you today Already recommend the shingles vaccine series and 1 dose of RSV vaccine at your pharmacy

## 2024-04-22 ENCOUNTER — Ambulatory Visit: Admitting: Urology

## 2024-04-23 ENCOUNTER — Ambulatory Visit: Admitting: Family Medicine

## 2024-04-23 ENCOUNTER — Encounter: Payer: Self-pay | Admitting: Family Medicine

## 2024-04-23 ENCOUNTER — Ambulatory Visit: Payer: Self-pay | Admitting: Family Medicine

## 2024-04-23 VITALS — BP 122/66 | HR 66 | Temp 97.9°F | Resp 18 | Ht 59.0 in | Wt 166.2 lb

## 2024-04-23 DIAGNOSIS — R911 Solitary pulmonary nodule: Secondary | ICD-10-CM | POA: Diagnosis not present

## 2024-04-23 DIAGNOSIS — Z5181 Encounter for therapeutic drug level monitoring: Secondary | ICD-10-CM

## 2024-04-23 DIAGNOSIS — R310 Gross hematuria: Secondary | ICD-10-CM | POA: Diagnosis not present

## 2024-04-23 DIAGNOSIS — Z8619 Personal history of other infectious and parasitic diseases: Secondary | ICD-10-CM | POA: Diagnosis not present

## 2024-04-23 LAB — COMPREHENSIVE METABOLIC PANEL WITH GFR
ALT: 17 U/L (ref 0–35)
AST: 20 U/L (ref 0–37)
Albumin: 4.3 g/dL (ref 3.5–5.2)
Alkaline Phosphatase: 75 U/L (ref 39–117)
BUN: 15 mg/dL (ref 6–23)
CO2: 29 meq/L (ref 19–32)
Calcium: 9.6 mg/dL (ref 8.4–10.5)
Chloride: 100 meq/L (ref 96–112)
Creatinine, Ser: 0.66 mg/dL (ref 0.40–1.20)
GFR: 85.67 mL/min (ref >60.00)
Glucose, Bld: 106 mg/dL — ABNORMAL HIGH (ref 70–99)
Potassium: 4.5 meq/L (ref 3.5–5.1)
Sodium: 135 meq/L (ref 135–145)
Total Bilirubin: 0.5 mg/dL (ref 0.2–1.2)
Total Protein: 6.7 g/dL (ref 6.0–8.3)

## 2024-04-23 LAB — POCT URINALYSIS DIP (MANUAL ENTRY)
Bilirubin, UA: NEGATIVE
Blood, UA: NEGATIVE
Glucose, UA: NEGATIVE mg/dL
Ketones, POC UA: NEGATIVE mg/dL
Leukocytes, UA: NEGATIVE
Nitrite, UA: NEGATIVE
Protein Ur, POC: NEGATIVE mg/dL
Spec Grav, UA: <=1.005 — AB (ref 1.010–1.025)
Urobilinogen, UA: 0.2 U/dL
pH, UA: 6.5 (ref 5.0–8.0)

## 2024-04-23 LAB — CBC
HCT: 40.2 % (ref 36.0–46.0)
Hemoglobin: 13.8 g/dL (ref 12.0–15.0)
MCHC: 34.2 g/dL (ref 30.0–36.0)
MCV: 99.3 fl (ref 78.0–100.0)
Platelets: 379 K/uL (ref 150.0–400.0)
RBC: 4.05 Mil/uL (ref 3.87–5.11)
RDW: 12 % (ref 11.5–15.5)
WBC: 7.5 K/uL (ref 4.0–10.5)

## 2024-04-24 LAB — HEPATITIS B SURFACE ANTIBODY, QUANTITATIVE: Hep B S AB Quant (Post): <5 m[IU]/mL — ABNORMAL LOW (ref >=10)

## 2024-04-24 LAB — HEPATITIS B CORE ANTIBODY, TOTAL: Hep B Core Total Ab: REACTIVE — AB

## 2024-04-29 ENCOUNTER — Encounter: Payer: Self-pay | Admitting: Family Medicine

## 2024-05-26 ENCOUNTER — Encounter: Payer: Self-pay | Admitting: *Deleted

## 2024-05-27 ENCOUNTER — Encounter: Payer: Self-pay | Admitting: Family Medicine

## 2024-05-27 ENCOUNTER — Ambulatory Visit (HOSPITAL_BASED_OUTPATIENT_CLINIC_OR_DEPARTMENT_OTHER)
Admission: RE | Admit: 2024-05-27 | Discharge: 2024-05-27 | Disposition: A | Discharge status: Home / Self Care | Source: Ambulatory Visit | Attending: Family Medicine | Admitting: Family Medicine

## 2024-05-27 DIAGNOSIS — Z8619 Personal history of other infectious and parasitic diseases: Secondary | ICD-10-CM | POA: Diagnosis present

## 2024-06-02 ENCOUNTER — Other Ambulatory Visit: Payer: Self-pay | Admitting: Family Medicine

## 2024-06-02 DIAGNOSIS — E034 Atrophy of thyroid (acquired): Secondary | ICD-10-CM

## 2024-06-11 ENCOUNTER — Encounter: Payer: Self-pay | Admitting: Urology
